# Patient Record
Sex: Female | Born: 1935 | Race: Black or African American | Hispanic: No | Smoking: Never smoker
Health system: Southern US, Community
[De-identification: ages and names within clinical notes are randomized; demographics above are authoritative.]

## PROBLEM LIST (undated history)

## (undated) DIAGNOSIS — R0602 Shortness of breath: Secondary | ICD-10-CM

## (undated) DIAGNOSIS — J069 Acute upper respiratory infection, unspecified: Secondary | ICD-10-CM

## (undated) DIAGNOSIS — R079 Chest pain, unspecified: Secondary | ICD-10-CM

## (undated) DIAGNOSIS — I739 Peripheral vascular disease, unspecified: Secondary | ICD-10-CM

## (undated) DIAGNOSIS — R51 Headache: Secondary | ICD-10-CM

## (undated) DIAGNOSIS — E049 Nontoxic goiter, unspecified: Secondary | ICD-10-CM

## (undated) DIAGNOSIS — R52 Pain, unspecified: Secondary | ICD-10-CM

## (undated) DIAGNOSIS — I10 Essential (primary) hypertension: Secondary | ICD-10-CM

## (undated) DIAGNOSIS — R2681 Unsteadiness on feet: Secondary | ICD-10-CM

## (undated) DIAGNOSIS — I209 Angina pectoris, unspecified: Secondary | ICD-10-CM

## (undated) DIAGNOSIS — I219 Acute myocardial infarction, unspecified: Secondary | ICD-10-CM

## (undated) DIAGNOSIS — H409 Unspecified glaucoma: Secondary | ICD-10-CM

## (undated) DIAGNOSIS — H93A9 Pulsatile tinnitus, unspecified ear: Secondary | ICD-10-CM

## (undated) DIAGNOSIS — E785 Hyperlipidemia, unspecified: Secondary | ICD-10-CM

## (undated) DIAGNOSIS — R002 Palpitations: Secondary | ICD-10-CM

## (undated) DIAGNOSIS — I82409 Acute embolism and thrombosis of unspecified deep veins of unspecified lower extremity: Secondary | ICD-10-CM

## (undated) DIAGNOSIS — I639 Cerebral infarction, unspecified: Secondary | ICD-10-CM

## (undated) DIAGNOSIS — I272 Pulmonary hypertension, unspecified: Secondary | ICD-10-CM

## (undated) DIAGNOSIS — I951 Orthostatic hypotension: Secondary | ICD-10-CM

## (undated) HISTORY — DX: Pulsatile tinnitus, unspecified ear: H93.A9

## (undated) HISTORY — PX: VASCULAR SURGERY: SHX849

## (undated) HISTORY — DX: Unsteadiness on feet: R26.81

## (undated) HISTORY — DX: Orthostatic hypotension: I95.1

## (undated) HISTORY — DX: Essential (primary) hypertension: I10

## (undated) HISTORY — DX: Acute myocardial infarction, unspecified: I21.9

## (undated) HISTORY — DX: Palpitations: R00.2

## (undated) HISTORY — DX: Hyperlipidemia, unspecified: E78.5

## (undated) HISTORY — DX: Angina pectoris, unspecified: I20.9

## (undated) HISTORY — DX: Acute embolism and thrombosis of unspecified deep veins of unspecified lower extremity: I82.409

## (undated) HISTORY — DX: Chest pain, unspecified: R07.9

## (undated) HISTORY — PX: EYE SURGERY: SHX253

## (undated) HISTORY — PX: APPENDECTOMY: SHX54

## (undated) HISTORY — DX: Peripheral vascular disease, unspecified: I73.9

## (undated) HISTORY — DX: Pulmonary hypertension, unspecified: I27.20

---

## 1986-01-05 HISTORY — PX: BACK SURGERY: SHX140

## 2004-06-11 ENCOUNTER — Encounter: Admission: RE | Admit: 2004-06-11 | Discharge: 2004-06-11 | Payer: Self-pay | Admitting: Family Medicine

## 2006-03-30 ENCOUNTER — Encounter: Admission: RE | Admit: 2006-03-30 | Discharge: 2006-05-13 | Payer: Self-pay | Admitting: Family Medicine

## 2006-07-07 ENCOUNTER — Other Ambulatory Visit: Admission: RE | Admit: 2006-07-07 | Discharge: 2006-07-07 | Payer: Self-pay | Admitting: Family Medicine

## 2006-08-19 ENCOUNTER — Encounter: Admission: RE | Admit: 2006-08-19 | Discharge: 2006-08-19 | Payer: Self-pay | Admitting: Family Medicine

## 2007-07-19 ENCOUNTER — Encounter: Admission: RE | Admit: 2007-07-19 | Discharge: 2007-07-19 | Payer: Self-pay | Admitting: Cardiology

## 2007-07-22 ENCOUNTER — Inpatient Hospital Stay (HOSPITAL_BASED_OUTPATIENT_CLINIC_OR_DEPARTMENT_OTHER): Admission: RE | Admit: 2007-07-22 | Discharge: 2007-07-22 | Payer: Self-pay | Admitting: Cardiology

## 2007-09-22 ENCOUNTER — Emergency Department (HOSPITAL_COMMUNITY): Admission: EM | Admit: 2007-09-22 | Discharge: 2007-09-22 | Payer: Self-pay | Admitting: Family Medicine

## 2008-09-12 ENCOUNTER — Encounter: Admission: RE | Admit: 2008-09-12 | Discharge: 2008-09-12 | Payer: Self-pay | Admitting: Family Medicine

## 2008-10-04 ENCOUNTER — Ambulatory Visit (HOSPITAL_COMMUNITY): Admission: RE | Admit: 2008-10-04 | Discharge: 2008-10-04 | Payer: Self-pay | Admitting: Interventional Cardiology

## 2009-06-01 ENCOUNTER — Emergency Department (HOSPITAL_COMMUNITY): Admission: EM | Admit: 2009-06-01 | Discharge: 2009-06-02 | Payer: Self-pay | Admitting: Emergency Medicine

## 2009-09-18 ENCOUNTER — Encounter: Admission: RE | Admit: 2009-09-18 | Discharge: 2009-09-18 | Payer: Self-pay | Admitting: Family Medicine

## 2009-10-04 ENCOUNTER — Encounter: Admission: RE | Admit: 2009-10-04 | Discharge: 2009-10-04 | Payer: Self-pay | Admitting: Family Medicine

## 2010-01-05 HISTORY — PX: CARDIAC CATHETERIZATION: SHX172

## 2010-01-27 ENCOUNTER — Encounter: Payer: Self-pay | Admitting: Family Medicine

## 2010-03-20 ENCOUNTER — Ambulatory Visit (HOSPITAL_COMMUNITY)
Admission: RE | Admit: 2010-03-20 | Discharge: 2010-03-20 | Disposition: A | Discharge status: Home / Self Care | Payer: Medicare Other | Source: Ambulatory Visit | Attending: Interventional Cardiology | Admitting: Interventional Cardiology

## 2010-03-20 DIAGNOSIS — I2510 Atherosclerotic heart disease of native coronary artery without angina pectoris: Secondary | ICD-10-CM | POA: Insufficient documentation

## 2010-03-20 DIAGNOSIS — I10 Essential (primary) hypertension: Secondary | ICD-10-CM | POA: Insufficient documentation

## 2010-03-20 DIAGNOSIS — I70219 Atherosclerosis of native arteries of extremities with intermittent claudication, unspecified extremity: Secondary | ICD-10-CM | POA: Insufficient documentation

## 2010-03-20 DIAGNOSIS — T82898A Other specified complication of vascular prosthetic devices, implants and grafts, initial encounter: Secondary | ICD-10-CM | POA: Insufficient documentation

## 2010-03-20 DIAGNOSIS — Y849 Medical procedure, unspecified as the cause of abnormal reaction of the patient, or of later complication, without mention of misadventure at the time of the procedure: Secondary | ICD-10-CM | POA: Insufficient documentation

## 2010-03-20 DIAGNOSIS — I252 Old myocardial infarction: Secondary | ICD-10-CM | POA: Insufficient documentation

## 2010-03-20 LAB — POCT ACTIVATED CLOTTING TIME
Activated Clotting Time: 229 seconds
Activated Clotting Time: 240 seconds
Activated Clotting Time: 199 seconds
Activated Clotting Time: 187 seconds
Activated Clotting Time: 164 seconds

## 2010-03-24 LAB — CBC
HCT: 39.1 % (ref 36.0–46.0)
Hemoglobin: 13.1 g/dL (ref 12.0–15.0)
MCHC: 33.5 g/dL (ref 30.0–36.0)
MCV: 86.4 fL (ref 78.0–100.0)
Platelets: 310 10*3/uL (ref 150–400)
RBC: 4.53 MIL/uL (ref 3.87–5.11)
RDW: 15.5 % (ref 11.5–15.5)
WBC: 5.4 10*3/uL (ref 4.0–10.5)

## 2010-03-24 LAB — BASIC METABOLIC PANEL
BUN: 7 mg/dL (ref 6–23)
CO2: 28 mEq/L (ref 19–32)
Calcium: 9 mg/dL (ref 8.4–10.5)
Chloride: 108 mEq/L (ref 96–112)
Creatinine, Ser: 0.8 mg/dL (ref 0.4–1.2)
GFR calc Af Amer: >60 mL/min (ref >60)
GFR calc non Af Amer: >60 mL/min (ref >60)
Glucose, Bld: 101 mg/dL — ABNORMAL HIGH (ref 70–99)
Potassium: 3.5 mEq/L (ref 3.5–5.1)
Sodium: 141 mEq/L (ref 135–145)

## 2010-03-24 LAB — GRAM STAIN

## 2010-03-24 LAB — PROTEIN AND GLUCOSE, CSF
Glucose, CSF: 61 mg/dL (ref 43–76)
Total  Protein, CSF: 35 mg/dL (ref 15–45)

## 2010-03-24 LAB — DIFFERENTIAL
Basophils Absolute: 0 10*3/uL (ref 0.0–0.1)
Basophils Relative: 0 % (ref 0–1)
Eosinophils Absolute: 0.1 10*3/uL (ref 0.0–0.7)
Eosinophils Relative: 1 % (ref 0–5)
Lymphocytes Relative: 21 % (ref 12–46)
Lymphs Abs: 1.1 10*3/uL (ref 0.7–4.0)
Monocytes Absolute: 0.4 10*3/uL (ref 0.1–1.0)
Monocytes Relative: 7 % (ref 3–12)
Neutro Abs: 3.9 10*3/uL (ref 1.7–7.7)
Neutrophils Relative %: 71 % (ref 43–77)

## 2010-03-24 LAB — SEDIMENTATION RATE: Sed Rate: 23 mm/hr — ABNORMAL HIGH (ref 0–22)

## 2010-03-24 LAB — CSF CELL COUNT WITH DIFFERENTIAL
RBC Count, CSF: 1 /mm3 — ABNORMAL HIGH
RBC Count, CSF: 39 /mm3 — ABNORMAL HIGH
Tube #: 1
Tube #: 4
WBC, CSF: 1 /mm3 (ref 0–5)
WBC, CSF: 2 /mm3 (ref 0–5)

## 2010-03-24 LAB — PROTIME-INR
INR: 1.05 (ref 0.00–1.49)
Prothrombin Time: 13.6 seconds (ref 11.6–15.2)

## 2010-03-24 LAB — APTT: aPTT: 33 seconds (ref 24–37)

## 2010-03-24 LAB — CSF CULTURE W GRAM STAIN
Culture: NO GROWTH
Gram Stain: NONE SEEN

## 2010-03-24 LAB — POCT CARDIAC MARKERS
CKMB, poc: 1.7 ng/mL (ref 1.0–8.0)
Myoglobin, poc: 90.8 ng/mL (ref 12–200)
Troponin i, poc: <0.05 ng/mL (ref 0.00–0.09)

## 2010-04-09 NOTE — Procedures (Signed)
NAME:  Yvonne Tran, Yvonne Tran NO.:  [id number]  MEDICAL RECORD NO.:  [id number]           PATIENT TYPE:  O  LOCATION:  [facility]                         FACILITY:  [facility]  PHYSICIAN:  Corky Crafts, MDDATE OF BIRTH:  09-16-1934  DATE OF PROCEDURE:  03/20/2010 DATE OF DISCHARGE:  03/20/2010                   PERIPHERAL VASCULAR INVASIVE PROCEDURE   PRIMARY CARE PHYSICIAN:  Pam Drown, MD  PROCEDURES PERFORMED:  Left heart catheterization, left ventriculogram, coronary angiogram, abdominal aortogram, pelvic angiogram, bilateral lower extremity runoff, selective angiogram of left common iliac artery, PTA/stent of the left external iliac artery.  PROCEDURE:  The patient was brought to the [facility].  She was prepped and draped in usual sterile fashion.  Her right groin was infiltrated with 1% lidocaine.  A 5-French sheath was placed into the right common femoral artery using the modified Seldinger technique.  Left coronary artery angiography was performed using a JL4.0 catheter.  Catheter was advanced to the vessel ostium under fluoroscopic guidance.  Digital angiography was performed in multiple projections using hand injection of contrast.  Right coronary artery angiography was performed using a JR- 4 catheter in similar fashion.  A pigtail catheter was advanced to the ascending aorta and across the aortic valve under fluoroscopic guidance. Power injection of contrast was performed in the RAO projection to image the left ventricle.  Catheter was pulled back in a continuous hemodynamic pressure monitoring, was withdrawn to the abdominal aorta. Power injection of contrast was performed there.  The catheter was then pulled back to the aortoiliac bifurcation and power injection of contrast was performed to image the pelvic vasculature.  Subsequently lower extremity runoff was performed through this catheter.  The cath was removed, 5-French sheath was changed out for a  6-French destination sheath after access was obtained over the aortoiliac bifurcation into the left iliac system.  A selective angiogram from the left common iliac was performed to see the prior stent in the left external iliac, the intervention was then performed.  Please see below for details.  The sheath was removed using manual compression.  FINDINGS:  Left main coronary tract was widely patent. Left circumflex was a large vessel, proximally the OM1 was large and appeared angiographically normal.  The remainder of the circumflex is medium-sized and widely patent. Left anterior descending was a large vessel with mild irregularities. The first diagonal was medium-sized, second diagonal was small.  The entire LAD system appeared widely patent. The right coronary artery was a large dominant vessel and appeared angiographically normal.  Posterior descending artery and posterolateral artery where medium-sized vessel and widely patent. Left ventriculogram showed normal left ventricular function.  HEMODYNAMICS:  Left ventricular pressure 182/14, LVEDP of 25 mmHg. Aortic pressure 180/81 with a mean aortic pressure of 122 mmHg.  Left ventricular ejection fraction estimated at 60%. Abdominal aortogram showed no abdominal aortic aneurysm for bilateral single renal arteries, both of which were widely patent. The pelvic angiogram showed that the aortoiliac bifurcation was widely patent. The right common iliac and internal iliac arteries are widely patent. Left common internal iliac artery was widely patent.  In the left external iliac artery, there was 90% in-stent restenosis in the prior stent.  Just proximal  to the stent, there was 80% stenosis in the external iliac artery.  The bilateral common femoral arteries are patent at the left common femoral bifurcation into the SFA, and profunda femoral, there is some mild atherosclerosis.  Bilaterally, the SFA appear widely patent.  Bilateral  popliteal arteries are patent. Below the right knee, the right anterior tibial is patent.  The right posterior tibial and peroneal are diseased. Below the left knee, there appears to be three-vessel runoff.  INTERVENTIONAL NARRATIVE:  A 6-French Destination sheath was used to cross over the aortoiliac bifurcation.  A Versacore wire was placed across the stenosis in the left external iliac.  The diseased area was dilated with a 7 x 4 Armada balloon to 8 atmospheres, a 9 x 40 Absolute stent was then deployed and postdilated with the same 7 x 4 Armada balloon.  There was some overlap with the prior stent.  There was no residual stenosis.  IMPRESSION: 1. In-stent restenosis of the left external iliac stent. 2. Successful re-stent with a 9 x 40 Absolute stent postdilated to     greater than 7 atmospheres in diameter. 3. No significant coronary artery disease. 4. Normal left ventricular function. 5. No abdominal aortic aneurysm. 6. Mildly increased left ventricular end-diastolic pressure. 7. No other significant lower extremity arterial disease.  There is     some moderate disease below the right knee.  RECOMMENDATIONS:  Continue aspirin and Plavix for a minimum of 30 days, likely longer.  She may be able to be discharged later today after bedrest is over.  Continue aggressive secondary prevention.     Corky Crafts, MD     JSV/MEDQ  D:  03/27/2010  T:  03/28/2010  Job:  [id number]  Electronically Signed by Lance Muss MD on 04/09/2010 09:11:22 AM

## 2010-04-19 ENCOUNTER — Emergency Department (HOSPITAL_COMMUNITY)
Admission: EM | Admit: 2010-04-19 | Discharge: 2010-04-20 | Disposition: A | Discharge status: Home / Self Care | Payer: Medicare Other | Attending: Emergency Medicine | Admitting: Emergency Medicine

## 2010-04-19 DIAGNOSIS — I739 Peripheral vascular disease, unspecified: Secondary | ICD-10-CM | POA: Insufficient documentation

## 2010-04-19 DIAGNOSIS — M549 Dorsalgia, unspecified: Secondary | ICD-10-CM | POA: Insufficient documentation

## 2010-04-19 DIAGNOSIS — Z79899 Other long term (current) drug therapy: Secondary | ICD-10-CM | POA: Insufficient documentation

## 2010-04-19 DIAGNOSIS — H543 Unqualified visual loss, both eyes: Secondary | ICD-10-CM | POA: Insufficient documentation

## 2010-04-19 DIAGNOSIS — R071 Chest pain on breathing: Secondary | ICD-10-CM | POA: Insufficient documentation

## 2010-04-19 DIAGNOSIS — Z8673 Personal history of transient ischemic attack (TIA), and cerebral infarction without residual deficits: Secondary | ICD-10-CM | POA: Insufficient documentation

## 2010-04-19 DIAGNOSIS — R0602 Shortness of breath: Secondary | ICD-10-CM | POA: Insufficient documentation

## 2010-04-19 DIAGNOSIS — M25519 Pain in unspecified shoulder: Secondary | ICD-10-CM | POA: Insufficient documentation

## 2010-04-19 DIAGNOSIS — I10 Essential (primary) hypertension: Secondary | ICD-10-CM | POA: Insufficient documentation

## 2010-04-19 DIAGNOSIS — G8929 Other chronic pain: Secondary | ICD-10-CM | POA: Insufficient documentation

## 2010-04-19 DIAGNOSIS — Z7982 Long term (current) use of aspirin: Secondary | ICD-10-CM | POA: Insufficient documentation

## 2010-04-19 DIAGNOSIS — I2510 Atherosclerotic heart disease of native coronary artery without angina pectoris: Secondary | ICD-10-CM | POA: Insufficient documentation

## 2010-04-20 ENCOUNTER — Emergency Department (HOSPITAL_COMMUNITY): Payer: Medicare Other

## 2010-04-20 LAB — CBC
HCT: 35.9 % — ABNORMAL LOW (ref 36.0–46.0)
Hemoglobin: 12.1 g/dL (ref 12.0–15.0)
MCH: 28.2 pg (ref 26.0–34.0)
MCHC: 33.7 g/dL (ref 30.0–36.0)
MCV: 83.7 fL (ref 78.0–100.0)
Platelets: 293 10*3/uL (ref 150–400)
RBC: 4.29 MIL/uL (ref 3.87–5.11)
RDW: 15.2 % (ref 11.5–15.5)
WBC: 7 10*3/uL (ref 4.0–10.5)

## 2010-04-20 LAB — POCT I-STAT, CHEM 8
BUN: 16 mg/dL (ref 6–23)
Calcium, Ion: 1.08 mmol/L — ABNORMAL LOW (ref 1.12–1.32)
Chloride: 107 mEq/L (ref 96–112)
Creatinine, Ser: 1 mg/dL (ref 0.4–1.2)
Glucose, Bld: 105 mg/dL — ABNORMAL HIGH (ref 70–99)
HCT: 37 % (ref 36.0–46.0)
Hemoglobin: 12.6 g/dL (ref 12.0–15.0)
Potassium: 3.7 mEq/L (ref 3.5–5.1)
Sodium: 141 mEq/L (ref 135–145)
TCO2: 25 mmol/L (ref 0–100)

## 2010-04-20 LAB — POCT CARDIAC MARKERS
CKMB, poc: 1.6 ng/mL (ref 1.0–8.0)
Myoglobin, poc: 118 ng/mL (ref 12–200)
Troponin i, poc: <0.05 ng/mL (ref 0.00–0.09)

## 2010-04-20 LAB — DIFFERENTIAL
Basophils Absolute: 0 10*3/uL (ref 0.0–0.1)
Basophils Relative: 1 % (ref 0–1)
Eosinophils Absolute: 0.1 10*3/uL (ref 0.0–0.7)
Eosinophils Relative: 2 % (ref 0–5)
Lymphocytes Relative: 35 % (ref 12–46)
Lymphs Abs: 2.5 10*3/uL (ref 0.7–4.0)
Monocytes Absolute: 0.7 10*3/uL (ref 0.1–1.0)
Monocytes Relative: 11 % (ref 3–12)
Neutro Abs: 3.6 10*3/uL (ref 1.7–7.7)
Neutrophils Relative %: 51 % (ref 43–77)

## 2010-04-20 LAB — CK TOTAL AND CKMB (NOT AT ARMC)
CK, MB: 1.8 ng/mL (ref 0.3–4.0)
Relative Index: 0.8 (ref 0.0–2.5)
Total CK: 232 U/L — ABNORMAL HIGH (ref 7–177)

## 2010-04-20 LAB — TROPONIN I: Troponin I: 0.01 ng/mL (ref 0.00–0.06)

## 2010-04-20 LAB — PROTIME-INR
INR: 0.96 (ref 0.00–1.49)
Prothrombin Time: 13 seconds (ref 11.6–15.2)

## 2010-04-20 LAB — APTT: aPTT: 35 seconds (ref 24–37)

## 2010-04-22 ENCOUNTER — Ambulatory Visit: Payer: Medicare Other | Attending: Family Medicine | Admitting: Physical Therapy

## 2010-04-22 DIAGNOSIS — M6281 Muscle weakness (generalized): Secondary | ICD-10-CM | POA: Insufficient documentation

## 2010-04-22 DIAGNOSIS — IMO0001 Reserved for inherently not codable concepts without codable children: Secondary | ICD-10-CM | POA: Insufficient documentation

## 2010-04-22 DIAGNOSIS — M2560 Stiffness of unspecified joint, not elsewhere classified: Secondary | ICD-10-CM | POA: Insufficient documentation

## 2010-04-22 DIAGNOSIS — R262 Difficulty in walking, not elsewhere classified: Secondary | ICD-10-CM | POA: Insufficient documentation

## 2010-04-29 ENCOUNTER — Ambulatory Visit: Payer: Medicare Other | Admitting: Physical Therapy

## 2010-05-06 ENCOUNTER — Ambulatory Visit: Payer: Medicare Other | Attending: Family Medicine | Admitting: Physical Therapy

## 2010-05-06 DIAGNOSIS — IMO0001 Reserved for inherently not codable concepts without codable children: Secondary | ICD-10-CM | POA: Insufficient documentation

## 2010-05-06 DIAGNOSIS — M2560 Stiffness of unspecified joint, not elsewhere classified: Secondary | ICD-10-CM | POA: Insufficient documentation

## 2010-05-06 DIAGNOSIS — M6281 Muscle weakness (generalized): Secondary | ICD-10-CM | POA: Insufficient documentation

## 2010-05-06 DIAGNOSIS — R262 Difficulty in walking, not elsewhere classified: Secondary | ICD-10-CM | POA: Insufficient documentation

## 2010-05-13 ENCOUNTER — Encounter: Payer: Medicare Other | Admitting: Physical Therapy

## 2010-05-15 ENCOUNTER — Ambulatory Visit: Payer: Medicare Other | Admitting: Physical Therapy

## 2010-05-20 ENCOUNTER — Encounter: Payer: Medicare Other | Admitting: Physical Therapy

## 2010-05-20 NOTE — Cardiovascular Report (Signed)
NAMEENYAH, MOMAN NO.:  [id number]   MEDICAL RECORD NO.:  [id number]          PATIENT TYPE:  OIB   LOCATION:  1963                         FACILITY:  [facility]   PHYSICIAN:  Armanda Magic, M.D.     DATE OF BIRTH:  [birth year]   DATE OF PROCEDURE:  07/22/2007  DATE OF DISCHARGE:  07/22/2007                            CARDIAC CATHETERIZATION   REFERRING PHYSICIAN:  Pam Drown, MD.   PROCEDURES:  1. Left heart catheterization.  2. Coronary angiography.  3. Left ventriculography.   OPERATOR:  Armanda Magic, MD   INDICATIONS:  Chest pain.   COMPLICATIONS:  None.   IV ACCESS:  Via right femoral artery, 4-French sheath.   IV MEDICATIONS:  1. Versed 1 mg  2. Fentanyl 25 mcg.   This is a 76 year old African American female who has been having  episodic chest pain and now presents for cardiac catheterization.   The patient was brought to the Cardiac Catheterization Laboratory in a  fasting nonsedated state.  Informed consent was obtained.  The patient  was connected to a continuous heart rate and pulse oximetry monitoring  and intermittent blood pressure monitoring.  The right groin was prepped  and draped in a sterile fashion.  Xylocaine 1% was used for local  anesthesia.  Using a modified Seldinger technique, a 4-French sheath was  placed in the right femoral artery.  Under the fluoroscopic guidance, a  4-French JR-4 catheter was placed in the left coronary artery.  Multiple  cine films were taken at 30-degree RAO and 40-degree LAO views.  This  catheter was then exchanged out over a guidewire for a 4-French 3-D RCA  catheter which successfully engaged the coronary ostium.  Multiple cine  films were taken at 30-degree RAO and 40-degree LAO views.  This  catheter was exchanged out over a guidewire for a 4-French angled  pigtail catheter and it was placed under fluoroscopic guidance in the  left ventricular cavity.  The left ventriculography was performed  in the  30-degree RAO view using a total of 30 mL of contrast at 15 mL per  second.  The catheter was then pulled back across the aortic valve with  no significant gradient noted.  At the end of procedure, all catheters  and sheaths were removed.  Manual compression was performed until  adequate hemostasis was obtained.  The patient was transferred back to  room in stable condition.   RESULTS:  The left main coronary artery is widely patent.  It bifurcates  to left anterior descending artery and left circumflex artery.   The left anterior descending artery is widely patent throughout its  course of the apex.  The LAD gives off a first diagonal, which is  moderate size and is widely patent and bifurcates into daughter vessels  both of which are widely patent.  The ongoing LAD gives off a second  diagonal branch, which is patent but small.   The left circumflex is widely patent throughout its course in the AV  groove, giving rise to a very large obtuse marginal 1 branch, which is  widely patent.  It bifurcates into daughter branches both  of which are  widely patent.   The right coronary artery is widely patent throughout its course and  distally bifurcates into posterior descending artery and posterior  lateral artery both of which are widely patent.   Left ventriculography was performed in the 3-0 degree RAO view using a  total of 30 mL of contrast at 15 mL per second.  LV function appeared  normal with no regional wall motion.  LV pressure 163/60 mmHg, aortic  pressure 163/72 mmHg, LVEDP 19 mmHg, EF 60%.   ASSESSMENT:  1. Normal coronary arteries.  2. Normal left ventricular function.  3. Noncardiac chest pain.  4. Elevated left ventricular end-diastolic pressure consistent with      mild diastolic relaxation abnormality.   PLAN:  1. We will continue Norvasc for blood pressure control, but add Altace      5 mg a day to get blood pressure under better control.  It was       elevated as well in our office and this will also help with      diastolic dysfunction.  I cannot put her on beta-blocker at this      time because her heart rate is 60.  2. She will follow up with my nurse practitioner in my office in 2      weeks for groin check and blood pressure check, and she will follow      up with her primary physician for further workup of chronic chest      pain.      Armanda Magic, M.D.  Electronically Signed     TT/MEDQ  D:  07/22/2007  T:  07/22/2007  Job:  [id number]   cc:   Pam Drown, M.D.

## 2010-05-20 NOTE — Procedures (Signed)
NAMEFREDRICA, Yvonne Tran NO.:  [id number]   MEDICAL RECORD NO.:  [id number]          PATIENT TYPE:  AMB   LOCATION:  [facility]                          FACILITY:  [facility]   PHYSICIAN:  Corky Crafts, MDDATE OF BIRTH:  [birth year]   DATE OF PROCEDURE:  10/04/2008  DATE OF DISCHARGE:                    PERIPHERAL VASCULAR INVASIVE PROCEDURE   REFERRING PHYSICIAN:  Pam Drown, M.D.   PROCEDURES PERFORMED:  1. Abdominal aortogram.  2. Pelvic angiogram.  3. Right lower extremity angiogram.  4. Left lower extremity angiogram.  5. Selective left external iliac angiogram.  6. PTA/stent of the left external iliac artery.   OPERATOR:  Corky Crafts, MD   INDICATIONS:  Left leg claudication, abnormal Doppler study.   PROCEDURE NARRATIVE:  The risks and benefits of a PV procedure were  explained to the patient and informed consent was obtained.  She was  brought to the [facility] lab.  She was prepped and draped in the usual sterile  fashion.  Her right groin was infiltrated with 1% lidocaine.  A 5-French  sheath was placed into the right femoral artery using modified Seldinger  technique.  A pigtail catheter was advanced to the abdominal aorta and  power injection with contrast was performed in the AP projection.  The  catheter was withdrawn to the aortoiliac bifurcation and power injection  of contrast was performed to visualize the pelvic vasculature.  Power  injection was then performed through the right femoral sheath to image  the right leg.  A crossover catheter was inserted and a Wholey wire was  placed down the left iliac system.  A 7-French sheath was placed over  the [facility] wire into the left external iliac artery.  A left lower  extremity runoff was then performed through this sheath using the bolus-  chase method.  The intervention to the left external iliac was then  performed.  Please see below for details.  Several doses of labetalol  were given during  the case to lower her blood pressure.  Heparin was  administered during the intervention.  ACT was confirmed to be  therapeutic.  The sheath will be removed using manual compression.   FINDINGS:  The abdominal aorta has mild atherosclerosis.  No abdominal  aortic aneurysm.  There are bilateral single renal arteries both of  which appear widely patent.  The aortoiliac bifurcation appears widely  patent.  The right leg shows that the common iliac is widely patent.  The right internal iliac artery is widely patent.  The right external  iliac artery is widely patent with some mild irregularities.  There is  mild plaque noted in the right common femoral artery.  The right  profunda femoral artery is patent.  The right superficial femoral artery  is widely patent.  The right popliteal artery is widely patent.  Below  the knee the right anterior tibial artery is widely patent.  The right  posterior tibial has a proximal 80% stenosis.  The right peroneal artery  is small but widely patent.   The left lower extremity vasculature:  The left common iliac is widely  patent.  The left internal iliac has some mild atherosclerosis  but is  patent.  The left external iliac artery has a 90% stenosis in the mid  vessel.  The left common femoral artery has some mild plaque.  The left  profunda femoral artery is patent.  The left superficial femoral artery  is widely patent.  The left popliteal is widely patent.  Below the knee  there is three-vessel runoff which is all widely patent.   PTA NARRATIVE:  A 7-French sheath was used and the tip of it placed in  the left external iliac artery.  A Wholey wire was used to cross the  lesion.  A 9 mm x 4 cm SMART stent was deployed across the lesion.  It  was post dilated with a 7 mm x 3 cm Powerflex inflated to 10 atmospheres  for 30 seconds and then more proximally to 6 atmospheres for 30 seconds.  There still appeared to be some residual stenosis at the  tightest  places.  The balloon was readvanced and inflated to 14 atmospheres for  45 seconds.  There was an excellent angiographic result with no residual  stenosis.  The aortoiliac bifurcation appeared intact after the sheath  was withdrawn.   IMPRESSION:  1. Severe left external iliac artery stenosis.  This was successfully      stented with a 9.0 mm x 4 cm SMART stent and post dilated with a 7      mm x 3 cm Powerflex balloon to 14 atmospheres.  2. The right lower extremity vasculature is widely patent with the      exception of an 80% proximal posterior tibial artery stenosis.  3. The remainder of the left lower extremity vasculature is widely      patent.   RECOMMENDATIONS:  Continue aspirin indefinitely.  She will take Plavix  for at least 30 days.  Continue aggressive blood pressure control.  Will  plan on increasing Norvasc to 10 mg daily.      Corky Crafts, MD  Electronically Signed     JSV/MEDQ  D:  10/04/2008  T:  10/04/2008  Job:  [id number]

## 2010-05-22 ENCOUNTER — Encounter: Payer: Medicare Other | Admitting: Physical Therapy

## 2010-10-20 ENCOUNTER — Inpatient Hospital Stay (INDEPENDENT_AMBULATORY_CARE_PROVIDER_SITE_OTHER)
Admission: RE | Admit: 2010-10-20 | Discharge: 2010-10-20 | Disposition: A | Discharge status: Home / Self Care | Payer: Medicare Other | Source: Ambulatory Visit | Attending: Family Medicine | Admitting: Family Medicine

## 2010-10-20 ENCOUNTER — Encounter (INDEPENDENT_AMBULATORY_CARE_PROVIDER_SITE_OTHER): Payer: Medicare Other | Admitting: General Surgery

## 2010-10-20 DIAGNOSIS — S0510XA Contusion of eyeball and orbital tissues, unspecified eye, initial encounter: Secondary | ICD-10-CM

## 2010-11-10 ENCOUNTER — Encounter (INDEPENDENT_AMBULATORY_CARE_PROVIDER_SITE_OTHER): Payer: Self-pay | Admitting: General Surgery

## 2010-11-10 ENCOUNTER — Ambulatory Visit (INDEPENDENT_AMBULATORY_CARE_PROVIDER_SITE_OTHER): Payer: Medicare Other | Admitting: General Surgery

## 2010-11-10 ENCOUNTER — Other Ambulatory Visit (INDEPENDENT_AMBULATORY_CARE_PROVIDER_SITE_OTHER): Payer: Self-pay | Admitting: General Surgery

## 2010-11-10 VITALS — BP 148/86 | HR 76 | Temp 97.0°F | Resp 16 | Ht 64.0 in | Wt 217.6 lb

## 2010-11-10 DIAGNOSIS — D171 Benign lipomatous neoplasm of skin and subcutaneous tissue of trunk: Secondary | ICD-10-CM

## 2010-11-10 DIAGNOSIS — R1902 Left upper quadrant abdominal swelling, mass and lump: Secondary | ICD-10-CM | POA: Insufficient documentation

## 2010-11-10 NOTE — Patient Instructions (Signed)
Procedure will be outpatient. Will take about 1 hour. You will need someone to bring you and take you home from hospital as well as stay with you overnight.    The most common risks are bleeding and infection. Because of the size of the mass, you may notice a divot in the skin and you may need a drain after surgery.    You will need to stop aspirin 1 week before surgery.

## 2010-11-10 NOTE — Progress Notes (Signed)
Subjective:     Patient ID: Yvonne Tran, female   DOB: September 19, 1934, 76 y.o.   MRN: [id number]  HPI Pt is a 76 year old female who has had a mass in the abdominal wall around 14 months.  It has been enlarging since she noticed it.  She saw Dr. Freida Busman in May, and never got surgery scheduled.  She continues to have pain in that area and cannot sleep on that side.  She has not had any infection or drainage.  The discomfort is around a 4-5/10.    Review of Systems  Eyes: Positive for visual disturbance.  Cardiovascular: Positive for chest pain.  Gastrointestinal: Positive for abdominal pain.  Musculoskeletal: Positive for arthralgias.  Neurological: Positive for weakness.       Past Medical History  Diagnosis Date  . Hyperlipidemia   . Hypertension   . Heart attack   . Lipoma   . Cardiac angina     Past Surgical History  Procedure Date  . Cardiac catheterization 2012  . Eye surgery     left eye, lens replacement    Current outpatient prescriptions:amLODipine (NORVASC) 10 MG tablet, Take 10 mg by mouth daily.  , Disp: , Rfl: ;  hydrochlorothiazide (MICROZIDE) 12.5 MG capsule, Take 12.5 mg by mouth daily.  , Disp: , Rfl: ;  ketorolac (ACULAR) 0.5 % ophthalmic solution, 1 drop 4 (four) times daily.  , Disp: , Rfl: ;  nitroGLYCERIN 2.5 MG CR capsule, Take 2.5 mg by mouth as needed.  , Disp: , Rfl:     Allergies  Allergen Reactions  . Eggs Or Egg-Derived Products   . Lemon Juice   . Milk-Related Compounds   . Shellfish Allergy     Family History  Problem Relation Age of Onset  . Cancer Mother     brain  . Cancer Brother     prostate    History   Social History  . Marital Status: Single    Spouse Name: N/A    Number of Children: N/A  . Years of Education: N/A   Occupational History  . Not on file.   Social History Main Topics  . Smoking status: Never Smoker   . Smokeless tobacco: Not on file  . Alcohol Use: No  . Drug Use: No  . Sexually Active:     Other Topics Concern  . Not on file   Social History Narrative  . No narrative on file     Objective:   Physical Exam  Constitutional: She is oriented to person, place, and time. She appears well-developed and well-nourished. No distress.  HENT:  Head: Normocephalic and atraumatic.  Eyes: Conjunctivae are normal. Pupils are equal, round, and reactive to light. No scleral icterus.  Neck: Normal range of motion. Neck supple. No tracheal deviation present.  Cardiovascular: Normal rate, regular rhythm and normal heart sounds.  Exam reveals no gallop and no friction rub.   No murmur heard. Pulmonary/Chest: Effort normal and breath sounds normal. No respiratory distress. She has no wheezes. She has no rales. She exhibits no tenderness.  Abdominal: Soft. She exhibits mass. She exhibits no distension. There is no tenderness. There is no rebound and no guarding.         4 x 5 cm mass  Musculoskeletal: Normal range of motion. She exhibits no edema and no tenderness.  Neurological: She is alert and oriented to person, place, and time.  Skin: Skin is warm and dry. No rash noted. She is not  diaphoretic. No erythema. No pallor.  Psychiatric: She has a normal mood and affect. Her behavior is normal. Judgment and thought content normal.       Assessment/Plan:     Abdominal wall mass of left upper quadrant, 4 x 5 cm in anterior axillary line. Mass is symptomatic. Will plan to resect if cardiac clearance is obtainable.  She has angina around once per month, and this may not be forthcoming.   She will need to be off aspirin for around a week.   I do not think this would be reasonable to do under local and MAC due to the depth of the lesion.  It feels as though is is stuck in the muscular abdominal wall. We will schedule surgery once we hear from her cardiologist.   She was advised that these mass excisions can cause a divot to be present in the skin.   Also, due to the size of the mass, we may  need to leave a surgical drain. I advised her that this is an outpatient procedure that would likely take around an hour.   We also discussed bleeding and infection.

## 2010-11-10 NOTE — Assessment & Plan Note (Signed)
Mass is symptomatic. Will plan to resect if cardiac clearance is obtainable.  She has angina around once per month, and this may not be forthcoming.   She will need to be off aspirin for around a week.   I do not think this would be reasonable to do under local and MAC due to the depth of the lesion.  It feels as though is is stuck in the muscular abdominal wall. We will schedule surgery once we hear from her cardiologist.   She was advised that these mass excisions can cause a divot to be present in the skin.   Also, due to the size of the mass, we may need to leave a surgical drain. I advised her that this is an outpatient procedure that would likely take around an hour.   We also discussed bleeding and infection.

## 2010-12-08 ENCOUNTER — Telehealth (INDEPENDENT_AMBULATORY_CARE_PROVIDER_SITE_OTHER): Payer: Self-pay

## 2010-12-08 NOTE — Telephone Encounter (Signed)
Called the pt to let her know we are waiting for clearance from Dr. Eldridge Dace.  I called their office today and requested it be faxed to our office.

## 2010-12-15 ENCOUNTER — Other Ambulatory Visit (INDEPENDENT_AMBULATORY_CARE_PROVIDER_SITE_OTHER): Payer: Self-pay | Admitting: General Surgery

## 2010-12-26 ENCOUNTER — Encounter (HOSPITAL_COMMUNITY): Payer: Self-pay

## 2011-01-09 ENCOUNTER — Encounter (HOSPITAL_COMMUNITY)
Admission: RE | Admit: 2011-01-09 | Discharge: 2011-01-09 | Disposition: A | Discharge status: Home / Self Care | Payer: Medicare Other | Source: Ambulatory Visit | Attending: General Surgery | Admitting: General Surgery

## 2011-01-09 ENCOUNTER — Encounter (HOSPITAL_COMMUNITY): Payer: Self-pay

## 2011-01-09 ENCOUNTER — Encounter (HOSPITAL_COMMUNITY)
Admission: RE | Admit: 2011-01-09 | Discharge: 2011-01-09 | Disposition: A | Discharge status: Home / Self Care | Payer: Medicare Other | Source: Ambulatory Visit | Attending: Anesthesiology | Admitting: Anesthesiology

## 2011-01-09 HISTORY — DX: Headache: R51

## 2011-01-09 HISTORY — DX: Acute upper respiratory infection, unspecified: J06.9

## 2011-01-09 HISTORY — DX: Shortness of breath: R06.02

## 2011-01-09 HISTORY — DX: Cerebral infarction, unspecified: I63.9

## 2011-01-09 LAB — CBC
HCT: 40 % (ref 36.0–46.0)
Hemoglobin: 13 g/dL (ref 12.0–15.0)
MCH: 27.9 pg (ref 26.0–34.0)
MCHC: 32.5 g/dL (ref 30.0–36.0)
MCV: 85.8 fL (ref 78.0–100.0)
Platelets: 436 10*3/uL — ABNORMAL HIGH (ref 150–400)
RBC: 4.66 MIL/uL (ref 3.87–5.11)
RDW: 15.9 % — ABNORMAL HIGH (ref 11.5–15.5)
WBC: 5.9 10*3/uL (ref 4.0–10.5)

## 2011-01-09 LAB — SURGICAL PCR SCREEN
MRSA, PCR: NEGATIVE
Staphylococcus aureus: NEGATIVE

## 2011-01-09 LAB — BASIC METABOLIC PANEL
BUN: 13 mg/dL (ref 6–23)
CO2: 30 mEq/L (ref 19–32)
Calcium: 9.6 mg/dL (ref 8.4–10.5)
Chloride: 104 mEq/L (ref 96–112)
Creatinine, Ser: 0.86 mg/dL (ref 0.50–1.10)
GFR calc Af Amer: 75 mL/min — ABNORMAL LOW (ref >90)
GFR calc non Af Amer: 65 mL/min — ABNORMAL LOW (ref >90)
Glucose, Bld: 101 mg/dL — ABNORMAL HIGH (ref 70–99)
Potassium: 4.3 mEq/L (ref 3.5–5.1)
Sodium: 142 mEq/L (ref 135–145)

## 2011-01-09 NOTE — Pre-Procedure Instructions (Signed)
20 Yvonne Tran  01/09/2011   Your procedure is scheduled on:  Tuesday 01/13/11  Report to [facility] at 800 AM.  Call this number if you have problems the morning of surgery: [phone]   Remember:   Do not eat food:After Midnight.  May have clear liquids: up to 4 Hours before arrival.  Clear liquids include soda, tea, black coffee, apple or grape juice, broth.  Take these medicines the morning of surgery with A SIP OF WATER: NORVASC, EYE DROPS,    Do not wear jewelry, make-up or nail polish.  Do not wear lotions, powders, or perfumes. You may wear deodorant.  Do not shave 48 hours prior to surgery.  Do not bring valuables to the hospital.  Contacts, dentures or bridgework may not be worn into surgery.  Leave suitcase in the car. After surgery it may be brought to your room.  For patients admitted to the hospital, checkout time is 11:00 AM the day of discharge.   Patients discharged the day of surgery will not be allowed to drive home.  Name and phone number of your driver:   Special Instructions: CHG Shower Use Special Wash: 1/2 bottle night before surgery and 1/2 bottle morning of surgery.   Please read over the following fact sheets that you were given: Pain Booklet, MRSA Information and Surgical Site Infection Prevention   STOP ASPIRIN, COUMADIN, PLAVIX, EFFIENT, HERBAL MEDICINES

## 2011-01-12 MED ORDER — CEFAZOLIN SODIUM-DEXTROSE 2-3 GM-% IV SOLR
2.0000 g | INTRAVENOUS | Status: AC
  Administered 2011-01-13: 2 g via INTRAVENOUS
  Filled 2011-01-12: qty 50

## 2011-01-13 ENCOUNTER — Encounter (HOSPITAL_COMMUNITY): Payer: Self-pay | Admitting: Anesthesiology

## 2011-01-13 ENCOUNTER — Encounter (HOSPITAL_COMMUNITY)
Admission: RE | Disposition: A | Discharge status: Home / Self Care | Payer: Self-pay | Source: Ambulatory Visit | Attending: General Surgery

## 2011-01-13 ENCOUNTER — Ambulatory Visit (HOSPITAL_COMMUNITY)
Admission: RE | Admit: 2011-01-13 | Discharge: 2011-01-13 | Disposition: A | Discharge status: Home / Self Care | Payer: Medicare Other | Source: Ambulatory Visit | Attending: General Surgery | Admitting: General Surgery

## 2011-01-13 ENCOUNTER — Encounter (HOSPITAL_COMMUNITY): Payer: Self-pay

## 2011-01-13 ENCOUNTER — Ambulatory Visit (HOSPITAL_COMMUNITY): Payer: Medicare Other | Admitting: Anesthesiology

## 2011-01-13 ENCOUNTER — Other Ambulatory Visit (INDEPENDENT_AMBULATORY_CARE_PROVIDER_SITE_OTHER): Payer: Self-pay | Admitting: General Surgery

## 2011-01-13 DIAGNOSIS — D1779 Benign lipomatous neoplasm of other sites: Secondary | ICD-10-CM | POA: Insufficient documentation

## 2011-01-13 DIAGNOSIS — I252 Old myocardial infarction: Secondary | ICD-10-CM | POA: Insufficient documentation

## 2011-01-13 DIAGNOSIS — R51 Headache: Secondary | ICD-10-CM | POA: Insufficient documentation

## 2011-01-13 DIAGNOSIS — K08109 Complete loss of teeth, unspecified cause, unspecified class: Secondary | ICD-10-CM | POA: Insufficient documentation

## 2011-01-13 DIAGNOSIS — I209 Angina pectoris, unspecified: Secondary | ICD-10-CM | POA: Insufficient documentation

## 2011-01-13 DIAGNOSIS — Z01812 Encounter for preprocedural laboratory examination: Secondary | ICD-10-CM | POA: Insufficient documentation

## 2011-01-13 DIAGNOSIS — R0602 Shortness of breath: Secondary | ICD-10-CM | POA: Insufficient documentation

## 2011-01-13 DIAGNOSIS — Z01818 Encounter for other preprocedural examination: Secondary | ICD-10-CM | POA: Insufficient documentation

## 2011-01-13 DIAGNOSIS — Z8673 Personal history of transient ischemic attack (TIA), and cerebral infarction without residual deficits: Secondary | ICD-10-CM | POA: Insufficient documentation

## 2011-01-13 DIAGNOSIS — I10 Essential (primary) hypertension: Secondary | ICD-10-CM | POA: Insufficient documentation

## 2011-01-13 DIAGNOSIS — D1739 Benign lipomatous neoplasm of skin and subcutaneous tissue of other sites: Secondary | ICD-10-CM

## 2011-01-13 HISTORY — PX: MASS EXCISION: SHX2000

## 2011-01-13 SURGERY — EXCISION MASS
Anesthesia: General | Site: Abdomen | Laterality: Left | Wound class: Clean

## 2011-01-13 MED ORDER — PROPOFOL 10 MG/ML IV EMUL
INTRAVENOUS | Status: DC | PRN
  Administered 2011-01-13: 200 mg via INTRAVENOUS

## 2011-01-13 MED ORDER — ONDANSETRON HCL 4 MG/2ML IJ SOLN
INTRAMUSCULAR | Status: DC | PRN
  Administered 2011-01-13: 4 mg via INTRAVENOUS

## 2011-01-13 MED ORDER — LACTATED RINGERS IV SOLN
INTRAVENOUS | Status: DC
  Administered 2011-01-13: 10:00:00 via INTRAVENOUS

## 2011-01-13 MED ORDER — LIDOCAINE HCL 1 % IJ SOLN
INTRAMUSCULAR | Status: DC | PRN
  Administered 2011-01-13: 11:00:00 via SUBCUTANEOUS

## 2011-01-13 MED ORDER — FENTANYL CITRATE 0.05 MG/ML IJ SOLN
INTRAMUSCULAR | Status: DC | PRN
  Administered 2011-01-13: 100 ug via INTRAVENOUS

## 2011-01-13 MED ORDER — OXYCODONE-ACETAMINOPHEN 5-325 MG PO TABS: 1.0000 | ORAL_TABLET | ORAL | Status: DC | PRN

## 2011-01-13 MED ORDER — MIDAZOLAM HCL 2 MG/2ML IJ SOLN
1.0000 mg | INTRAMUSCULAR | Status: AC | PRN
  Administered 2011-01-13 (×2): 1 mg via INTRAVENOUS

## 2011-01-13 MED ORDER — LACTATED RINGERS IV SOLN
INTRAVENOUS | Status: DC | PRN
  Administered 2011-01-13: 10:00:00 via INTRAVENOUS

## 2011-01-13 MED ORDER — ROCURONIUM BROMIDE 100 MG/10ML IV SOLN
INTRAVENOUS | Status: DC | PRN
  Administered 2011-01-13: 40 mg via INTRAVENOUS

## 2011-01-13 MED ORDER — 0.9 % SODIUM CHLORIDE (POUR BTL) OPTIME
TOPICAL | Status: DC | PRN
  Administered 2011-01-13: 1000 mL

## 2011-01-13 MED ORDER — MIDAZOLAM HCL 2 MG/2ML IJ SOLN
INTRAMUSCULAR | Status: AC
  Administered 2011-01-13: 1 mg via INTRAVENOUS
  Filled 2011-01-13: qty 2

## 2011-01-13 MED ORDER — DROPERIDOL 2.5 MG/ML IJ SOLN
0.6250 mg | INTRAMUSCULAR | Status: DC | PRN
  Administered 2011-01-13: 0.625 mg via INTRAVENOUS

## 2011-01-13 MED ORDER — DROPERIDOL 2.5 MG/ML IJ SOLN
INTRAMUSCULAR | Status: AC
  Filled 2011-01-13: qty 2

## 2011-01-13 MED ORDER — GLYCOPYRROLATE 0.2 MG/ML IJ SOLN
INTRAMUSCULAR | Status: DC | PRN
  Administered 2011-01-13: .8 mg via INTRAVENOUS

## 2011-01-13 MED ORDER — HYDROMORPHONE HCL PF 1 MG/ML IJ SOLN: 0.2500 mg | INTRAMUSCULAR | Status: DC | PRN

## 2011-01-13 MED ORDER — MIDAZOLAM HCL 5 MG/5ML IJ SOLN
INTRAMUSCULAR | Status: DC | PRN
  Administered 2011-01-13: 1 mg via INTRAVENOUS

## 2011-01-13 SURGICAL SUPPLY — 50 items
ADH SKN CLS APL DERMABOND .7 (GAUZE/BANDAGES/DRESSINGS) ×1
APL SKNCLS STERI-STRIP NONHPOA (GAUZE/BANDAGES/DRESSINGS) ×1
BENZOIN TINCTURE PRP APPL 2/3 (GAUZE/BANDAGES/DRESSINGS) ×2 IMPLANT
BLADE SURG 10 STRL SS (BLADE) ×2 IMPLANT
BLADE SURG 15 STRL LF DISP TIS (BLADE) ×1 IMPLANT
BLADE SURG 15 STRL SS (BLADE) ×2
CANISTER SUCTION 2500CC (MISCELLANEOUS) ×2 IMPLANT
CHLORAPREP W/TINT 26ML (MISCELLANEOUS) ×2 IMPLANT
CLOTH BEACON ORANGE TIMEOUT ST (SAFETY) ×2 IMPLANT
COVER SURGICAL LIGHT HANDLE (MISCELLANEOUS) ×2 IMPLANT
DERMABOND ADVANCED (GAUZE/BANDAGES/DRESSINGS) ×1
DERMABOND ADVANCED .7 DNX12 (GAUZE/BANDAGES/DRESSINGS) IMPLANT
DRAPE LAPAROSCOPIC ABDOMINAL (DRAPES) IMPLANT
DRAPE PED LAPAROTOMY (DRAPES) IMPLANT
DRSG TEGADERM 4X4.75 (GAUZE/BANDAGES/DRESSINGS) IMPLANT
ELECT CAUTERY BLADE 6.4 (BLADE) ×2 IMPLANT
ELECT REM PT RETURN 9FT ADLT (ELECTROSURGICAL) ×2
ELECTRODE REM PT RTRN 9FT ADLT (ELECTROSURGICAL) ×1 IMPLANT
GAUZE SPONGE 4X4 16PLY XRAY LF (GAUZE/BANDAGES/DRESSINGS) ×2 IMPLANT
GLOVE BIO SURGEON STRL SZ 6 (GLOVE) ×2 IMPLANT
GLOVE BIO SURGEON STRL SZ7.5 (GLOVE) ×1 IMPLANT
GLOVE BIOGEL PI IND STRL 6.5 (GLOVE) ×1 IMPLANT
GLOVE BIOGEL PI IND STRL 7.5 (GLOVE) IMPLANT
GLOVE BIOGEL PI INDICATOR 6.5 (GLOVE)
GLOVE BIOGEL PI INDICATOR 7.5 (GLOVE) ×1
GOWN PREVENTION PLUS XXLARGE (GOWN DISPOSABLE) ×2 IMPLANT
GOWN STRL NON-REIN LRG LVL3 (GOWN DISPOSABLE) ×2 IMPLANT
KIT BASIN OR (CUSTOM PROCEDURE TRAY) ×2 IMPLANT
KIT ROOM TURNOVER OR (KITS) ×2 IMPLANT
LATEX BIOGEL INDICATOR UNDERGLOVE 6.5 ×1 IMPLANT
NDL HYPO 25GX1X1/2 BEV (NEEDLE) ×1 IMPLANT
NEEDLE HYPO 25GX1X1/2 BEV (NEEDLE) ×2 IMPLANT
NS IRRIG 1000ML POUR BTL (IV SOLUTION) ×2 IMPLANT
PACK SURGICAL SETUP 50X90 (CUSTOM PROCEDURE TRAY) ×2 IMPLANT
PAD ARMBOARD 7.5X6 YLW CONV (MISCELLANEOUS) ×4 IMPLANT
PENCIL BUTTON HOLSTER BLD 10FT (ELECTRODE) ×2 IMPLANT
SPECIMEN JAR SMALL (MISCELLANEOUS) ×2 IMPLANT
SPONGE GAUZE 4X4 12PLY (GAUZE/BANDAGES/DRESSINGS) IMPLANT
STRIP CLOSURE SKIN 1/2X4 (GAUZE/BANDAGES/DRESSINGS) IMPLANT
SUT MON AB 4-0 PC3 18 (SUTURE) ×2 IMPLANT
SUT SILK 2 0 FS (SUTURE) IMPLANT
SUT VIC AB 3-0 SH 27 (SUTURE) ×2
SUT VIC AB 3-0 SH 27X BRD (SUTURE) ×1 IMPLANT
SYR BULB 3OZ (MISCELLANEOUS) ×2 IMPLANT
SYR CONTROL 10ML LL (SYRINGE) ×2 IMPLANT
TOWEL OR 17X24 6PK STRL BLUE (TOWEL DISPOSABLE) ×2 IMPLANT
TOWEL OR 17X26 10 PK STRL BLUE (TOWEL DISPOSABLE) ×2 IMPLANT
TUBE CONNECTING 12X1/4 (SUCTIONS) IMPLANT
WATER STERILE IRR 1000ML POUR (IV SOLUTION) IMPLANT
YANKAUER SUCT BULB TIP NO VENT (SUCTIONS) IMPLANT

## 2011-01-13 NOTE — Transfer of Care (Signed)
Immediate Anesthesia Transfer of Care Note  Patient: Yvonne Tran  Procedure(s) Performed:  EXCISION MASS - Left abdominal wall mass (subcutaneous, 4-5 cm).  Patient Location: PACU  Anesthesia Type: General  Level of Consciousness: awake and alert   Airway & Oxygen Therapy: Patient Spontanous Breathing and Patient connected to nasal cannula oxygen  Post-op Assessment: Report given to PACU RN, Post -op Vital signs reviewed and stable and Patient moving all extremities X 4  Post vital signs: Reviewed and stable  Complications: No apparent anesthesia complications

## 2011-01-13 NOTE — Anesthesia Postprocedure Evaluation (Signed)
Anesthesia Post Note  Patient: Yvonne Tran  Procedure(s) Performed:  EXCISION MASS - Left abdominal wall mass (subcutaneous, 4-5 cm).  Anesthesia type: general  Patient location: PACU  Post pain: Pain level controlled  Post assessment: Patient's Cardiovascular Status Stable  Last Vitals:  Filed Vitals:   01/13/11 1340  BP: 182/67  Pulse: 61  Temp:   Resp: 12    Post vital signs: Reviewed and stable  Level of consciousness: sedated  Complications: No apparent anesthesia complications

## 2011-01-13 NOTE — Op Note (Signed)
01/13/2011  PATIENT:  Yvonne Tran  77 y.o. female  PRE-OPERATIVE DIAGNOSIS:  Left abdominal wall mass 5 x 3 cm  POST-OPERATIVE DIAGNOSIS:  Same  PROCEDURE:  Procedure(s): EXCISION of 5 x 3 cm subfascial mass  SURGEON:  Surgeon(s): Almond Lint, MD  ANESTHESIA:   local and general  EBL:   Minimal  LOCAL MEDICATIONS USED:  MARCAINE and XYLOCAINE   SPECIMEN:  Source of Specimen:  Left abdominal wall mass  DISPOSITION OF SPECIMEN:  PATHOLOGY  COUNTS:  YES  PLAN OF ZOXW:[id number]}  PATIENT DISPOSITION:  PACU - hemodynamically stable.  Procedure:  Pt was taken to operating room and placed on the operating room table on a bean bag.  General anesthesia was induced.  The patient was bumped slightly on the left for exposure.  The abdominal wall was prepped and draped in sterile fashion.   Time out was performed according the the surgical safety checklist.  When all was correct, we continued.    A obliquely oriented transverse incision was made over the mass in the left lateral abdomen.  The skin was anesthetized.  The skin was incised with a #10 blade approx 6 cm in length.  The subcutaneous tissue was divided with the cautery.  The mass was below the fascia.  The fascia was divided and the mass was removed with the cautery.  There was an inflow vessel that was divided with cautery as well.  The medial aspect of the tumor was in the intercostal muscle.    Hemostasis was achieved with the cautery.  The wound was irrigated.  This was closed in layers with 3-0 Vicryl interrupted deep sutures, and interrupted deep dermal sutures.  The skin was reapproximated with 4-0 Monocryl in subcuticular fashion.    The wound was cleaned, dried, and dressed with Dermabond.  Needle, sponge, and instrument counts were correct.    The patient was awakened from anesthesia and taken to the pacu in stable condition.

## 2011-01-13 NOTE — Anesthesia Procedure Notes (Signed)
Procedure Name: Intubation Date/Time: 01/13/2011 10:15 AM Performed by: Rosita Fire Pre-anesthesia Checklist: Patient identified, Emergency Drugs available, Suction available and Patient being monitored Patient Re-evaluated:Patient Re-evaluated prior to inductionOxygen Delivery Method: Circle System Utilized Preoxygenation: Pre-oxygenation with 100% oxygen Ventilation: Mask ventilation without difficulty and Oral airway inserted - appropriate to patient size Laryngoscope Size: Mac and 2 Grade View: Grade II Tube type: Oral Number of attempts: 1 Placement Confirmation: ETT inserted through vocal cords under direct vision,  breath sounds checked- equal and bilateral and positive ETCO2 Secured at: 23 cm Tube secured with: Tape Dental Injury: Teeth and Oropharynx as per pre-operative assessment

## 2011-01-13 NOTE — H&P (Signed)
HPI  Pt is a 77 year old female who has had a mass in the abdominal wall around 16 months.This has been enlarging and becoming more painful.   She continues to have pain in that area and cannot sleep on that side. She has not had any infection or drainage. The discomfort is around a 4-5/10.   Review of Systems  Eyes: Positive for visual disturbance.  Cardiovascular: Positive for chest pain.  Gastrointestinal: Positive for abdominal pain.  Musculoskeletal: Positive for arthralgias.  Neurological: Positive for weakness.  Past Medical History   Diagnosis  Date   .  Hyperlipidemia    .  Hypertension    .  Heart attack    .  Lipoma    .  Cardiac angina     Past Surgical History   Procedure  Date   .  Cardiac catheterization  2012   .  Eye surgery      left eye, lens replacement   Current outpatient prescriptions:amLODipine (NORVASC) 10 MG tablet, Take 10 mg by mouth daily. , Disp: , Rfl: ; hydrochlorothiazide (MICROZIDE) 12.5 MG capsule, Take 12.5 mg by mouth daily. , Disp: , Rfl: ; ketorolac (ACULAR) 0.5 % ophthalmic solution, 1 drop 4 (four) times daily. , Disp: , Rfl: ; nitroGLYCERIN 2.5 MG CR capsule, Take 2.5 mg by mouth as needed. , Disp: , Rfl:  Allergies   Allergen  Reactions   .  Eggs Or Egg-Derived Products    .  Lemon Juice    .  Milk-Related Compounds    .  Shellfish Allergy     Family History   Problem  Relation  Age of Onset   .  Cancer  Mother       brain    .  Cancer  Brother       prostate    History    Social History   .  Marital Status:  Single     Spouse Name:  N/A     Number of Children:  N/A   .  Years of Education:  N/A    Occupational History   .  Not on file.    Social History Main Topics   .  Smoking status:  Never Smoker   .  Smokeless tobacco:  Not on file   .  Alcohol Use:  No   .  Drug Use:  No   .  Sexually Active:     Other Topics  Concern   .  Not on file    Social History Narrative   .  No narrative on file    Objective:     Physical Exam  Constitutional:  She appears well-developed and well-nourished. No distress.  Head: Normocephalic and atraumatic.  Eyes: Conjunctivae are normal. PERRL Cardiovascular: Normal rate, regular rhythm Pulmonary/Chest: Effort normal and breath sounds normal. No respiratory distress.  Abdominal: Soft. She exhibits mass. She exhibits no distension. There is no tenderness. There is no rebound and no guarding.  4 x 5 cm mass  Musculoskeletal: Normal range of motion. She exhibits no edema and no tenderness.  Neurological: She is alert and oriented to person, place, and time.  Skin: Skin is warm and dry. No rash noted. She is not diaphoretic. No erythema. No pallor.  Psychiatric: She has a normal mood and affect. Her behavior is normal. Judgment and thought content normal.  Assessment/Plan:   Abdominal wall mass of left upper quadrant, 4 x 5 cm in anterior axillary line.  Mass is symptomatic.  We have cardiac clearance.  She has been off aspirin We will resect under general anesthesia She was advised that these mass excisions can cause a divot to be present in the skin.  Also, due to the size of the mass, we may need to leave a surgical drain.  I advised her that this is an outpatient procedure that would likely take around an hour.  We also discussed bleeding and infection.

## 2011-01-13 NOTE — Anesthesia Preprocedure Evaluation (Addendum)
Anesthesia Evaluation   Patient awake    Reviewed: Allergy & Precautions, H&P , NPO status , Patient's Chart, lab work & pertinent test results  Airway       Dental  (+) Upper Dentures and Lower Dentures   Pulmonary shortness of breath and with exertion, Recent URI , Resolved,  clear to auscultation  Pulmonary exam normal       Cardiovascular hypertension, Pt. on medications + angina with exertion + Past MI Regular Normal- Systolic murmurs    Neuro/Psych  Headaches, CVA, No Residual Symptoms    GI/Hepatic negative GI ROS, Neg liver ROS, PUD,   Endo/Other    Renal/GU negative Renal ROS     Musculoskeletal   Abdominal   Peds  Hematology   Anesthesia Other Findings   Reproductive/Obstetrics                          Anesthesia Physical Anesthesia Plan  ASA: III  Anesthesia Plan: General   Post-op Pain Management:    Induction: Intravenous  Airway Management Planned: LMA and Oral ETT  Additional Equipment:   Intra-op Plan:   Post-operative Plan:   Informed Consent: I have reviewed the patients History and Physical, chart, labs and discussed the procedure including the risks, benefits and alternatives for the proposed anesthesia with the patient or authorized representative who has indicated his/her understanding and acceptance.   Dental Advisory Given  Plan Discussed with: CRNA, Anesthesiologist and Surgeon  Anesthesia Plan Comments:        Anesthesia Quick Evaluation

## 2011-01-13 NOTE — Preoperative (Signed)
Beta Blockers   Reason not to administer Beta Blockers:Not Applicable 

## 2011-01-14 ENCOUNTER — Encounter (HOSPITAL_COMMUNITY): Payer: Self-pay | Admitting: General Surgery

## 2011-01-14 MED FILL — Hydromorphone HCl Inj 1 MG/ML: INTRAMUSCULAR | Qty: 1 | Status: AC

## 2011-01-15 NOTE — Progress Notes (Signed)
Quick Note:  Please let pt know that path is benign. Tx ______ 

## 2011-01-16 ENCOUNTER — Telehealth (INDEPENDENT_AMBULATORY_CARE_PROVIDER_SITE_OTHER): Payer: Self-pay

## 2011-02-11 NOTE — Telephone Encounter (Signed)
Close encounter 

## 2011-02-16 ENCOUNTER — Ambulatory Visit (INDEPENDENT_AMBULATORY_CARE_PROVIDER_SITE_OTHER): Payer: Medicare Other | Admitting: General Surgery

## 2011-02-16 ENCOUNTER — Encounter (INDEPENDENT_AMBULATORY_CARE_PROVIDER_SITE_OTHER): Payer: Self-pay | Admitting: General Surgery

## 2011-02-16 VITALS — BP 150/80 | HR 80 | Temp 96.6°F | Ht 65.0 in | Wt 213.8 lb

## 2011-02-16 DIAGNOSIS — R1902 Left upper quadrant abdominal swelling, mass and lump: Secondary | ICD-10-CM

## 2011-02-16 NOTE — Progress Notes (Signed)
HISTORY: Pt doing well s/p excision of left abdominal wall mass.  She is no longer taking narcotics.  She does take an occasional ibuprofen.  She is back to her regular activities.     PERTINENT REVIEW OF SYSTEMS: Negative   EXAM: Head: Normocephalic and atraumatic.  Resp: No respiratory distress, normal effort. Abd:  Abdomen is soft, non distended and non tender, incision is non tender.  No swelling is present.  There is a healing ridge at the scar.   Neurological: Alert and oriented to person, place, and time. Coordination normal.  Skin: Skin is warm and dry. No rash noted. No diaphoretic. No erythema. No pallor.  Psychiatric: Normal mood and affect. Normal behavior. Judgment and thought content normal.    Pathology reviewed and demonstrates: Benign lipoma 5.7 cm   ASSESSMENT AND PLAN:   Abdominal wall mass of left upper quadrant, 4 x 5 cm in anterior axillary line. Doing well post op. Follow up PRN Given copy of pathology.    Maudry Diego, MD Surgical Oncology, General & Endocrine Surgery [facility]  Gweneth Dimitri, MD, MD Gweneth Dimitri, MD

## 2011-02-16 NOTE — Patient Instructions (Signed)
Follow up as needed

## 2011-02-16 NOTE — Assessment & Plan Note (Signed)
Doing well post op. Follow up PRN Given copy of pathology.

## 2011-05-29 ENCOUNTER — Other Ambulatory Visit: Payer: Self-pay | Admitting: Family Medicine

## 2011-05-29 DIAGNOSIS — R0689 Other abnormalities of breathing: Secondary | ICD-10-CM

## 2011-05-29 DIAGNOSIS — Z780 Asymptomatic menopausal state: Secondary | ICD-10-CM

## 2011-05-29 DIAGNOSIS — Z1231 Encounter for screening mammogram for malignant neoplasm of breast: Secondary | ICD-10-CM

## 2011-05-29 DIAGNOSIS — R1310 Dysphagia, unspecified: Secondary | ICD-10-CM

## 2011-06-02 ENCOUNTER — Ambulatory Visit
Admission: RE | Admit: 2011-06-02 | Discharge: 2011-06-02 | Disposition: A | Discharge status: Home / Self Care | Payer: Medicare Other | Source: Ambulatory Visit | Attending: Family Medicine | Admitting: Family Medicine

## 2011-06-02 DIAGNOSIS — R0689 Other abnormalities of breathing: Secondary | ICD-10-CM

## 2011-06-02 DIAGNOSIS — R1310 Dysphagia, unspecified: Secondary | ICD-10-CM

## 2011-06-02 MED ORDER — IOHEXOL 300 MG/ML  SOLN
75.0000 mL | Freq: Once | INTRAMUSCULAR | Status: AC | PRN
  Administered 2011-06-02: 75 mL via INTRAVENOUS

## 2011-06-03 ENCOUNTER — Other Ambulatory Visit: Payer: Self-pay | Admitting: Family Medicine

## 2011-06-03 DIAGNOSIS — E041 Nontoxic single thyroid nodule: Secondary | ICD-10-CM

## 2011-06-09 ENCOUNTER — Ambulatory Visit
Admission: RE | Admit: 2011-06-09 | Discharge: 2011-06-09 | Disposition: A | Discharge status: Home / Self Care | Payer: Medicare Other | Source: Ambulatory Visit | Attending: Family Medicine | Admitting: Family Medicine

## 2011-06-09 DIAGNOSIS — E041 Nontoxic single thyroid nodule: Secondary | ICD-10-CM

## 2011-06-10 ENCOUNTER — Telehealth (INDEPENDENT_AMBULATORY_CARE_PROVIDER_SITE_OTHER): Payer: Self-pay

## 2011-06-10 NOTE — Telephone Encounter (Signed)
Dr Corliss Blacker called stating pt has large thyriod goiter that is causing compressive symptoms. She was not yet ready to make appt but once she makes referral she requests pt be seen asap. Advised we will be happy to see pt. Will review with Dr Gerrit Friends for soon appt once referral made.

## 2011-06-15 ENCOUNTER — Other Ambulatory Visit: Payer: Medicare Other

## 2011-06-18 ENCOUNTER — Telehealth (INDEPENDENT_AMBULATORY_CARE_PROVIDER_SITE_OTHER): Payer: Self-pay

## 2011-06-18 NOTE — Telephone Encounter (Signed)
Dr Corliss Blacker called stating she wants to move pt appt up from 7-3 if possible due to possible compressive symptoms. I advised her I will have Dr Gerrit Friends review tests re: sooner appt. Dr Gerrit Friends reviewed reports and recommends Dr Corliss Blacker can set up  fna biopsy of dominant nodule and keep 7-3 appt. Dr Gerrit Friends states pts wheezing and SOB are not due to thyroid nodules. More concerned may be caused by obesity and emphysema shown on CT. I have left msg on Dr Darrell Jewel voicemail with this msg.

## 2011-06-29 ENCOUNTER — Ambulatory Visit
Admission: RE | Admit: 2011-06-29 | Discharge: 2011-06-29 | Disposition: A | Discharge status: Home / Self Care | Payer: Medicare Other | Source: Ambulatory Visit | Attending: Family Medicine | Admitting: Family Medicine

## 2011-06-29 DIAGNOSIS — Z1231 Encounter for screening mammogram for malignant neoplasm of breast: Secondary | ICD-10-CM

## 2011-06-29 DIAGNOSIS — Z780 Asymptomatic menopausal state: Secondary | ICD-10-CM

## 2011-07-08 ENCOUNTER — Encounter (INDEPENDENT_AMBULATORY_CARE_PROVIDER_SITE_OTHER): Payer: Self-pay | Admitting: Surgery

## 2011-07-08 ENCOUNTER — Ambulatory Visit (INDEPENDENT_AMBULATORY_CARE_PROVIDER_SITE_OTHER): Payer: Medicare Other | Admitting: Surgery

## 2011-07-08 VITALS — BP 142/88 | HR 76 | Temp 97.8°F | Resp 18 | Ht 64.5 in | Wt 213.2 lb

## 2011-07-08 DIAGNOSIS — E042 Nontoxic multinodular goiter: Secondary | ICD-10-CM

## 2011-07-08 NOTE — Patient Instructions (Signed)
[facility] [phone]  THYROID & PARATHYROID SURGERY -- POST OP INSTRUCTIONS  Always review your discharge instruction sheet from the facility where your surgery was performed.  1. A prescription for pain medication may be given to you upon discharge.  Take your pain medication as prescribed, if needed.  If narcotic pain medicine is not needed, then you may take acetaminophen (Tylenol) or ibuprofen (Advil) as needed. 2. Take your usually prescribed medications unless otherwise directed. 3. If you need a refill on your pain medication, please contact your pharmacy. They will contact our office to request authorization.  Prescriptions will not be processed after 5 pm or on weekends. 4. Start with a light diet upon arrival home, such as soup and crackers, etc.  Be sure to drink pleny of fluids daily.  Resume your normal diet the day after surgery. 5. Most patients will experience some swelling and bruising on the chest and neck area.  Ice packs will help.  Swelling and bruising can take several days to resolve.  6. It is common to experience some constipation if taking pain medication after surgery.  Increasing fluid intake and taking a stool softener will usually help or prevent this problem.  A mild laxative (Milk of Magnesia or Miralax) should be taken according to package directions if there are no bowel movements after 48 hours. 7. You may remove your bandages 24-48 hours after surgery, and you may shower at that time.  You have steri-strips (small skin tapes) in place directly over the incision.  These strips should be left on the skin for 7-10 days and then removed. 8. You may resume regular (light) daily activities beginning the next day-such as daily self-care, walking, climbing stairs-gradually increasing activities as tolerated.  You may have sexual intercourse when it is comfortable.  Refrain from any heavy lifting or straining until approved by your doctor.  You may drive  when you no longer are taking prescription pain medication, you can comfortably wear a seatbelt, and you can safely maneuver your car and apply brakes. 9. You should see your doctor in the office for a follow-up appointment approximately two weeks after your surgery.  Make sure that you call for this appointment within a day or two after you arrive home to insure a convenient appointment time.  WHEN TO CALL YOUR DOCTOR: 1. Fever over 101.5 2. Inability to urinate 3. Nausea and/or vomiting - persistent 4. Extreme swelling or bruising 5. Continued bleeding from incision 6. Increased pain, redness, or drainage from the incision 7. Difficulty swallowing or breathing 8. Muscle cramping or spasms 9. Numbness or tingling in hands or feet or around lips  The clinic staff is available to answer your questions during regular business hours.  Please don't hesitate to call and ask to speak to one of the nurses if you have concerns.  [email or web address]   

## 2011-07-08 NOTE — Progress Notes (Signed)
General Surgery [facility]  Chief Complaint  Patient presents with  . New Evaluation    thyroid goiter with compression - referral from Dr. Gweneth Dimitri    HISTORY: Patient is a 77 year old black female referred by her primary care physician with multinodular thyroid goiter with compressive symptoms. Patient was first noted to have a multinodular thyroid goiter 1-1/2 years ago. Patient has noted some compressive symptoms including occasional dysphagia, shortness of breath requiring sleeping in an upright position, and mild discomfort. Patient underwent a CT scan of the chest which showed tracheal deviation to the right and 2 to an enlarged left thyroid lobe. There was mild compression of the trachea. Patient underwent a thyroid ultrasound in June 2013 which shows a large multinodular goiter with a dominant nodule measuring 4.1 cm in the left thyroid lobe.  There is no family history of thyroid disease. There is no family history of other endocrinopathy. Patient has never been on thyroid medication. Patient has had no prior head or neck surgery.  Past Medical History  Diagnosis Date  . Hyperlipidemia   . Hypertension   . Lipoma   . Cardiac angina   . Heart attack     30 YRS AGO  DR VARANASI  [facility]  . Stroke     MINI STROKE  YRS AGO   . Shortness of breath     WITH EXERTION   . Recurrent upper respiratory infection (URI)     RECENT COLD   . No pertinent past medical history     THYROID NODULE JUST WATCHING  . Headache      Current Outpatient Prescriptions  Medication Sig Dispense Refill  . amLODipine (NORVASC) 5 MG tablet Take 5 mg by mouth daily.        Marland Kitchen aspirin EC 81 MG tablet Take 81 mg by mouth daily.        . dorzolamide-timolol (COSOPT) 22.3-6.8 MG/ML ophthalmic solution Place 1 drop into the right eye 2 (two) times daily.        . hydrochlorothiazide (MICROZIDE) 12.5 MG capsule Take 12.5 mg by mouth daily as needed. For excess water.      .  Multiple Vitamins-Minerals (MULTIVITAMINS THER. W/MINERALS) TABS Take 1 tablet by mouth daily.        . nitroGLYCERIN (NITROSTAT) 0.4 MG SL tablet Place 0.4 mg under the tongue every 5 (five) minutes as needed. For chest pain.       Marland Kitchen oxyCODONE-acetaminophen (ROXICET) 5-325 MG per tablet Take 1 tablet by mouth every 4 (four) hours as needed for pain.  30 tablet  0  . Pitavastatin Calcium (LIVALO) 2 MG TABS Take 1 tablet by mouth daily.           Allergies  Allergen Reactions  . Eggs Or Egg-Derived Products Itching, Nausea Only and Swelling  . Lemon Juice Itching, Nausea Only and Swelling  . Milk-Related Compounds Itching, Nausea Only and Swelling  . Shellfish Allergy Itching, Nausea Only and Swelling     Family History  Problem Relation Age of Onset  . Cancer Mother     brain  . Cancer Brother     prostate  . Anesthesia problems Neg Hx   . Hypotension Neg Hx   . Malignant hyperthermia Neg Hx   . Pseudochol deficiency Neg Hx      History   Social History  . Marital Status: Single    Spouse Name: N/A    Number of Children: N/A  .  Years of Education: N/A   Social History Main Topics  . Smoking status: Never Smoker   . Smokeless tobacco: None  . Alcohol Use: No  . Drug Use: No  . Sexually Active:    Other Topics Concern  . None   Social History Narrative  . None     REVIEW OF SYSTEMS - PERTINENT POSITIVES ONLY: Mild dysphagia, mild shortness of breath, inability to sleep in a recumbent position, denies tremors. Denies palpitations.  EXAM: Filed Vitals:   07/08/11 1111  BP: 142/88  Pulse: 76  Temp: 97.8 F (36.6 C)  Resp: 18    HEENT: normocephalic; pupils equal and reactive; sclerae clear; dentition good; mucous membranes moist NECK:  asymmetric on extension; no palpable anterior or posterior cervical lymphadenopathy; no supraclavicular masses; no tenderness; Palpation shows an enlarged left thyroid lobe extending beneath the left clavicle. Margins of the  gland are smooth. There is mild tenderness. Trachea is deviated to the right. Right thyroid lobe is soft and nodular. There are no dominant masses. CHEST: clear to auscultation bilaterally without rales, rhonchi, or wheezes CARDIAC: regular rate and rhythm without significant murmur; peripheral pulses are full EXT:  non-tender without edema; no deformity NEURO: no gross focal deficits; no sign of tremor   LABORATORY RESULTS: See [facility] ([facility]Epic) for most recent results   RADIOLOGY RESULTS: See [facility] (CHL-Epic) for most recent results   IMPRESSION: Multinodular thyroid goiter with compression and tracheal deviation, symptomatic  PLAN: I discussed at length with the patient the indications for surgery. We discussed total thyroidectomy. We discussed risk and benefits including the possibility of recurrent laryngeal nerve injury and injury to parathyroid glands. We discussed the surgical incision to be anticipated. We discussed the hospital stay to be anticipated. We discussed her postoperative recovery. We discussed the need for lifelong thyroid hormone replacement. She understands and wishes to proceed. We will make arrangements for surgery in the near future.  The risks and benefits of the procedure have been discussed at length with the patient.  The patient understands the proposed procedure, potential alternative treatments, and the course of recovery to be expected.  All of the patient's questions have been answered at this time.  The patient wishes to proceed with surgery.  Velora Heckler, MD, FACS General & Endocrine Surgery [facility]   Visit Diagnoses: 1. Multinodular goiter (nontoxic)     Primary Care Physician: Gweneth Dimitri, MD

## 2011-08-03 ENCOUNTER — Encounter (HOSPITAL_COMMUNITY): Payer: Self-pay | Admitting: Pharmacy Technician

## 2011-08-06 ENCOUNTER — Encounter (HOSPITAL_COMMUNITY)
Admission: RE | Admit: 2011-08-06 | Discharge: 2011-08-06 | Disposition: A | Discharge status: Home / Self Care | Payer: Medicare Other | Source: Ambulatory Visit | Attending: Surgery | Admitting: Surgery

## 2011-08-06 ENCOUNTER — Encounter (HOSPITAL_COMMUNITY): Payer: Self-pay

## 2011-08-06 HISTORY — DX: Nontoxic goiter, unspecified: E04.9

## 2011-08-06 HISTORY — DX: Unspecified glaucoma: H40.9

## 2011-08-06 HISTORY — DX: Peripheral vascular disease, unspecified: I73.9

## 2011-08-06 HISTORY — DX: Pain, unspecified: R52

## 2011-08-06 LAB — CBC
HCT: 38.1 % (ref 36.0–46.0)
Hemoglobin: 12.6 g/dL (ref 12.0–15.0)
MCH: 28.3 pg (ref 26.0–34.0)
MCHC: 33.1 g/dL (ref 30.0–36.0)
MCV: 85.4 fL (ref 78.0–100.0)
Platelets: 398 10*3/uL (ref 150–400)
RBC: 4.46 MIL/uL (ref 3.87–5.11)
RDW: 14.9 % (ref 11.5–15.5)
WBC: 4 10*3/uL (ref 4.0–10.5)

## 2011-08-06 LAB — SURGICAL PCR SCREEN
MRSA, PCR: NEGATIVE
Staphylococcus aureus: NEGATIVE

## 2011-08-06 LAB — BASIC METABOLIC PANEL
BUN: 11 mg/dL (ref 6–23)
CO2: 30 mEq/L (ref 19–32)
Calcium: 9.2 mg/dL (ref 8.4–10.5)
Chloride: 103 mEq/L (ref 96–112)
Creatinine, Ser: 0.84 mg/dL (ref 0.50–1.10)
GFR calc Af Amer: 77 mL/min — ABNORMAL LOW (ref >90)
GFR calc non Af Amer: 67 mL/min — ABNORMAL LOW (ref >90)
Glucose, Bld: 96 mg/dL (ref 70–99)
Potassium: 3.4 mEq/L — ABNORMAL LOW (ref 3.5–5.1)
Sodium: 141 mEq/L (ref 135–145)

## 2011-08-06 NOTE — Anesthesia Preprocedure Evaluation (Addendum)
Anesthesia Evaluation  Patient identified by MRN, date of birth, ID band Patient awake    Reviewed: Allergy & Precautions, H&P , NPO status , Patient's Chart, lab work & pertinent test results  Airway Mallampati: II TM Distance: >3 FB Neck ROM: Full    Dental No notable dental hx.    Pulmonary neg pulmonary ROS, shortness of breath, Recent URI ,  CT scan of chest shows slight rightward deviation of trachea. breath sounds clear to auscultation  Pulmonary exam normal       Cardiovascular hypertension, Pt. on medications + angina with exertion + Past MI Rhythm:Regular Rate:Normal  Noncardiac chest pain. Last NTG was 2 weeks ago. Catheterization in 2009 looked good. Normal coronaries.  PVD: S/p left external iliac stenting. She still has leg pain   Neuro/Psych  Headaches, Weakness left arm and left leg after back surgery.  Glaucoma. Blind in Right eye. TIAnegative psych ROS   GI/Hepatic negative GI ROS, Neg liver ROS,   Endo/Other  negative endocrine ROS  Renal/GU negative Renal ROS  negative genitourinary   Musculoskeletal negative musculoskeletal ROS (+)   Abdominal   Peds negative pediatric ROS (+)  Hematology negative hematology ROS (+)   Anesthesia Other Findings   Reproductive/Obstetrics negative OB ROS                          Anesthesia Physical Anesthesia Plan  ASA: III  Anesthesia Plan: General   Post-op Pain Management:    Induction: Intravenous  Airway Management Planned: Oral ETT  Additional Equipment:   Intra-op Plan:   Post-operative Plan: Extubation in OR  Informed Consent: I have reviewed the patients History and Physical, chart, labs and discussed the procedure including the risks, benefits and alternatives for the proposed anesthesia with the patient or authorized representative who has indicated his/her understanding and acceptance.   Dental advisory  given  Plan Discussed with: CRNA  Anesthesia Plan Comments: (She had a lipoma removed 01/13/11 at [facility] and remembers a lot of severe sore throat in the PACU and as well she had difficulty breathing but of note she was discharged at 0200. I could not find documentation of this event. Her intubation was uneventful that day. Consider smaller ETT today.)       Anesthesia Quick Evaluation

## 2011-08-06 NOTE — Patient Instructions (Signed)
YOUR SURGERY IS SCHEDULED ON:  Friday  8/9  AT 9:15 AM  REPORT TO  SHORT STAY CENTER AT:  7:15 AM      PHONE # FOR SHORT STAY IS [phone]  DO NOT EAT OR DRINK ANYTHING AFTER MIDNIGHT THE NIGHT BEFORE YOUR SURGERY.  YOU MAY BRUSH YOUR TEETH, RINSE OUT YOUR MOUTH--BUT NO WATER, NO FOOD, NO CHEWING GUM, NO MINTS, NO CANDIES, NO CHEWING TOBACCO.  PLEASE TAKE THE FOLLOWING MEDICATIONS THE AM OF YOUR SURGERY WITH A FEW SIPS OF WATER:  AMLODIPINE, LIVALO,   USE YOUR EYE DROPS AND BRING YOUR EYE DROPS TO THE HOSPITAL.  BRING YOUR NITROGLYCERIN.    IF YOU USE INHALERS--USE YOUR INHALERS THE AM OF YOUR SURGERY AND BRING INHALERS TO THE HOSPITAL -TAKE TO SURGERY.    IF YOU ARE DIABETIC:  DO NOT TAKE ANY DIABETIC MEDICATIONS THE AM OF YOUR SURGERY.  IF YOU TAKE INSULIN IN THE EVENINGS--PLEASE ONLY TAKE 1/2 NORMAL EVENING DOSE THE NIGHT BEFORE YOUR SURGERY.  NO INSULIN THE AM OF YOUR SURGERY.  IF YOU HAVE SLEEP APNEA AND USE CPAP OR BIPAP--PLEASE BRING THE MASK --NOT THE MACHINE-NOT THE TUBING   -JUST THE MASK. DO NOT BRING VALUABLES, MONEY, CREDIT CARDS.  CONTACT LENS, DENTURES / PARTIALS, GLASSES SHOULD NOT BE WORN TO SURGERY AND IN MOST CASES-HEARING AIDS WILL NEED TO BE REMOVED.  BRING YOUR GLASSES CASE, ANY EQUIPMENT NEEDED FOR YOUR CONTACT LENS. FOR PATIENTS ADMITTED TO THE HOSPITAL--CHECK OUT TIME THE DAY OF DISCHARGE IS 11:00 AM.  ALL INPATIENT ROOMS ARE PRIVATE - WITH BATHROOM, TELEPHONE, TELEVISION AND WIFI INTERNET. IF YOU ARE BEING DISCHARGED THE SAME DAY OF YOUR SURGERY--YOU CAN NOT DRIVE YOURSELF HOME--AND SHOULD NOT GO HOME ALONE BY TAXI OR BUS.  NO DRIVING OR OPERATING MACHINERY FOR 24 HOURS FOLLOWING ANESTHESIA / PAIN MEDICATIONS.                            SPECIAL INSTRUCTIONS:  CHLORHEXIDINE SOAP SHOWER (other brand names are Betasept and Hibiclens ) PLEASE SHOWER WITH CHLORHEXIDINE THE NIGHT BEFORE YOUR SURGERY AND THE AM OF YOUR SURGERY. DO NOT USE CHLORHEXIDINE ON YOUR  FACE OR PRIVATE AREAS--YOU MAY USE YOUR NORMAL SOAP THOSE AREAS AND YOUR NORMAL SHAMPOO.  WOMEN SHOULD AVOID SHAVING UNDER ARMS AND SHAVING LEGS 48 HOURS BEFORE USING CHLORHEXIDINE TO AVOID SKIN IRRITATION.  DO NOT USE IF ALLERGIC TO CHLORHEXIDINE.  PLEASE READ OVER ANY  FACT SHEETS THAT YOU WERE GIVEN: MRSA INFORMATION

## 2011-08-06 NOTE — Pre-Procedure Instructions (Signed)
CBC, BMET WERE DONE TODAY - PREOP - AT [facility].  PT HAS AN EKG REPORT 05/13/11 FROM DR. Toniann Fail MCNEIL, CT CHEST REPORT 06/02/11 FROM  [facility], ECHOCARDIOGRAM REPORT 05/29/11 FROM DR. VARANASI / [facility], CARDIAC CATH REPORT 07/22/2007 [facility], OPERATIVE REPORT OF PTA / STENT LEFT EXTERNAL ILIAC ARTERY DONE ON 03/20/10 AT [facility]. DR. Council Mechanic REVIEWED ABOVE REPORTS DURING PREOP ANESTHESIOLOGY CONSULT TODAY AT [facility]. PREOP TEACHING WAS DONE USING TEACH BACK METHOD.

## 2011-08-11 NOTE — Progress Notes (Signed)
Quick Note:  These results are acceptable for scheduled surgery.  Keanon Bevins M. Danilyn Cocke, MD, FACS [facility] Office: [phone]   ______ 

## 2011-08-13 ENCOUNTER — Telehealth (INDEPENDENT_AMBULATORY_CARE_PROVIDER_SITE_OTHER): Payer: Self-pay

## 2011-08-13 ENCOUNTER — Other Ambulatory Visit (INDEPENDENT_AMBULATORY_CARE_PROVIDER_SITE_OTHER): Payer: Self-pay

## 2011-08-13 NOTE — Telephone Encounter (Signed)
Pt given PO appt and lab slip mailed to pt. Pt aware.

## 2011-08-14 ENCOUNTER — Encounter (HOSPITAL_COMMUNITY): Payer: Self-pay | Admitting: Anesthesiology

## 2011-08-14 ENCOUNTER — Ambulatory Visit (HOSPITAL_COMMUNITY): Payer: Medicare Other | Admitting: Anesthesiology

## 2011-08-14 ENCOUNTER — Encounter (HOSPITAL_COMMUNITY): Payer: Self-pay | Admitting: *Deleted

## 2011-08-14 ENCOUNTER — Encounter (HOSPITAL_COMMUNITY)
Admission: RE | Disposition: A | Discharge status: Home / Self Care | Payer: Self-pay | Source: Ambulatory Visit | Attending: Surgery

## 2011-08-14 ENCOUNTER — Encounter (HOSPITAL_COMMUNITY): Payer: Self-pay | Admitting: Surgery

## 2011-08-14 ENCOUNTER — Ambulatory Visit (HOSPITAL_COMMUNITY)
Admission: RE | Admit: 2011-08-14 | Discharge: 2011-08-15 | Disposition: A | Discharge status: Home / Self Care | Payer: Medicare Other | Source: Ambulatory Visit | Attending: Surgery | Admitting: Surgery

## 2011-08-14 DIAGNOSIS — I252 Old myocardial infarction: Secondary | ICD-10-CM | POA: Insufficient documentation

## 2011-08-14 DIAGNOSIS — I739 Peripheral vascular disease, unspecified: Secondary | ICD-10-CM | POA: Insufficient documentation

## 2011-08-14 DIAGNOSIS — Z01812 Encounter for preprocedural laboratory examination: Secondary | ICD-10-CM | POA: Insufficient documentation

## 2011-08-14 DIAGNOSIS — Z79899 Other long term (current) drug therapy: Secondary | ICD-10-CM | POA: Insufficient documentation

## 2011-08-14 DIAGNOSIS — E042 Nontoxic multinodular goiter: Secondary | ICD-10-CM | POA: Diagnosis present

## 2011-08-14 DIAGNOSIS — E785 Hyperlipidemia, unspecified: Secondary | ICD-10-CM | POA: Insufficient documentation

## 2011-08-14 DIAGNOSIS — I10 Essential (primary) hypertension: Secondary | ICD-10-CM | POA: Insufficient documentation

## 2011-08-14 DIAGNOSIS — Z8673 Personal history of transient ischemic attack (TIA), and cerebral infarction without residual deficits: Secondary | ICD-10-CM | POA: Insufficient documentation

## 2011-08-14 HISTORY — PX: THYROIDECTOMY: SHX17

## 2011-08-14 SURGERY — THYROIDECTOMY
Anesthesia: General | Site: Neck | Wound class: Clean

## 2011-08-14 MED ORDER — GLYCOPYRROLATE 0.2 MG/ML IJ SOLN
INTRAMUSCULAR | Status: DC | PRN
  Administered 2011-08-14: .6 mg via INTRAVENOUS

## 2011-08-14 MED ORDER — LIDOCAINE HCL (CARDIAC) 20 MG/ML IV SOLN
INTRAVENOUS | Status: DC | PRN
  Administered 2011-08-14: 100 mg via INTRAVENOUS

## 2011-08-14 MED ORDER — FENTANYL CITRATE 0.05 MG/ML IJ SOLN
INTRAMUSCULAR | Status: DC | PRN
  Administered 2011-08-14: 50 ug via INTRAVENOUS
  Administered 2011-08-14: 100 ug via INTRAVENOUS
  Administered 2011-08-14 (×2): 50 ug via INTRAVENOUS

## 2011-08-14 MED ORDER — FENTANYL CITRATE 0.05 MG/ML IJ SOLN
25.0000 ug | INTRAMUSCULAR | Status: DC | PRN
  Administered 2011-08-14: 50 ug via INTRAVENOUS

## 2011-08-14 MED ORDER — LACTATED RINGERS IV SOLN: INTRAVENOUS | Status: DC

## 2011-08-14 MED ORDER — KCL IN DEXTROSE-NACL 20-5-0.45 MEQ/L-%-% IV SOLN
INTRAVENOUS | Status: DC
  Administered 2011-08-14 – 2011-08-15 (×2): via INTRAVENOUS
  Filled 2011-08-14 (×3): qty 1000

## 2011-08-14 MED ORDER — HYDROMORPHONE HCL PF 1 MG/ML IJ SOLN
1.0000 mg | INTRAMUSCULAR | Status: DC | PRN
  Administered 2011-08-14 – 2011-08-15 (×4): 1 mg via INTRAVENOUS
  Filled 2011-08-14 (×4): qty 1

## 2011-08-14 MED ORDER — PROPOFOL 10 MG/ML IV EMUL
INTRAVENOUS | Status: DC | PRN
  Administered 2011-08-14: 150 mg via INTRAVENOUS

## 2011-08-14 MED ORDER — CEFAZOLIN SODIUM-DEXTROSE 2-3 GM-% IV SOLR
INTRAVENOUS | Status: AC
  Filled 2011-08-14: qty 50

## 2011-08-14 MED ORDER — NEOSTIGMINE METHYLSULFATE 1 MG/ML IJ SOLN
INTRAMUSCULAR | Status: DC | PRN
  Administered 2011-08-14: 4 mg via INTRAVENOUS

## 2011-08-14 MED ORDER — HYDROCODONE-ACETAMINOPHEN 5-325 MG PO TABS
1.0000 | ORAL_TABLET | ORAL | Status: DC | PRN
  Administered 2011-08-15: 1 via ORAL
  Filled 2011-08-14: qty 1

## 2011-08-14 MED ORDER — FENTANYL CITRATE 0.05 MG/ML IJ SOLN
INTRAMUSCULAR | Status: AC
  Filled 2011-08-14: qty 2

## 2011-08-14 MED ORDER — ONDANSETRON HCL 4 MG/2ML IJ SOLN: 4.0000 mg | Freq: Four times a day (QID) | INTRAMUSCULAR | Status: DC | PRN

## 2011-08-14 MED ORDER — PROMETHAZINE HCL 25 MG/ML IJ SOLN
INTRAMUSCULAR | Status: AC
  Filled 2011-08-14: qty 1

## 2011-08-14 MED ORDER — CALCIUM CARBONATE ANTACID 500 MG PO CHEW
2.0000 | CHEWABLE_TABLET | Freq: Three times a day (TID) | ORAL | Status: DC
  Administered 2011-08-14 – 2011-08-15 (×3): 400 mg via ORAL
  Filled 2011-08-14 (×5): qty 2

## 2011-08-14 MED ORDER — CEFAZOLIN SODIUM-DEXTROSE 2-3 GM-% IV SOLR: 2.0000 g | INTRAVENOUS | Status: DC

## 2011-08-14 MED ORDER — PROMETHAZINE HCL 25 MG/ML IJ SOLN
6.2500 mg | INTRAMUSCULAR | Status: DC | PRN
  Administered 2011-08-14: 12.5 mg via INTRAVENOUS

## 2011-08-14 MED ORDER — LACTATED RINGERS IV SOLN
INTRAVENOUS | Status: DC | PRN
  Administered 2011-08-14 (×2): via INTRAVENOUS

## 2011-08-14 MED ORDER — MIDAZOLAM HCL 5 MG/5ML IJ SOLN
INTRAMUSCULAR | Status: DC | PRN
  Administered 2011-08-14: 2 mg via INTRAVENOUS

## 2011-08-14 MED ORDER — ROCURONIUM BROMIDE 100 MG/10ML IV SOLN
INTRAVENOUS | Status: DC | PRN
  Administered 2011-08-14: 40 mg via INTRAVENOUS
  Administered 2011-08-14: 10 mg via INTRAVENOUS

## 2011-08-14 MED ORDER — ONDANSETRON HCL 4 MG PO TABS: 4.0000 mg | ORAL_TABLET | Freq: Four times a day (QID) | ORAL | Status: DC | PRN

## 2011-08-14 MED ORDER — MEPERIDINE HCL 50 MG/ML IJ SOLN: 6.2500 mg | INTRAMUSCULAR | Status: DC | PRN

## 2011-08-14 MED ORDER — ACETAMINOPHEN 325 MG PO TABS: 650.0000 mg | ORAL_TABLET | ORAL | Status: DC | PRN

## 2011-08-14 MED ORDER — ONDANSETRON HCL 4 MG/2ML IJ SOLN
INTRAMUSCULAR | Status: DC | PRN
  Administered 2011-08-14: 4 mg via INTRAVENOUS

## 2011-08-14 MED ORDER — DORZOLAMIDE HCL-TIMOLOL MAL 2-0.5 % OP SOLN
1.0000 [drp] | Freq: Two times a day (BID) | OPHTHALMIC | Status: DC
  Administered 2011-08-14 – 2011-08-15 (×2): 1 [drp] via OPHTHALMIC
  Filled 2011-08-14: qty 10

## 2011-08-14 MED ORDER — 0.9 % SODIUM CHLORIDE (POUR BTL) OPTIME
TOPICAL | Status: DC | PRN
  Administered 2011-08-14: 1000 mL

## 2011-08-14 MED ORDER — AMLODIPINE BESYLATE 10 MG PO TABS
10.0000 mg | ORAL_TABLET | Freq: Every morning | ORAL | Status: DC
  Administered 2011-08-15: 10 mg via ORAL
  Filled 2011-08-14: qty 1

## 2011-08-14 SURGICAL SUPPLY — 40 items
APL SKNCLS STERI-STRIP NONHPOA (GAUZE/BANDAGES/DRESSINGS) ×1
ATTRACTOMAT 16X20 MAGNETIC DRP (DRAPES) ×2 IMPLANT
BENZOIN TINCTURE PRP APPL 2/3 (GAUZE/BANDAGES/DRESSINGS) ×2 IMPLANT
BLADE HEX COATED 2.75 (ELECTRODE) ×2 IMPLANT
BLADE SURG 15 STRL LF DISP TIS (BLADE) ×1 IMPLANT
BLADE SURG 15 STRL SS (BLADE) ×2
CANISTER SUCTION 2500CC (MISCELLANEOUS) ×2 IMPLANT
CHLORAPREP W/TINT 10.5 ML (MISCELLANEOUS) ×2 IMPLANT
CLIP TI MEDIUM 6 (CLIP) ×5 IMPLANT
CLIP TI WIDE RED SMALL 6 (CLIP) ×5 IMPLANT
CLOTH BEACON ORANGE TIMEOUT ST (SAFETY) ×2 IMPLANT
CLSR STERI-STRIP ANTIMIC 1/2X4 (GAUZE/BANDAGES/DRESSINGS) ×1 IMPLANT
DISSECTOR ROUND CHERRY 3/8 STR (MISCELLANEOUS) IMPLANT
DRAPE PED LAPAROTOMY (DRAPES) ×2 IMPLANT
DRESSING SURGICEL FIBRLLR 1X2 (HEMOSTASIS) ×1 IMPLANT
DRSG SURGICEL FIBRILLAR 1X2 (HEMOSTASIS) ×2
ELECT REM PT RETURN 9FT ADLT (ELECTROSURGICAL) ×2
ELECTRODE REM PT RTRN 9FT ADLT (ELECTROSURGICAL) ×1 IMPLANT
GAUZE SPONGE 4X4 16PLY XRAY LF (GAUZE/BANDAGES/DRESSINGS) ×2 IMPLANT
GLOVE SURG ORTHO 8.0 STRL STRW (GLOVE) ×2 IMPLANT
GOWN STRL NON-REIN LRG LVL3 (GOWN DISPOSABLE) ×2 IMPLANT
GOWN STRL REIN XL XLG (GOWN DISPOSABLE) ×4 IMPLANT
KIT BASIN OR (CUSTOM PROCEDURE TRAY) ×2 IMPLANT
NS IRRIG 1000ML POUR BTL (IV SOLUTION) ×2 IMPLANT
PACK BASIC VI WITH GOWN DISP (CUSTOM PROCEDURE TRAY) ×2 IMPLANT
PENCIL BUTTON HOLSTER BLD 10FT (ELECTRODE) ×2 IMPLANT
SHEARS HARMONIC 9CM CVD (BLADE) ×2 IMPLANT
SPONGE GAUZE 4X4 12PLY (GAUZE/BANDAGES/DRESSINGS) ×1 IMPLANT
STAPLER VISISTAT 35W (STAPLE) ×2 IMPLANT
STRIP CLOSURE SKIN 1/2X4 (GAUZE/BANDAGES/DRESSINGS) ×2 IMPLANT
SUT MNCRL AB 4-0 PS2 18 (SUTURE) ×2 IMPLANT
SUT SILK 2 0 (SUTURE) ×2
SUT SILK 2-0 18XBRD TIE 12 (SUTURE) ×1 IMPLANT
SUT SILK 3 0 (SUTURE)
SUT SILK 3-0 18XBRD TIE 12 (SUTURE) IMPLANT
SUT VIC AB 3-0 SH 18 (SUTURE) ×2 IMPLANT
SYR BULB IRRIGATION 50ML (SYRINGE) ×2 IMPLANT
TAPE CLOTH SURG 4X10 WHT LF (GAUZE/BANDAGES/DRESSINGS) ×1 IMPLANT
TOWEL OR 17X26 10 PK STRL BLUE (TOWEL DISPOSABLE) ×2 IMPLANT
YANKAUER SUCT BULB TIP 10FT TU (MISCELLANEOUS) ×2 IMPLANT

## 2011-08-14 NOTE — Brief Op Note (Signed)
08/14/2011  10:38 AM  PATIENT:  Yvonne Tran  77 y.o. female  PRE-OPERATIVE DIAGNOSIS:  thyroid goiter with compression  POST-OPERATIVE DIAGNOSIS:  thyroid goiter with compression  PROCEDURE:  Total thyroidectomy  SURGEON:  Surgeon(s) and Role:    * Velora Heckler, MD - Primary  ANESTHESIA:   general  EBL:     BLOOD ADMINISTERED:none  DRAINS: none   LOCAL MEDICATIONS USED:  NONE  SPECIMEN:  Excision  DISPOSITION OF SPECIMEN:  PATHOLOGY  COUNTS:  YES  TOURNIQUET:  * No tourniquets in log *  DICTATION: .Other Dictation: Dictation Number [id number]  PLAN OF CARE: Admit for overnight observation  PATIENT DISPOSITION:  PACU - hemodynamically stable.   Delay start of Pharmacological VTE agent (>24hrs) due to surgical blood loss or risk of bleeding: yes  Velora Heckler, MD, FACS General & Endocrine Surgery [facility] Office: [phone]

## 2011-08-14 NOTE — Anesthesia Postprocedure Evaluation (Signed)
  Anesthesia Post-op Note  Patient: Yvonne Tran  Procedure(s) Performed: Procedure(s) (LRB): THYROIDECTOMY (N/A)  Patient Location: PACU  Anesthesia Type: General  Level of Consciousness: awake and alert   Airway and Oxygen Therapy: Patient Spontanous Breathing  Post-op Pain: mild  Post-op Assessment: Post-op Vital signs reviewed, Patient's Cardiovascular Status Stable, Respiratory Function Stable, Patent Airway and No signs of Nausea or vomiting  Post-op Vital Signs: stable  Complications: No apparent anesthesia complications

## 2011-08-14 NOTE — H&P (Signed)
Yvonne Tran is an 77 y.o. female.    Chief Complaint: multinodular thyroid goiter with compressive symptoms  HPI: Patient is a 77 year old black female referred by her primary care physician with multinodular thyroid goiter with compressive symptoms. Patient was first noted to have a multinodular thyroid goiter 1-1/2 years ago. Patient has noted some compressive symptoms including occasional dysphagia, shortness of breath requiring sleeping in an upright position, and mild discomfort. Patient underwent a CT scan of the chest which showed tracheal deviation to the right and 2 to an enlarged left thyroid lobe. There was mild compression of the trachea. Patient underwent a thyroid ultrasound in June 2013 which shows a large multinodular goiter with a dominant nodule measuring 4.1 cm in the left thyroid lobe.   There is no family history of thyroid disease. There is no family history of other endocrinopathy. Patient has never been on thyroid medication. Patient has had no prior head or neck surgery.    Past Medical History  Diagnosis Date  . Hyperlipidemia   . Hypertension   . Lipoma   . Cardiac angina   . Heart attack     30 YRS AGO  DR VARANASI  [place] CARD  . Stroke     MINI STROKE  YRS AGO   . Shortness of breath     WITH EXERTION   . Recurrent upper respiratory infection (URI) MAY 2013    RECENT COLD   . Goiter     THROID GOITER WITH COMPRESSION--PT HAVING TROUBLE SWALLOWING AND SOB  . Headache     LEFT TEMPORAL AREA  . Pain     RIGHT THIGH FOR QUITE SOME TIME--LEG GIVES AWAY SOMETIMES.  LOWER BACK PAIN  - S/P HX OF PREVIOUS BACK SURGERY  . Glaucoma     GLAUCOMA IN   BOTH EYES:  LEGALLY BLIND IN RIGHT EYE  . Peripheral vascular disease     09/2008 STENT LEFT EXTERNAL ILIAC;  03/20/10 RE-STENT OF LEFT EXTERNAL ILIAC STENT- AT [facility] WITH DR. VARANASI    Past Surgical History  Procedure Date  . Cardiac catheterization 2012  . Eye surgery     left eye, lens replacement    . Vascular surgery     STENT PLACED 03/2010 RT LEG  . Mass excision 01/13/2011    Procedure: EXCISION MASS;  Surgeon: Almond Lint, MD;  Location: [facility] OR;  Service: General;  Laterality: Left;  Left abdominal wall mass (subcutaneous, 4-5 cm).  . Back surgery 1988    PT STATES AFTER THE BACK SURGERY PT HAS PARALYSIS LEFT ARM AND LEG--TOOK 2&1/2 YEARS TO REGAIN USE OF LEFT SIDE--LEFT SIDE REMAINS WEAKER THAN HER RIFHT SIDE.  Marland Kitchen Appendectomy     Family History  Problem Relation Age of Onset  . Cancer Mother     brain  . Cancer Brother     prostate  . Anesthesia problems Neg Hx   . Hypotension Neg Hx   . Malignant hyperthermia Neg Hx   . Pseudochol deficiency Neg Hx    Social History:  reports that she has never smoked. She has never used smokeless tobacco. She reports that she does not drink alcohol or use illicit drugs.  Allergies:  Allergies  Allergen Reactions  . Crestor (Rosuvastatin)     MYALGIAS  . Eggs Or Egg-Derived Products Itching, Nausea Only and Swelling  . Lemon Juice Itching, Nausea Only and Swelling  . Milk-Related Compounds Itching, Nausea Only and Swelling  . Shellfish Allergy Itching, Nausea Only and  Swelling    Medications Prior to Admission  Medication Sig Dispense Refill  . amLODipine (NORVASC) 5 MG tablet Take 10 mg by mouth every morning.       Marland Kitchen aspirin EC 81 MG tablet Take 81 mg by mouth every morning.       . dorzolamide-timolol (COSOPT) 22.3-6.8 MG/ML ophthalmic solution Place 1 drop into both eyes 2 (two) times daily.       . hydrochlorothiazide (MICROZIDE) 12.5 MG capsule Take 12.5 mg by mouth daily as needed. For excess water.      . Multiple Vitamins-Minerals (MULTIVITAMINS THER. W/MINERALS) TABS Take 1 tablet by mouth daily.       . nitroGLYCERIN (NITROSTAT) 0.4 MG SL tablet Place 0.4 mg under the tongue every 5 (five) minutes as needed. For chest pain.      . Pitavastatin Calcium (LIVALO) 2 MG TABS Take by mouth every morning. 1/2 TABLET EVERY AM       . oxyCODONE-acetaminophen (PERCOCET/ROXICET) 5-325 MG per tablet Take 1 tablet by mouth every 4 (four) hours as needed. PT STATES RX NOT FILLED        No results found for this or any previous visit (from the past 48 hour(s)). No results found.  Review of Systems  Constitutional: Negative.   HENT:       Moderate dysphagia, globus sensation  Eyes: Negative.   Respiratory: Positive for shortness of breath.   Cardiovascular: Negative.   Gastrointestinal: Negative.   Genitourinary: Negative.   Musculoskeletal: Negative.   Skin: Negative.   Neurological: Negative.   Endo/Heme/Allergies: Negative.   Psychiatric/Behavioral: Negative.     Blood pressure 148/73, pulse 67, temperature 97 F (36.1 C), temperature source Oral, resp. rate 18, SpO2 99.00%. Physical Exam  Constitutional: She is oriented to person, place, and time. She appears well-developed and well-nourished.  HENT:  Head: Normocephalic and atraumatic.  Right Ear: External ear normal.  Left Ear: External ear normal.  Nose: Nose normal.  Mouth/Throat: Oropharynx is clear and moist.  Eyes: Conjunctivae and EOM are normal. Pupils are equal, round, and reactive to light. No scleral icterus.  Neck: Normal range of motion. Neck supple. Tracheal deviation (deviation to right) present. Thyromegaly (left lobe greater than right, smooth, firm nodule) present.  Cardiovascular: Normal rate, regular rhythm and normal heart sounds.   No murmur heard. Respiratory: Effort normal and breath sounds normal. No respiratory distress. She has no wheezes. She has no rales.  GI: Soft. Bowel sounds are normal. She exhibits no distension. There is no tenderness.  Musculoskeletal: Normal range of motion. She exhibits no edema and no tenderness.  Lymphadenopathy:    She has no cervical adenopathy.  Neurological: She is alert and oriented to person, place, and time. She has normal reflexes.  Skin: Skin is warm and dry.  Psychiatric: She has a  normal mood and affect. Her behavior is normal. Judgment and thought content normal.     Assessment/Plan Multinodular thyroid goiter with compressive symptoms  - plan total thyroidectomy  The risks and benefits of the procedure have been discussed at length with the patient.  The patient understands the proposed procedure, potential alternative treatments, and the course of recovery to be expected.  All of the patient's questions have been answered at this time.  The patient wishes to proceed with surgery.  Velora Heckler, MD, [facility] Office: [phone]    Ahmed Inniss Judie Petit 08/14/2011, 9:00 AM

## 2011-08-14 NOTE — Transfer of Care (Signed)
Immediate Anesthesia Transfer of Care Note  Patient: Yvonne Tran  Procedure(s) Performed: Procedure(s) (LRB): THYROIDECTOMY (N/A)  Patient Location: PACU  Anesthesia Type: General  Level of Consciousness: awake, sedated and responds to stimulation  Airway & Oxygen Therapy: Patient Spontanous Breathing and Patient connected to face mask oxygen  Post-op Assessment: Report given to PACU RN and Post -op Vital signs reviewed and stable  Post vital signs: Reviewed and stable  Complications: No apparent anesthesia complications

## 2011-08-15 LAB — BASIC METABOLIC PANEL
BUN: 11 mg/dL (ref 6–23)
CO2: 28 mEq/L (ref 19–32)
Calcium: 8.6 mg/dL (ref 8.4–10.5)
Chloride: 102 mEq/L (ref 96–112)
Creatinine, Ser: 0.79 mg/dL (ref 0.50–1.10)
GFR calc Af Amer: >90 mL/min (ref >90)
GFR calc non Af Amer: 80 mL/min — ABNORMAL LOW (ref >90)
Glucose, Bld: 115 mg/dL — ABNORMAL HIGH (ref 70–99)
Potassium: 3.6 mEq/L (ref 3.5–5.1)
Sodium: 138 mEq/L (ref 135–145)

## 2011-08-15 MED ORDER — HYDROCODONE-ACETAMINOPHEN 5-325 MG PO TABS: 1.0000 | ORAL_TABLET | ORAL | Status: AC | PRN

## 2011-08-15 MED ORDER — CALCIUM CARBONATE ANTACID 500 MG PO CHEW: 2.0000 | CHEWABLE_TABLET | Freq: Three times a day (TID) | ORAL | Status: DC

## 2011-08-15 MED ORDER — SYNTHROID 100 MCG PO TABS: 100.0000 ug | ORAL_TABLET | Freq: Every day | ORAL | Status: DC

## 2011-08-15 NOTE — Discharge Summary (Signed)
Physician Discharge Summary [facility]  Patient ID: NYAZIA CANEVARI MRN: [id number] DOB/AGE: 75-Dec-1936 75 y.o.  Admit date: 08/14/2011 Discharge date: 08/15/2011  Admission Diagnoses:  Multinodular thyroid goiter with compressive symptoms  Discharge Diagnoses:  Principal Problem:  *Multinodular goiter (nontoxic)   Discharged Condition: good  Hospital Course: patient admitted for observation after total thyroidectomy.  Stable overnight without significant swelling or bleeding.  Calcium level normal this AM at 8.6.  Tolerating po intake.  Prepared for discharge this AM.  Consults: None  Significant Diagnostic Studies: labs  Treatments: surgery: total thyroidectomy  Discharge Exam: Blood pressure 139/74, pulse 64, temperature 97.8 F (36.6 C), temperature source Oral, resp. rate 18, height 5\' 5"  (1.651 m), weight 202 lb 12.8 oz (91.989 kg), SpO2 98.00%. HEENT - clear Neck - incision clear and dry; minimal soft tissue swelling; voice soft but normal Chest - clear Cor - RRR  Disposition: Home with family  Discharge Orders    Future Appointments: Provider: Department: Dept Phone: Center:   09/08/2011 3:30 PM Velora Heckler, MD [facility] [phone] None     Future Orders Please Complete By Expires   Diet - low sodium heart healthy      Increase activity slowly      Discharge instructions      Comments:   [facility], [place] [phone]  THYROID & PARATHYROID SURGERY -- POST OP INSTRUCTIONS  Always review your discharge instruction sheet from the facility where your surgery was performed.  A prescription for pain medication may be given to you upon discharge.  Take your pain medication as prescribed.  If narcotic pain medicine is not needed, then you may take acetaminophen (Tylenol) or ibuprofen (Advil) as needed. Take your usually prescribed medications unless otherwise directed. If you need a refill on your pain medication,  please contact your pharmacy. They will contact our office to request authorization.  Prescriptions will not be processed after 5 pm or on weekends. Start with a light diet upon arrival home, such as soup and crackers or toast.  Be sure to drink plenty of fluids daily.  Resume your normal diet the day after surgery. Most patients will experience some swelling and bruising on the chest and neck area.  Ice packs will help.  Swelling and bruising can take several days to resolve.  It is common to experience some constipation if taking pain medication after surgery.  Increasing fluid intake and taking a stool softener will usually help or prevent this problem.  A mild laxative (Milk of Magnesia or Miralax) should be taken according to package directions if there are no bowel movements after 48 hours. You may remove your bandages 24-48 hours after surgery, and you may shower at that time.  You have steri-strips (small skin tapes) in place directly over the incision.  These strips should be left on the skin for 7-10 days and then removed. You may resume regular (light) daily activities beginning the next day-such as daily self-care, walking, climbing stairs-gradually increasing activities as tolerated.  You may have sexual intercourse when it is comfortable.  Refrain from any heavy lifting or straining until approved by your doctor.  You may drive when you no longer are taking prescription pain medication, you can comfortably wear a seatbelt, and you can safely maneuver your car and apply brakes. You should see your doctor in the office for a follow-up appointment approximately two to three weeks after your surgery.  Make sure that you call for  this appointment within a day or two after you arrive home to insure a convenient appointment time.  WHEN TO CALL YOUR DOCTOR: Fever greater than 101.5 Inability to urinate Nausea and/or vomiting - persistent Extreme swelling or bruising Continued bleeding from  incision Increased pain, redness, or drainage from the incision Difficulty swallowing or breathing Muscle cramping or spasms Numbness or tingling in hands or around lips  The clinic staff is available to answer your questions during regular business hours.  Please don't hesitate to call and ask to speak to one of the nurses if you have concerns. [facility], [place] [phone]  [email or web address]   Remove dressing in 24 hours      Apply dressing      Comments:   Apply light gauze dressing to neck now.     Medication List  As of 08/15/2011  8:24 AM   TAKE these medications         amLODipine 5 MG tablet   Commonly known as: NORVASC   Take 10 mg by mouth every morning.      aspirin EC 81 MG tablet   Take 81 mg by mouth every morning.      calcium carbonate 500 MG chewable tablet   Commonly known as: TUMS - dosed in mg elemental calcium   Chew 2 tablets (400 mg of elemental calcium total) by mouth 3 (three) times daily.      dorzolamide-timolol 22.3-6.8 MG/ML ophthalmic solution   Commonly known as: COSOPT   Place 1 drop into both eyes 2 (two) times daily.      hydrochlorothiazide 12.5 MG capsule   Commonly known as: MICROZIDE   Take 12.5 mg by mouth daily as needed. For excess water.      HYDROcodone-acetaminophen 5-325 MG per tablet   Commonly known as: NORCO/VICODIN   Take 1-2 tablets by mouth every 4 (four) hours as needed for pain.      LIVALO 2 MG Tabs   Generic drug: Pitavastatin Calcium   Take by mouth every morning. 1/2 TABLET EVERY AM      multivitamins ther. w/minerals Tabs   Take 1 tablet by mouth daily.      nitroGLYCERIN 0.4 MG SL tablet   Commonly known as: NITROSTAT   Place 0.4 mg under the tongue every 5 (five) minutes as needed. For chest pain.      oxyCODONE-acetaminophen 5-325 MG per tablet   Commonly known as: PERCOCET/ROXICET   Take 1 tablet by mouth every 4 (four) hours as needed. PT STATES RX NOT FILLED      SYNTHROID  100 MCG tablet   Generic drug: levothyroxine   Take 1 tablet (100 mcg total) by mouth daily.           Velora Heckler, MD, [facility] Office: [phone]    Signed: Velora Heckler 08/15/2011, 8:24 AM

## 2011-08-15 NOTE — Op Note (Signed)
Yvonne Tran, Yvonne Tran NO.:  [id number]  MEDICAL RECORD NO.:  [id number]  LOCATION:  1525                         FACILITY:  [facility]  PHYSICIAN:  Velora Heckler, MD      DATE OF BIRTH:  1934-04-11  DATE OF PROCEDURE:  08/14/2011                               OPERATIVE REPORT   PREOPERATIVE DIAGNOSIS:  Multinodular goiter with compressive symptoms.  POSTOPERATIVE DIAGNOSIS:  Multinodular goiter with compressive symptoms.  PROCEDURE:  Total thyroidectomy.  SURGEON:  Velora Heckler, MD, FACS  ANESTHESIA:  General per Phillips Grout, MD  ESTIMATED BLOOD LOSS:  Minimal.  PREPARATION:  ChloraPrep.  COMPLICATIONS:  None.  INDICATIONS:  The patient is a 77 year old, black female, referred by her primary care physician, with a multinodular thyroid goiter and compressive symptoms.  After preoperative evaluation, the patient was prepared and brought to the operating room for total thyroidectomy.  BODY OF REPORT:  Procedure was done in OR #1 at the [facility].  The patient was brought to the operating room, placed in supine position on the operating room table.  Following administration of general anesthesia, the patient was positioned and then prepped and draped in the usual strict aseptic fashion.  After ascertaining that, an adequate level of anesthesia had been achieved, a Kocher incision was made with a #15 blade.  Dissection was carried through subcutaneous tissues and platysma.  Hemostasis was obtained with electrocautery.  Skin flaps were elevated cephalad and caudad from the thyroid notch to the sternal notch.  The Mahorner self-retaining retractor was placed for exposure.  Strap muscles were incised in the midline and reflected to the left exposing a markedly enlarged left thyroid lobe.  Venous tributaries were divided between small and medium Ligaclips with the Harmonic scalpel.  Gland was gently mobilized with blunt dissection.   Superior pole vessels are dissected out and divided between medium Ligaclips with the Harmonic scalpel.  Superior parathyroid gland was identified and preserved on its vascular pedicle. Gland was rolled anteriorly.  Branches of the inferior thyroid artery were divided between small Ligaclips with the Harmonic scalpel. Recurrent laryngeal nerve was identified and preserved.  Ligament of Allyson Sabal was released with the electrocautery and the lobe was mobilized up and onto the anterior surface of the trachea.  There was a moderate- sized pyramidal lobe, which was dissected off of the anterior thyroid cartilage and resected en bloc with thyroid isthmus.  The isthmus was mobilized across the midline with the electrocautery.  Inferior venous tributaries were divided between medium Ligaclips with the Harmonic scalpel. Next, our attention was turned to the right side.  Strap muscles were reflected laterally exposing an enlarged right thyroid lobe.  This appears to be multinodular.  Venous tributaries were divided between Ligaclips with the Harmonic scalpel.  Superior pole vessels were dissected out and divided between medium Ligaclips with the Harmonic scalpel.  Superior parathyroid gland was again identified and preserved. Gland was rolled anteriorly.  Inferior venous tributaries were divided between Ligaclips with the Harmonic scalpel.  Branches of the inferior thyroid artery were divided between small Ligaclips with the Harmonic scalpel.  Recurrent laryngeal nerve was again identified and preserved along its course.  Ligament of Allyson Sabal was released with  the electrocautery and the gland was excised off the anterior trachea using the Harmonic scalpel for hemostasis.  Suture was used to mark the left superior pole.  The entire thyroid gland was submitted to pathology for review.  Neck was irrigated with warm saline.  Good hemostasis was noted bilaterally.  Surgicel was placed in the operative field  bilaterally. Strap muscles were reapproximated in the midline with interrupted 3-0 Vicryl sutures.  Platysma was closed with interrupted 3-0 Vicryl sutures.  The skin was closed with a running 4-0 Monocryl subcuticular suture.  Wound was washed and dried and benzoin and Steri-Strips were applied.  Sterile dressings were applied.  The patient was awakened from anesthesia and brought to the recovery room.  The patient tolerated the procedure well.  Velora Heckler, MD, FACS General & Endocrine Surgery [facility] Office: [phone]  TMG/MEDQ  D:  08/14/2011  T:  08/15/2011  Job:  [id number]  cc:   Pam Drown, M.D. Fax: [phone]

## 2011-08-17 ENCOUNTER — Encounter (HOSPITAL_COMMUNITY): Payer: Self-pay | Admitting: Surgery

## 2011-08-18 ENCOUNTER — Telehealth (INDEPENDENT_AMBULATORY_CARE_PROVIDER_SITE_OTHER): Payer: Self-pay

## 2011-08-18 NOTE — Progress Notes (Signed)
Quick Note:  Please contact patient and notify of benign pathology results.  Yvonne Tran M. Kylor Valverde, MD, FACS [facility] Office: [phone]   ______ 

## 2011-08-18 NOTE — Telephone Encounter (Signed)
Pt notified of path result per Dr Gerkin's request. 

## 2011-09-03 ENCOUNTER — Other Ambulatory Visit (INDEPENDENT_AMBULATORY_CARE_PROVIDER_SITE_OTHER): Payer: Self-pay | Admitting: Surgery

## 2011-09-03 LAB — CALCIUM: Calcium: 9.9 mg/dL (ref 8.4–10.5)

## 2011-09-08 ENCOUNTER — Ambulatory Visit (INDEPENDENT_AMBULATORY_CARE_PROVIDER_SITE_OTHER): Payer: Medicare Other | Admitting: Surgery

## 2011-09-08 ENCOUNTER — Encounter (INDEPENDENT_AMBULATORY_CARE_PROVIDER_SITE_OTHER): Payer: Self-pay | Admitting: Surgery

## 2011-09-08 VITALS — BP 158/80 | HR 72 | Temp 97.3°F | Resp 16 | Ht 65.0 in | Wt 208.8 lb

## 2011-09-08 DIAGNOSIS — E042 Nontoxic multinodular goiter: Secondary | ICD-10-CM

## 2011-09-08 DIAGNOSIS — E890 Postprocedural hypothyroidism: Secondary | ICD-10-CM

## 2011-09-08 NOTE — Patient Instructions (Signed)
  COCOA BUTTER & VITAMIN E CREAM  (Palmer's or other brand)  Apply cocoa butter/vitamin E cream to your incision 2 - 3 times daily.  Massage cream into incision for one minute with each application.  Use sunscreen (50 SPF or higher) for first 6 months after surgery if area is exposed to sun.  You may substitute Mederma or other scar reducing creams as desired.   

## 2011-09-08 NOTE — Progress Notes (Signed)
General Surgery [facility]  Visit Diagnoses: 1. Multinodular goiter (nontoxic)   2. Hypothyroidism, postsurgical     HISTORY: Patient is a 77 year old black female who underwent total thyroidectomy on 08/14/2011 for multinodular goiter. Final pathologic results were benign. Followup serum calcium level is normal at 9.9. Patient returns today for wound check. She is taking Synthroid 100 mcg daily.  EXAM: Surgical wound is healed nicely. Steri-Strips are removed in the office. Minimal soft tissue swelling. No sign of seroma. No sign of infection. Voice quality is normal.  IMPRESSION: Status post total thyroidectomy for multinodular goiter  PLAN: Patient will continue on Synthroid 100 mcg daily. We will check a TSH level in 2 weeks. Patient will begin applying topical creams to her incision. She may discontinue supplemental calcium at this time. She will return to see me for a final wound check in 6 weeks.  Velora Heckler, MD, FACS General & Endocrine Surgery [facility]

## 2011-09-29 ENCOUNTER — Other Ambulatory Visit (INDEPENDENT_AMBULATORY_CARE_PROVIDER_SITE_OTHER): Payer: Self-pay | Admitting: Surgery

## 2011-09-30 LAB — TSH: TSH: 1.416 u[IU]/mL (ref 0.350–4.500)

## 2011-10-19 ENCOUNTER — Encounter (INDEPENDENT_AMBULATORY_CARE_PROVIDER_SITE_OTHER): Payer: Self-pay | Admitting: Surgery

## 2011-10-19 ENCOUNTER — Ambulatory Visit (INDEPENDENT_AMBULATORY_CARE_PROVIDER_SITE_OTHER): Payer: Medicare Other | Admitting: Surgery

## 2011-10-19 VITALS — BP 140/90 | HR 66 | Temp 98.0°F | Resp 18 | Ht 65.0 in | Wt 216.6 lb

## 2011-10-19 DIAGNOSIS — E042 Nontoxic multinodular goiter: Secondary | ICD-10-CM

## 2011-10-19 DIAGNOSIS — E890 Postprocedural hypothyroidism: Secondary | ICD-10-CM

## 2011-10-19 MED ORDER — SYNTHROID 100 MCG PO TABS: 100.0000 ug | ORAL_TABLET | Freq: Every day | ORAL | Status: DC

## 2011-10-19 NOTE — Patient Instructions (Signed)
  COCOA BUTTER & VITAMIN E CREAM  (Palmer's or other brand)  Apply cocoa butter/vitamin E cream to your incision 2 - 3 times daily.  Massage cream into incision for one minute with each application.  Use sunscreen (50 SPF or higher) for first 6 months after surgery if area is exposed to sun.  You may substitute Mederma or other scar reducing creams as desired.   

## 2011-10-19 NOTE — Progress Notes (Signed)
General Surgery [facility]  Visit Diagnoses: 1. Multinodular goiter (nontoxic)   2. Hypothyroidism, postsurgical     HISTORY: Patient is a 77 year old black female who underwent total thyroidectomy for multinodular goiter in August 2013. She has done well postoperatively. She is taking Synthroid 100 mcg daily. A recent TSH level is normal at 1.416 on that dosage.  EXAM: Surgical incision is nicely healed with a good cosmetic result. There is a small amount of keloid formation. There is minimal tenderness. There is no sign of seroma. There is no sign of infection.  IMPRESSION: Status post total thyroidectomy for multinodular goiter  PLAN: Patient will remain on Synthroid 100 mcg daily. I will ask her primary care physician to check a TSH level in approximately 6 months and adjust her dosage necessary. I have renewed the patient's prescriptions.  Patient will return to see me in this office as needed.  Velora Heckler, MD, FACS General & Endocrine Surgery [facility]

## 2011-11-05 ENCOUNTER — Ambulatory Visit (INDEPENDENT_AMBULATORY_CARE_PROVIDER_SITE_OTHER): Payer: Medicare Other | Admitting: General Surgery

## 2011-11-05 ENCOUNTER — Encounter (INDEPENDENT_AMBULATORY_CARE_PROVIDER_SITE_OTHER): Payer: Self-pay | Admitting: General Surgery

## 2011-11-05 VITALS — BP 142/70 | HR 68 | Temp 97.0°F | Resp 20 | Ht 65.0 in | Wt 205.0 lb

## 2011-11-05 DIAGNOSIS — Z09 Encounter for follow-up examination after completed treatment for conditions other than malignant neoplasm: Secondary | ICD-10-CM

## 2011-11-05 NOTE — Progress Notes (Signed)
Subjective:     Patient ID: Yvonne Tran, female   DOB: 1934/05/23, 77 y.o.   MRN: [id number]  HPI 5 yof who is now about 6 weeks s/p total thyroid for mng. She is doing well except for soreness at her incision and what she describes as an odor associated with the incision.  She has no drainage, no fevers, no redness.  She complaints of numbness above her incision also and occasionally some difficulty with getting a deep breath.  She is otherwise well. She is applying alcohol and palmers lotion three times daily.    Review of Systems     Objective:   Physical Exam Incision with healing scar without infection, there is no evidence stitch abscess, no hematoma/fluid collection    Assessment:     S/p total thyroid    Plan:     I think this is primarily a wound issue with lotion.  I see absolutely no evidence of this being an infection.  We discussed stopping lotion for now and just keeping clean and coming back to see Dr. Gerrit Friends in a week or so.

## 2011-11-17 ENCOUNTER — Ambulatory Visit (INDEPENDENT_AMBULATORY_CARE_PROVIDER_SITE_OTHER): Payer: Medicare Other | Admitting: Surgery

## 2011-11-17 ENCOUNTER — Encounter (INDEPENDENT_AMBULATORY_CARE_PROVIDER_SITE_OTHER): Payer: Self-pay | Admitting: Surgery

## 2011-11-17 VITALS — BP 210/98 | HR 60 | Temp 97.4°F | Resp 16 | Ht 65.0 in | Wt 212.2 lb

## 2011-11-17 DIAGNOSIS — E042 Nontoxic multinodular goiter: Secondary | ICD-10-CM

## 2011-11-17 DIAGNOSIS — E890 Postprocedural hypothyroidism: Secondary | ICD-10-CM

## 2011-11-17 NOTE — Progress Notes (Signed)
General Surgery [facility]  Visit Diagnoses: 1. Multinodular goiter (nontoxic)   2. Hypothyroidism, postsurgical     HISTORY: Patient underwent total thyroidectomy for benign multinodular thyroid in early August 2013. Postoperative course has been uncomplicated. She is currently taking Synthroid 100 mcg daily. She is scheduled to see her primary care physician tomorrow for laboratory work and for adjustment of her thyroid hormone dosage if necessary.  Patient presents today complaining about tenderness along the surgical wound. She complains about pain in the area of the thyroid bed. She complains particularly about a foul-smelling odor emanating from her surgical wound.  PERTINENT REVIEW OF SYSTEMS: Denies tremor. Denies palpitations. Complains of foul-smelling emanating from surgical wound.  EXAM: HEENT: normocephalic; pupils equal and reactive; sclerae clear; dentition good; mucous membranes moist NECK:  Well-healed surgical incision which is completely epithelialized; small mild keloid formation along a portion of the incision;symmetric on extension; no palpable anterior or posterior cervical lymphadenopathy; no supraclavicular masses; no tenderness CHEST: clear to auscultation bilaterally without rales, rhonchi, or wheezes CARDIAC: regular rate and rhythm without significant murmur; peripheral pulses are full EXT:  non-tender without edema; no deformity NEURO: no gross focal deficits; no sign of tremor   IMPRESSION: #1 status post total thyroidectomy for benign disease #2 post surgical hypothyroidism, on thyroid hormone replacement #3 mild keloid formation at surgical incision  PLAN: The patient and I discussed the issue of whether or not there was a smell emanating from her surgical wound. I saw no evidence of this during her examination here in our office today. The wound is well-healed with a good cosmetic result. There is mild keloid formation and I  suggested that she apply topical hydrocortisone 2% cream on a daily basis which may improve the keloid appearance and relieve some of her mild discomfort.  Patient will followup with her primary care physician for laboratory work and dosage adjustment of her thyroid hormone replacement as necessary.  Patient will return for followup in this office as needed.  Velora Heckler, MD, [facility] Office: [phone]

## 2011-11-17 NOTE — Patient Instructions (Signed)
Apply Hydrocortisone 2% Cream (over the counter), store brand is fine, once a day to surgical wound.  Velora Heckler, MD, [facility] Office: [phone]

## 2011-12-17 ENCOUNTER — Other Ambulatory Visit (HOSPITAL_COMMUNITY): Payer: Self-pay | Admitting: Otolaryngology

## 2011-12-17 DIAGNOSIS — M542 Cervicalgia: Secondary | ICD-10-CM

## 2011-12-17 DIAGNOSIS — E049 Nontoxic goiter, unspecified: Secondary | ICD-10-CM

## 2011-12-21 ENCOUNTER — Ambulatory Visit (HOSPITAL_COMMUNITY): Payer: Medicare Other

## 2012-01-04 ENCOUNTER — Ambulatory Visit (HOSPITAL_COMMUNITY)
Admission: RE | Admit: 2012-01-04 | Discharge: 2012-01-04 | Disposition: A | Discharge status: Home / Self Care | Payer: Medicare Other | Source: Ambulatory Visit | Attending: Otolaryngology | Admitting: Otolaryngology

## 2012-01-04 DIAGNOSIS — E049 Nontoxic goiter, unspecified: Secondary | ICD-10-CM | POA: Insufficient documentation

## 2012-01-04 DIAGNOSIS — M542 Cervicalgia: Secondary | ICD-10-CM | POA: Insufficient documentation

## 2012-01-04 LAB — CREATININE, SERUM
Creatinine, Ser: 0.93 mg/dL (ref 0.50–1.10)
GFR calc Af Amer: 68 mL/min — ABNORMAL LOW (ref >90)
GFR calc non Af Amer: 59 mL/min — ABNORMAL LOW (ref >90)

## 2012-01-04 LAB — BUN: BUN: 13 mg/dL (ref 6–23)

## 2012-01-04 MED ORDER — IOHEXOL 300 MG/ML  SOLN
100.0000 mL | Freq: Once | INTRAMUSCULAR | Status: AC | PRN
  Administered 2012-01-04: 100 mL via INTRAVENOUS

## 2012-06-07 ENCOUNTER — Other Ambulatory Visit: Payer: Self-pay | Admitting: Family Medicine

## 2012-06-07 ENCOUNTER — Other Ambulatory Visit: Payer: Self-pay

## 2012-06-07 DIAGNOSIS — Z1231 Encounter for screening mammogram for malignant neoplasm of breast: Secondary | ICD-10-CM

## 2012-07-12 ENCOUNTER — Ambulatory Visit
Admission: RE | Admit: 2012-07-12 | Discharge: 2012-07-12 | Disposition: A | Discharge status: Home / Self Care | Payer: Medicare Other | Source: Ambulatory Visit

## 2012-07-12 DIAGNOSIS — Z1231 Encounter for screening mammogram for malignant neoplasm of breast: Secondary | ICD-10-CM

## 2012-09-19 ENCOUNTER — Encounter (HOSPITAL_COMMUNITY): Payer: Self-pay | Admitting: Emergency Medicine

## 2012-09-19 ENCOUNTER — Emergency Department (HOSPITAL_COMMUNITY)
Admission: EM | Admit: 2012-09-19 | Discharge: 2012-09-19 | Disposition: A | Discharge status: Home / Self Care | Payer: Medicare Other | Attending: Emergency Medicine | Admitting: Emergency Medicine

## 2012-09-19 ENCOUNTER — Emergency Department (HOSPITAL_COMMUNITY): Payer: Medicare Other

## 2012-09-19 DIAGNOSIS — Z8709 Personal history of other diseases of the respiratory system: Secondary | ICD-10-CM | POA: Insufficient documentation

## 2012-09-19 DIAGNOSIS — R059 Cough, unspecified: Secondary | ICD-10-CM | POA: Insufficient documentation

## 2012-09-19 DIAGNOSIS — Z8673 Personal history of transient ischemic attack (TIA), and cerebral infarction without residual deficits: Secondary | ICD-10-CM | POA: Insufficient documentation

## 2012-09-19 DIAGNOSIS — I10 Essential (primary) hypertension: Secondary | ICD-10-CM | POA: Insufficient documentation

## 2012-09-19 DIAGNOSIS — Z9889 Other specified postprocedural states: Secondary | ICD-10-CM | POA: Insufficient documentation

## 2012-09-19 DIAGNOSIS — H548 Legal blindness, as defined in USA: Secondary | ICD-10-CM | POA: Insufficient documentation

## 2012-09-19 DIAGNOSIS — Z79899 Other long term (current) drug therapy: Secondary | ICD-10-CM | POA: Insufficient documentation

## 2012-09-19 DIAGNOSIS — Z9861 Coronary angioplasty status: Secondary | ICD-10-CM | POA: Insufficient documentation

## 2012-09-19 DIAGNOSIS — R05 Cough: Secondary | ICD-10-CM | POA: Insufficient documentation

## 2012-09-19 DIAGNOSIS — E785 Hyperlipidemia, unspecified: Secondary | ICD-10-CM | POA: Insufficient documentation

## 2012-09-19 DIAGNOSIS — Z7982 Long term (current) use of aspirin: Secondary | ICD-10-CM | POA: Insufficient documentation

## 2012-09-19 DIAGNOSIS — H409 Unspecified glaucoma: Secondary | ICD-10-CM | POA: Insufficient documentation

## 2012-09-19 LAB — CBC WITH DIFFERENTIAL/PLATELET
Basophils Absolute: 0.1 10*3/uL (ref 0.0–0.1)
Basophils Relative: 1 % (ref 0–1)
Eosinophils Absolute: 0.5 10*3/uL (ref 0.0–0.7)
Eosinophils Relative: 8 % — ABNORMAL HIGH (ref 0–5)
HCT: 35.3 % — ABNORMAL LOW (ref 36.0–46.0)
Hemoglobin: 11.7 g/dL — ABNORMAL LOW (ref 12.0–15.0)
Lymphocytes Relative: 28 % (ref 12–46)
Lymphs Abs: 1.7 10*3/uL (ref 0.7–4.0)
MCH: 28.2 pg (ref 26.0–34.0)
MCHC: 33.1 g/dL (ref 30.0–36.0)
MCV: 85.1 fL (ref 78.0–100.0)
Monocytes Absolute: 0.6 10*3/uL (ref 0.1–1.0)
Monocytes Relative: 9 % (ref 3–12)
Neutro Abs: 3.3 10*3/uL (ref 1.7–7.7)
Neutrophils Relative %: 54 % (ref 43–77)
Platelets: 307 10*3/uL (ref 150–400)
RBC: 4.15 MIL/uL (ref 3.87–5.11)
RDW: 15.2 % (ref 11.5–15.5)
WBC: 6.2 10*3/uL (ref 4.0–10.5)

## 2012-09-19 LAB — POCT I-STAT, CHEM 8
BUN: 14 mg/dL (ref 6–23)
Calcium, Ion: 1.19 mmol/L (ref 1.13–1.30)
Chloride: 106 mEq/L (ref 96–112)
Creatinine, Ser: 1.1 mg/dL (ref 0.50–1.10)
Glucose, Bld: 118 mg/dL — ABNORMAL HIGH (ref 70–99)
HCT: 38 % (ref 36.0–46.0)
Hemoglobin: 12.9 g/dL (ref 12.0–15.0)
Potassium: 3.6 mEq/L (ref 3.5–5.1)
Sodium: 143 mEq/L (ref 135–145)
TCO2: 23 mmol/L (ref 0–100)

## 2012-09-19 LAB — POCT I-STAT TROPONIN I: Troponin i, poc: 0.01 ng/mL (ref 0.00–0.08)

## 2012-09-19 MED ORDER — ALBUTEROL SULFATE HFA 108 (90 BASE) MCG/ACT IN AERS: 1.0000 | INHALATION_SPRAY | Freq: Four times a day (QID) | RESPIRATORY_TRACT | Status: DC | PRN

## 2012-09-19 MED ORDER — BENZONATATE 200 MG PO CAPS: 200.0000 mg | ORAL_CAPSULE | Freq: Three times a day (TID) | ORAL | Status: DC | PRN

## 2012-09-19 NOTE — ED Notes (Signed)
Pt arrived via EMS with a complaint of shortness of breath caused by a continual cough.  Pt has a productive cough that is producing white sputum.  Pt states she has had this cough for a month.

## 2012-09-19 NOTE — ED Provider Notes (Signed)
CSN: [id number]     Arrival date & time 09/19/12  0253 History   First MD Initiated Contact with Patient 09/19/12 [phone]     Chief Complaint  Patient presents with  . Shortness of Breath   (Consider location/radiation/quality/duration/timing/severity/associated sxs/prior Treatment) Patient is a 78 y.o. female presenting with shortness of breath. The history is provided by the patient.  Shortness of Breath Severity:  Mild Onset quality:  Gradual Duration:  2 months Timing:  Constant Progression:  Unchanged Chronicity:  New Context: not URI   Relieved by:  Nothing Worsened by:  Nothing tried Ineffective treatments:  None tried Associated symptoms: sputum production   Associated symptoms: no diaphoresis, no fever, no hemoptysis, no neck pain, no sore throat and no wheezing   Risk factors: no recent surgery     Past Medical History  Diagnosis Date  . Hyperlipidemia   . Hypertension   . Lipoma   . Cardiac angina   . Heart attack     30 YRS AGO  DR VARANASI  [place] [facility]  . Stroke     MINI STROKE  YRS AGO   . Shortness of breath     WITH EXERTION   . Recurrent upper respiratory infection (URI) MAY 2013    RECENT COLD   . Goiter     THROID GOITER WITH COMPRESSION--PT HAVING TROUBLE SWALLOWING AND SOB  . Headache(784.0)     LEFT TEMPORAL AREA  . Pain     RIGHT THIGH FOR QUITE SOME TIME--LEG GIVES AWAY SOMETIMES.  LOWER BACK PAIN  - S/P HX OF PREVIOUS BACK SURGERY  . Glaucoma     GLAUCOMA IN   BOTH EYES:  LEGALLY BLIND IN RIGHT EYE  . Peripheral vascular disease     09/2008 STENT LEFT EXTERNAL ILIAC;  03/20/10 RE-STENT OF LEFT EXTERNAL ILIAC STENT- AT [facility] WITH DR. VARANASI   Past Surgical History  Procedure Laterality Date  . Cardiac catheterization  2012  . Eye surgery      left eye, lens replacement  . Vascular surgery      STENT PLACED 03/2010 RT LEG  . Mass excision  01/13/2011    Procedure: EXCISION MASS;  Surgeon: Almond Lint, MD;  Location: [facility] OR;  Service: General;   Laterality: Left;  Left abdominal wall mass (subcutaneous, 4-5 cm).  . Back surgery  1988    PT STATES AFTER THE BACK SURGERY PT HAS PARALYSIS LEFT ARM AND LEG--TOOK 2&1/2 YEARS TO REGAIN USE OF LEFT SIDE--LEFT SIDE REMAINS WEAKER THAN HER RIFHT SIDE.  Marland Kitchen Appendectomy    . Thyroidectomy  08/14/2011    Procedure: THYROIDECTOMY;  Surgeon: Velora Heckler, MD;  Location: [facility] ORS;  Service: General;  Laterality: N/A;  Total Thyroidectomy   Family History  Problem Relation Age of Onset  . Cancer Mother     brain  . Cancer Brother     prostate  . Anesthesia problems Neg Hx   . Hypotension Neg Hx   . Malignant hyperthermia Neg Hx   . Pseudochol deficiency Neg Hx    History  Substance Use Topics  . Smoking status: Never Smoker   . Smokeless tobacco: Never Used  . Alcohol Use: No   OB History   Grav Para Term Preterm Abortions TAB SAB Ect Mult Living                 Review of Systems  Constitutional: Negative for fever and diaphoresis.  HENT: Negative for sore throat and neck pain.  Respiratory: Positive for sputum production and shortness of breath. Negative for hemoptysis, chest tightness and wheezing.   Cardiovascular: Negative for palpitations and leg swelling.  All other systems reviewed and are negative.    Allergies  Crestor; Eggs or egg-derived products; Lemon juice; Milk-related compounds; and Shellfish allergy  Home Medications   Current Outpatient Rx  Name  Route  Sig  Dispense  Refill  . amLODipine (NORVASC) 5 MG tablet   Oral   Take 10 mg by mouth every morning.          Marland Kitchen aspirin EC 81 MG tablet   Oral   Take 81 mg by mouth every morning.          . brimonidine (ALPHAGAN) 0.15 % ophthalmic solution   Left Eye   Place 1 drop into the left eye 2 (two) times daily.         . dorzolamide-timolol (COSOPT) 22.3-6.8 MG/ML ophthalmic solution   Both Eyes   Place 1 drop into both eyes 2 (two) times daily.          . hydrochlorothiazide (MICROZIDE) 12.5 MG  capsule   Oral   Take 12.5 mg by mouth daily as needed. For excess water.         . latanoprost (XALATAN) 0.005 % ophthalmic solution   Both Eyes   Place 1 drop into both eyes at bedtime.         . nitroGLYCERIN (NITROSTAT) 0.4 MG SL tablet   Sublingual   Place 0.4 mg under the tongue every 5 (five) minutes as needed. For chest pain.         . Pitavastatin Calcium (LIVALO) 4 MG TABS   Oral   Take 2 mg by mouth daily. Take half a tab to equal 2mg          . promethazine (PHENERGAN) 25 MG tablet   Oral   Take 25 mg by mouth every 6 (six) hours as needed for nausea (nausea).         . SYNTHROID 100 MCG tablet   Oral   Take 1 tablet (100 mcg total) by mouth daily.   30 tablet   3     Dispense as written.    BP 148/72  Pulse 85  Temp(Src) 98.3 F (36.8 C) (Oral)  Resp 20  SpO2 100% Physical Exam  Constitutional: She is oriented to person, place, and time. She appears well-developed and well-nourished. No distress.  HENT:  Head: Normocephalic and atraumatic.  Mouth/Throat: Oropharynx is clear and moist.  Eyes: Conjunctivae are normal. Pupils are equal, round, and reactive to light.  Neck: Normal range of motion. Neck supple.  Cardiovascular: Normal rate, regular rhythm and intact distal pulses.   Pulmonary/Chest: No stridor. She has decreased breath sounds. She has no wheezes. She has no rales. She exhibits no tenderness.  Abdominal: Soft. Bowel sounds are normal. There is no tenderness. There is no rebound and no guarding.  Musculoskeletal: Normal range of motion.  Neurological: She is alert and oriented to person, place, and time.  Skin: Skin is warm and dry.  Psychiatric: She has a normal mood and affect.    ED Course  Procedures (including critical care time) Labs Review Labs Reviewed  CBC WITH DIFFERENTIAL   Imaging Review No results found.  MDM  No diagnosis found.  Date: 09/19/2012  Rate:74  Rhythm: normal sinus rhythm  QRS Axis: normal   Intervals: normal  ST/T Wave abnormalities: normal  Conduction Disutrbances: none  Narrative Interpretation: unremarkable    In the setting of ongoing symptoms of > 8 hrs duration.  One negative EKG and troponin excludes ACS Cough for 2 month duration, suspect allergic component.  Will treat with tessalon and claritin and prn inhaler.  Follow up with your PMD for ongoing care  Ziad Maye K Gabriele Loveland-Rasch, MD 09/19/12 [phone]

## 2012-09-19 NOTE — ED Notes (Signed)
Bed: ZO10 Expected date:  Expected time:  Means of arrival:  Comments: 49 F wheezing, VSS

## 2012-10-06 ENCOUNTER — Ambulatory Visit (INDEPENDENT_AMBULATORY_CARE_PROVIDER_SITE_OTHER): Payer: Medicare Other | Admitting: Internal Medicine

## 2012-10-06 ENCOUNTER — Encounter: Payer: Self-pay | Admitting: Internal Medicine

## 2012-10-06 ENCOUNTER — Institutional Professional Consult (permissible substitution): Payer: Medicare Other | Admitting: Internal Medicine

## 2012-10-06 VITALS — BP 154/90 | HR 80 | Temp 98.4°F | Ht 65.0 in | Wt 223.4 lb

## 2012-10-06 DIAGNOSIS — R05 Cough: Secondary | ICD-10-CM

## 2012-10-06 DIAGNOSIS — R059 Cough, unspecified: Secondary | ICD-10-CM

## 2012-10-06 MED ORDER — PANTOPRAZOLE SODIUM 40 MG PO TBEC: 40.0000 mg | DELAYED_RELEASE_TABLET | Freq: Every day | ORAL | Status: DC

## 2012-10-06 MED ORDER — TRAMADOL HCL 50 MG PO TABS: ORAL_TABLET | ORAL | Status: DC

## 2012-10-06 MED ORDER — PREDNISONE (PAK) 10 MG PO TABS: ORAL_TABLET | ORAL | Status: DC

## 2012-10-06 NOTE — Patient Instructions (Addendum)
The key to effective treatment for your cough is eliminating the non-stop cycle of cough you're stuck in long enough to let your airway heal completely and then see if there is anything still making you cough once you stop the cough suppression, but this should take no more than 5 days to figure out  First take delsym two tsp every 12 hours and supplement if needed with  tramadol 50 mg up to 2 every 4 hours to suppress the urge to cough at all or even clear your throat. Swallowing water or using ice chips/non mint and menthol containing candies (such as lifesavers or sugarless jolly ranchers) are also effective.  You should rest your voice and avoid activities that you know make you cough.  Once you have eliminated the cough for 3 straight days try reducing the tramadol first,  then the delsym as tolerated.    Pantoprazole (protonix) 40 mg   Take 30-60 min before first meal of the day and Pepcid ac 20 mg one bedtime and chlortrimeton 4 mg at bedtime until return to office - this is the best way to tell whether stomach acid is contributing to your problem.    GERD (REFLUX)  is an extremely common cause of respiratory symptoms, many times with no significant heartburn at all.    It can be treated with medication, but also with lifestyle changes including avoidance of late meals, excessive alcohol, smoking cessation, and avoid fatty foods, chocolate, peppermint, colas, red wine, and acidic juices such as orange juice.  NO MINT OR MENTHOL PRODUCTS SO NO COUGH DROPS  USE SUGARLESS CANDY INSTEAD (jolley ranchers or Stover's)  NO OIL BASED VITAMINS - use powdered substitutes.    Prednisone 10 mg take  4 each am x 2 days,   2 each am x 2 days,  1 each am x 2 days and stop    Please schedule a follow up office visit in 2 weeks, sooner if needed

## 2012-10-06 NOTE — Progress Notes (Signed)
Subjective:    Patient ID: Yvonne Tran, female    DOB: 07/19/1934 MRN: [id number]  HPI  74 yobf never smoker from [place] moved to [place] around 2004 s/p thyroid surgery for goiter 08/14/11 which had caused breathing problems but no cough then sev months later around fall of 2013 progressively worse cough esp since summer 2014 referred by Dr Uvaldo Rising to pulmonary clinic 10/06/2012   10/06/2012 1st [facility] office visit/ Yvonne Tran Chief Complaint  Patient presents with  . Pulmonary Consult    Referred per Dr. Gweneth Dimitri.  Pt c/o cough x 3 months. Cough is prod with thick, white sputum.  Cough is sometimes worse after she drinks water. She also c/o CP and SOB when she coughs and sometimes with exertion.    incessant coughing prod of white phlegm so severe vomit and urinary incont no better with tessalon or albuterol, feels choked and food gets suck in throat (points to mid/high neck level)  "nothing ever helps", present 24/7 ? Less when sleeping, looses breath and has diffuse chest discomfort anteriorly just with the coughing fits.   No obvious pattern in day to day or daytime variabilty or assoc   chest tightness, subjective wheeze overt sinus or hb symptoms. No unusual exp hx or h/o childhood pna/ asthma or knowledge of premature birth.  Sleeping ok without nocturnal  or early am exacerbation  of respiratory  c/o's or need for noct saba. Also denies any obvious fluctuation of symptoms with weather or environmental changes or other aggravating or alleviating factors except as outlined above   Current Medications, Allergies, Complete Past Medical History, Past Surgical History, Family History, and Social History were reviewed in [employer] record.          Review of Systems  Constitutional: Negative for fever, chills and unexpected weight change.  HENT: Positive for congestion, sneezing and trouble swallowing. Negative for ear pain, nosebleeds, sore  throat, rhinorrhea, dental problem, voice change, postnasal drip and sinus pressure.   Eyes: Negative for visual disturbance.  Respiratory: Positive for cough and shortness of breath. Negative for choking.   Cardiovascular: Positive for chest pain and leg swelling.  Gastrointestinal: Positive for abdominal pain. Negative for vomiting and diarrhea.  Genitourinary: Negative for difficulty urinating.  Musculoskeletal: Positive for arthralgias.  Skin: Negative for rash.  Neurological: Positive for headaches. Negative for tremors and syncope.  Hematological: Does not bruise/bleed easily.       Objective:   Physical Exam  amb bm with incessant barking type cough Wt Readings from Last 3 Encounters:  10/06/12 223 lb 6.4 oz (101.334 kg)  11/17/11 212 lb 3.2 oz (96.253 kg)  11/05/11 205 lb (92.987 kg)      HEENT: nl dentition, turbinates, and orophanx. Nl external ear canals without cough reflex   NECK :  without JVD/Nodes/TM/ nl carotid upstrokes bilaterally   LUNGS: no acc muscle use, clear to A and P bilaterally without cough on insp or exp maneuvers   CV:  RRR  no s3 or murmur or increase in P2, no edema   ABD:  soft and nontender with nl excursion in the supine position. No bruits or organomegaly, bowel sounds nl  MS:  warm without deformities, calf tenderness, cyanosis or clubbing  SKIN: warm and dry without lesions    NEURO:  alert, approp, no deficits   01/03/13 CT neck Negative post thyroidectomy CT neck. No postoperative  complications or residual thyroid tissue is observed. Unremarkable  appearing surgical site.  cxr 09/19/12 No active cardiopulmonary disease.     Assessment & Plan:

## 2012-10-07 DIAGNOSIS — R059 Cough, unspecified: Secondary | ICD-10-CM | POA: Insufficient documentation

## 2012-10-07 DIAGNOSIS — R05 Cough: Secondary | ICD-10-CM | POA: Insufficient documentation

## 2012-10-07 NOTE — Assessment & Plan Note (Addendum)
The most common causes of chronic cough in immunocompetent adults include the following: upper airway cough syndrome (UACS), previously referred to as postnasal drip syndrome (PNDS), which is caused by variety of rhinosinus conditions; (2) asthma; (3) GERD; (4) chronic bronchitis from cigarette smoking or other inhaled environmental irritants; (5) nonasthmatic eosinophilic bronchitis; and (6) bronchiectasis.   These conditions, singly or in combination, have accounted for up to 94% of the causes of chronic cough in prospective studies.   Other conditions have constituted no >6% of the causes in prospective studies These have included bronchogenic carcinoma, chronic interstitial pneumonia, sarcoidosis, left ventricular failure, ACEI-induced cough, and aspiration from a condition associated with pharyngeal dysfunction.    Chronic cough is often simultaneously caused by more than one condition. A single cause has been found from 38 to 82% of the time, multiple causes from 18 to 62%. Multiply caused cough has been the result of three diseases up to 42% of the time.      Clearly this is a case of  Classic Upper airway cough syndrome, so named because it's frequently impossible to sort out how much is  CR/sinusitis with freq throat clearing (which can be related to primary GERD)   vs  causing  secondary (" extra esophageal")  GERD from wide swings in gastric pressure that occur with throat clearing, often  promoting self use of mint and menthol lozenges that reduce the lower esophageal sphincter tone and exacerbate the problem further in a cyclical fashion.   These are the same pts (now being labeled as having "irritable larynx syndrome" by some cough centers) who not infrequently have a history of having failed to tolerate ace inhibitors,  dry powder inhalers or biphosphonates or report having atypical reflux symptoms that don't respond to standard doses of PPI , and are easily confused as having aecopd or  asthma flares by even experienced allergists/ pulmonologists.   For now need to try to eliminate cyclical cough then regroup in 2 weeks/ ENT eval early in w/u if not responding since this all started with ET/ thyroid surgery though note the neck CT looked fine in 12/2011 and she has no stridor at this point  See instructions for specific recommendations which were reviewed directly with the patient who was given a copy with highlighter outlining the key components.

## 2012-10-20 ENCOUNTER — Ambulatory Visit (INDEPENDENT_AMBULATORY_CARE_PROVIDER_SITE_OTHER): Payer: Medicare Other | Admitting: Internal Medicine

## 2012-10-20 ENCOUNTER — Encounter: Payer: Self-pay | Admitting: Internal Medicine

## 2012-10-20 VITALS — BP 116/80 | HR 60 | Temp 98.3°F | Ht 65.0 in | Wt 221.8 lb

## 2012-10-20 DIAGNOSIS — R05 Cough: Secondary | ICD-10-CM

## 2012-10-20 DIAGNOSIS — R059 Cough, unspecified: Secondary | ICD-10-CM

## 2012-10-20 NOTE — Progress Notes (Signed)
Subjective:    Patient ID: Yvonne Tran, female    DOB: [birth year] MRN: [id number]   Brief patient profile:   33 yobf never smoker from [place] moved to [place] around 2004 s/p thyroid surgery for goiter 08/14/11 which had caused breathing problems but no cough then sev months later around fall of 2013 progressively worse cough esp since summer 2014 referred by Yvonne Tran to pulmonary clinic 10/06/2012   10/06/2012 1st [facility] office visit/ Yvonne Tran Chief Complaint  Patient presents with  . Pulmonary Consult    Referred per Yvonne. Gweneth Tran.  Pt c/o cough x 3 months. Cough is prod with thick, white sputum.  Cough is sometimes worse after she drinks water. She also c/o CP and SOB when she coughs and sometimes with exertion.  incessant coughing prod of white phlegm so severe vomit and urinary incont no better with tessalon or albuterol, feels choked and food gets suck in throat (points to mid/high neck level)  "nothing ever helps", present 24/7 ? Less when sleeping, looses breath and has diffuse chest discomfort anteriorly just with the coughing fits.  rec  First take delsym two tsp every 12 hours and supplement if needed with  tramadol 50 mg up to 2 every 4 hours to suppress the urge to cough  Once you have eliminated the cough for 3 straight days try reducing the tramadol first,  then the delsym as tolerated.   Pantoprazole (protonix) 40 mg   Take 30-60 min before first meal of the day and Pepcid ac 20 mg one bedtime and chlortrimeton 4 mg at bedtime until return to office - this is the best way to tell whether stomach acid is contributing to your problem.   GERD diet    10/20/2012 f/u ov/Yvonne Tran re: chronic cough Chief Complaint  Patient presents with  . Follow-up    Pt states cough is much improved since her last visit. She states that she is taking tramadol about twice per wk for abd pain that she relates to taking protonix.       No obvious day to day or daytime variabilty  or assoc  Sob  or cp or chest tightness, subjective wheeze overt sinus or hb symptoms. No unusual exp hx or h/o childhood pna/ asthma or knowledge of premature birth.  Sleeping ok without nocturnal  or early am exacerbation  of respiratory  c/o's or need for noct saba. Also denies any obvious fluctuation of symptoms with weather or environmental changes or other aggravating or alleviating factors except as outlined above   Current Medications, Allergies, Complete Past Medical History, Past Surgical History, Family History, and Social History were reviewed in Yvonne Tran record.  ROS  The following are not active complaints unless bolded sore throat, dysphagia, dental problems, itching, sneezing,  nasal congestion or excess/ purulent secretions, ear ache,   fever, chills, sweats, unintended wt loss, pleuritic or exertional cp, hemoptysis,  orthopnea pnd or leg swelling, presyncope, palpitations, heartburn, abdominal pain, anorexia, nausea, vomiting, diarrhea  or change in bowel or urinary habits, change in stools or urine, dysuria,hematuria,  rash, arthralgias, visual complaints, headache, numbness weakness or ataxia or problems with walking or coordination,  change in mood/affect or memory.                    Objective:   Physical Exam  amb bm all smiles  10/20/2012    221  Wt Readings from Last 3 Encounters:  10/06/12 223 lb 6.4  oz (101.334 kg)  11/17/11 212 lb 3.2 oz (96.253 kg)  11/05/11 205 lb (92.987 kg)      HEENT: nl dentition, turbinates, and orophanx. Nl external ear canals without cough reflex   NECK :  without JVD/Nodes/TM/ nl carotid upstrokes bilaterally   LUNGS: no acc muscle use, clear to A and P bilaterally without cough on insp or exp maneuvers   CV:  RRR  no s3 or murmur or increase in P2, no edema   ABD:  soft and nontender with nl excursion in the supine position. No bruits or organomegaly, bowel sounds nl  MS:  warm without  deformities, calf tenderness, cyanosis or clubbing  SKIN: warm and dry without lesions        01/03/13 CT neck Negative post thyroidectomy CT neck. No postoperative  complications or residual thyroid tissue is observed. Unremarkable  appearing surgical site.  cxr 09/19/12 No active cardiopulmonary disease.     Assessment & Plan:

## 2012-10-20 NOTE — Patient Instructions (Signed)
Continue the protonix /pepcid/chlortrimeton until your next visit with Dr Uvaldo Rising and she can advise you on how to taper off  Return if cough returns while on the above combination as it would indicate there's something else besides acid causing the cough   Remember the diet restrictions also - good luck!

## 2012-10-23 NOTE — Assessment & Plan Note (Signed)
Marked improvement with treatment directed at upper airway cough syndrome/ cyclical cough so unlikely to have another source.  Ideally would like to see complete elimination of all cough and all need for cough suppression then taper off the acid suppression but emphasized need to maintain diet.

## 2012-11-09 ENCOUNTER — Encounter: Payer: Self-pay | Admitting: Interventional Cardiology

## 2012-11-22 ENCOUNTER — Other Ambulatory Visit: Payer: Self-pay | Admitting: *Deleted

## 2012-11-22 DIAGNOSIS — E782 Mixed hyperlipidemia: Secondary | ICD-10-CM

## 2012-11-22 DIAGNOSIS — Z79899 Other long term (current) drug therapy: Secondary | ICD-10-CM

## 2012-12-12 ENCOUNTER — Ambulatory Visit (INDEPENDENT_AMBULATORY_CARE_PROVIDER_SITE_OTHER): Payer: Medicare Other | Admitting: *Deleted

## 2012-12-12 DIAGNOSIS — E782 Mixed hyperlipidemia: Secondary | ICD-10-CM

## 2012-12-12 DIAGNOSIS — Z79899 Other long term (current) drug therapy: Secondary | ICD-10-CM

## 2012-12-12 LAB — LDL CHOLESTEROL, DIRECT: Direct LDL: 196.5 mg/dL

## 2012-12-12 LAB — LIPID PANEL
Cholesterol: 260 mg/dL — ABNORMAL HIGH (ref 0–200)
HDL: 56.3 mg/dL (ref >39.00)
Total CHOL/HDL Ratio: 5
Triglycerides: 64 mg/dL (ref 0.0–149.0)
VLDL: 12.8 mg/dL (ref 0.0–40.0)

## 2012-12-12 LAB — ALT: ALT: 13 U/L (ref 0–35)

## 2012-12-20 ENCOUNTER — Other Ambulatory Visit: Payer: Self-pay | Admitting: Interventional Cardiology

## 2012-12-20 NOTE — Telephone Encounter (Signed)
Amy, have you been able to reach this patient about her cholesterol panel yet?  If so, okay to give her samples of Livalo if we have any.  Please give her cholesterol results as well.

## 2012-12-20 NOTE — Telephone Encounter (Signed)
New message    Want samples of livalostatin ----pt has not seen dr v at the new office

## 2012-12-20 NOTE — Telephone Encounter (Signed)
Riki Rusk can you help me with this request, thanks.

## 2012-12-20 NOTE — Telephone Encounter (Signed)
Lmtrc, samples #14 of Livalo 4 mg will be up front for pt. She should be taking 1/2 tablet daily.

## 2012-12-21 NOTE — Telephone Encounter (Signed)
Follow Up  Pt returned call to discuss medication// Please call

## 2012-12-21 NOTE — Telephone Encounter (Signed)
Pt was called back and informed that samples were at front desk. She will pick them up tomorrow.

## 2012-12-27 ENCOUNTER — Telehealth: Payer: Self-pay | Admitting: Cardiology

## 2012-12-27 DIAGNOSIS — E785 Hyperlipidemia, unspecified: Secondary | ICD-10-CM

## 2012-12-27 MED ORDER — PITAVASTATIN CALCIUM 2 MG PO TABS: 2.0000 mg | ORAL_TABLET | Freq: Every day | ORAL | Status: DC

## 2012-12-27 NOTE — Telephone Encounter (Signed)
Message copied by Theda Sers on Tue Dec 27, 2012 12:01 PM ------      Message from: SMART, Gaspar Skeeters      Created: Thu Dec 22, 2012  5:01 PM       Good. Make sure patient continues Livalo 2 mg everyday, don't miss any doses.      Recheck lipid panel and hepatic panel in 3 months.      Please notify patient and set up labs. Thanks. ------

## 2012-12-27 NOTE — Telephone Encounter (Signed)
Pt notified. Meds updated and labs ordered.  

## 2013-01-31 ENCOUNTER — Encounter: Payer: Self-pay | Admitting: Interventional Cardiology

## 2013-02-02 ENCOUNTER — Ambulatory Visit: Payer: Medicare Other | Admitting: Interventional Cardiology

## 2013-02-10 ENCOUNTER — Telehealth: Payer: Self-pay | Admitting: Interventional Cardiology

## 2013-02-10 ENCOUNTER — Ambulatory Visit (INDEPENDENT_AMBULATORY_CARE_PROVIDER_SITE_OTHER): Payer: Medicare Other | Admitting: Interventional Cardiology

## 2013-02-10 ENCOUNTER — Encounter: Payer: Self-pay | Admitting: Interventional Cardiology

## 2013-02-10 VITALS — BP 144/82 | HR 77 | Ht 65.0 in | Wt 222.8 lb

## 2013-02-10 DIAGNOSIS — E782 Mixed hyperlipidemia: Secondary | ICD-10-CM | POA: Insufficient documentation

## 2013-02-10 DIAGNOSIS — M79609 Pain in unspecified limb: Secondary | ICD-10-CM | POA: Insufficient documentation

## 2013-02-10 DIAGNOSIS — I10 Essential (primary) hypertension: Secondary | ICD-10-CM

## 2013-02-10 DIAGNOSIS — E785 Hyperlipidemia, unspecified: Secondary | ICD-10-CM

## 2013-02-10 MED ORDER — PITAVASTATIN CALCIUM 2 MG PO TABS
2.0000 mg | ORAL_TABLET | Freq: Every day | ORAL | Status: DC
Start: 2013-02-10 — End: 2013-02-10

## 2013-02-10 MED ORDER — PITAVASTATIN CALCIUM 2 MG PO TABS
2.0000 mg | ORAL_TABLET | Freq: Every day | ORAL | Status: DC
Start: 2013-02-10 — End: 2014-02-14

## 2013-02-10 NOTE — Telephone Encounter (Signed)
New message  Patient went to [facility] and meds were not ready, please call & advise.

## 2013-02-10 NOTE — Progress Notes (Signed)
Patient ID: Yvonne Tran, female   DOB: [birth year], 79 y.o.   MRN: [id number]    [name], [place], [place]  [address] Phone: [phone] Fax:  [phone]  Date:  02/10/2013   ID:  Yvonne Tran, DOB 1934/12/23, MRN [id number]  PCP:  Cari Caraway, MD      History of Present Illness: Yvonne Tran is a 79 y.o. female who has had PAD. She has had left iliac stents placed. SHOB sx persist. She continues to have leg pains. She notes hip pains with walking. No chest pain. She has cramps in her legs, L>R. No left calf pain. Significant left hip pain. Worse at niht when she lies down on the left side.  That is the time when the pain is most severe.   No more difficulty breathing at night which causes her to sit up. Cough resolved. wheezing resolved as well.    Wt Readings from Last 3 Encounters:  02/10/13 222 lb 12.8 oz (101.061 kg)  10/20/12 221 lb 12.8 oz (100.608 kg)  10/06/12 223 lb 6.4 oz (101.334 kg)     Past Medical History  Diagnosis Date  . Hyperlipidemia   . Hypertension   . Lipoma   . Cardiac angina   . Heart attack     30 YRS AGO  DR Taksh Hjort  EAGLE CARD  . Stroke     MINI STROKE  YRS AGO   . Shortness of breath     WITH EXERTION   . Recurrent upper respiratory infection (URI) MAY 2013    RECENT COLD   . Goiter     THROID GOITER WITH COMPRESSION--PT HAVING TROUBLE SWALLOWING AND SOB  . Headache(784.0)     LEFT TEMPORAL AREA  . Pain     RIGHT THIGH FOR QUITE SOME TIME--LEG GIVES AWAY SOMETIMES.  LOWER BACK PAIN  - S/P HX OF PREVIOUS BACK SURGERY  . Glaucoma     GLAUCOMA IN   BOTH EYES:  LEGALLY BLIND IN RIGHT EYE  . Peripheral vascular disease     09/2008 STENT LEFT EXTERNAL ILIAC;  03/20/10 RE-STENT OF LEFT EXTERNAL ILIAC STENT- AT [facility] WITH DR. Heath Badon    Current Outpatient Prescriptions  Medication Sig Dispense Refill  . amLODipine (NORVASC) 5 MG tablet Take 10 mg by mouth every morning.       Marland Kitchen aspirin  EC 81 MG tablet Take 81 mg by mouth every morning.       . brimonidine (ALPHAGAN) 0.15 % ophthalmic solution Place 1 drop into the left eye 2 (two) times daily.      . dorzolamide-timolol (COSOPT) 22.3-6.8 MG/ML ophthalmic solution Place 1 drop into both eyes 2 (two) times daily.       . hydrochlorothiazide (MICROZIDE) 12.5 MG capsule Take 12.5 mg by mouth daily as needed. For excess water.      . latanoprost (XALATAN) 0.005 % ophthalmic solution Place 1 drop into both eyes at bedtime.      Marland Kitchen levothyroxine (SYNTHROID, LEVOTHROID) 88 MCG tablet Take 88 mcg by mouth daily before breakfast.      . nitroGLYCERIN (NITROSTAT) 0.4 MG SL tablet Place 0.4 mg under the tongue every 5 (five) minutes as needed. For chest pain.      . pantoprazole (PROTONIX) 40 MG tablet Take 1 tablet (40 mg total) by mouth daily. Take 30-60 min before first meal of the day  30 tablet  2  . Pitavastatin Calcium (LIVALO) 2 MG TABS Take  1 tablet (2 mg total) by mouth daily.  30 tablet    . promethazine (PHENERGAN) 25 MG tablet Take 25 mg by mouth every 6 (six) hours as needed for nausea (nausea).      . traMADol (ULTRAM) 50 MG tablet 1-2 every 4 hours as needed for cough or pain  40 tablet  0   No current facility-administered medications for this visit.    Allergies:    Allergies  Allergen Reactions  . Crestor [Rosuvastatin]     MYALGIAS  . Eggs Or Egg-Derived Products Itching, Nausea Only and Swelling  . Lemon Juice Itching, Nausea Only and Swelling  . Milk-Related Compounds Itching, Nausea Only and Swelling  . Shellfish Allergy Itching, Nausea Only and Swelling    Social History:  The patient  reports that she has never smoked. She has never used smokeless tobacco. She reports that she does not drink alcohol or use illicit drugs.   Family History:  The patient's family history includes Cancer in her brother and mother; Heart disease in her father. There is no history of Anesthesia problems, Hypotension, Malignant  hyperthermia, or Pseudochol deficiency.   ROS:  Please see the history of present illness.  No nausea, vomiting.  No fevers, chills.  No focal weakness.  No dysuria.    All other systems reviewed and negative.   PHYSICAL EXAM: VS:  BP 144/82  Pulse 77  Ht 5\' 5"  (1.651 m)  Wt 222 lb 12.8 oz (101.061 kg)  BMI 37.08 kg/m2  SpO2 96% Well nourished, well developed, in no acute distress HEENT: normal Neck: no JVD, no carotid bruits Cardiac:  normal S1, S2; RRR;  Lungs:  clear to auscultation bilaterally, no wheezing, rhonchi or rales Abd: soft, nontender, no hepatomegaly Ext: no edema, 2+ bilateral PT pulses Skin: warm and dry Neuro:   no focal abnormalities noted  EKG:      ASSESSMENT AND PLAN:  1.Essential hypertension, benign  Continue Norvasc Tablet, 10 MG, 1 tablet, Orally, Once a day, 90, Refills 3 Continue Hydrochlorothiazide Tablet, 12.5 MG, 1 tabs, Orally, Once a day, 90, Refills 3 Notes: Check BP at home. If consistently over 150/90, would add ACE-I. Borderline today. She has not been checking at home recently, but prior to this it was in the 148/78 range.  2. Peripheral Vascular Disease / hyperlipidemia Continue livalo  Tablet, 2 MG, 1 tablet every evening, Orally, Once a day, 30 day(s), 30, Refills 11 Notes: Watch for claudication or non healing ulcers in left leg. Biphasic wavefroms on the left side by u/s. May be some restenosis in the left iliac. Palpable PT pulses.   No indication for angiography. Did not tolerate Lipitor,Crestor or Lovastatin or simvastatin. Taking livalo. Last LDL 145 in 8/14.  Due for lipid recheck.  She has been out of the med for 2 weeks. WIll check lipids in 2 months after she has been back on the medicine consistently. 3. Limb pain  Notes: Returned.  Does not sound vascular in origin.  May be joint or nerve related since it is worse with lying on her left side.  She has to sleep on her right side.  Not related to exertion.    Signed, Mina Marble, MD, [facility] 02/10/2013 9:26 AM

## 2013-02-10 NOTE — Telephone Encounter (Signed)
Spoke with pt and sent Livalo rx to pharmacy.

## 2013-02-10 NOTE — Patient Instructions (Signed)
Your physician recommends that you return for a FASTING lipid profile: 04/10/13.  Your physician recommends that you continue on your current medications as directed. Please refer to the Current Medication list given to you today.  Your physician wants you to follow-up in: 1 Year with Dr. Irish Lack. You will receive a reminder letter in the mail two months in advance. If you don't receive a letter, please call our office to schedule the follow-up appointment.  Your Livalo 2 mg was refilled to [facility].

## 2013-03-22 ENCOUNTER — Other Ambulatory Visit: Payer: Medicare Other

## 2013-04-10 ENCOUNTER — Other Ambulatory Visit: Payer: Medicare Other

## 2013-10-10 ENCOUNTER — Ambulatory Visit: Payer: Medicare Other | Admitting: Interventional Cardiology

## 2013-10-16 ENCOUNTER — Encounter: Payer: Self-pay | Admitting: Interventional Cardiology

## 2013-11-23 ENCOUNTER — Encounter: Payer: Self-pay | Admitting: Interventional Cardiology

## 2014-02-14 ENCOUNTER — Ambulatory Visit (INDEPENDENT_AMBULATORY_CARE_PROVIDER_SITE_OTHER): Payer: Medicare Other | Admitting: Interventional Cardiology

## 2014-02-14 ENCOUNTER — Encounter: Payer: Self-pay | Admitting: Interventional Cardiology

## 2014-02-14 VITALS — BP 132/74 | HR 63 | Ht 65.0 in | Wt 216.0 lb

## 2014-02-14 DIAGNOSIS — I739 Peripheral vascular disease, unspecified: Secondary | ICD-10-CM

## 2014-02-14 DIAGNOSIS — E782 Mixed hyperlipidemia: Secondary | ICD-10-CM

## 2014-02-14 DIAGNOSIS — M79606 Pain in leg, unspecified: Secondary | ICD-10-CM

## 2014-02-14 DIAGNOSIS — I10 Essential (primary) hypertension: Secondary | ICD-10-CM

## 2014-02-14 NOTE — Patient Instructions (Addendum)
Your physician recommends that you continue on your current medications as directed. Please refer to the Current Medication list given to you today.  Your physician has requested that you have a lower extremity arterial duplex. This test is an ultrasound of the arteries in the legs or arms. It looks at arterial blood flow in the legs and arms. Allow one hour for Lower and Upper Arterial scans. There are no restrictions or special instructions   Your physician wants you to follow-up in: 1 year with Dr. Irish Lack. You will receive a reminder letter in the mail two months in advance. If you don't receive a letter, please call our office to schedule the follow-up appointment.

## 2014-02-14 NOTE — Progress Notes (Signed)
Patient ID: Yvonne Tran, female   DOB: October 25, 1934, 80 y.o.   MRN: [id number] Patient ID: Yvonne Tran, female   DOB: 30-Jun-1934, 80 y.o.   MRN: [id number]    [place], [place], [place]  [address] Phone: [phone] Fax:  [phone]  Date:  02/14/2014   ID:  Yvonne, Tran 1934-04-25, MRN [id number]  PCP:  Cari Caraway, MD      History of Present Illness: Yvonne Tran is a 80 y.o. female who has had PAD. She has had left iliac stents placed. SHOB sx persist. She continues to have leg pains. She has left thigh and calf pain , worse lying down better with standing.  She notes hip pains with walking. Chest pain episode 3 weeks ago and it was resolved with NTG. She has cramps in her legs, L>R. No left calf pain with walking.  Walking is limited by her balance and by low back pain. Significant right hip pain after a fall a few months ago.   That is the time when the pain is most severe.   No more difficulty breathing at night which causes her to sit up. She seeps on 1-3 pillows depending on her back. Cough resolved. Wheezing resolved as well.  Most strenuous activity is housekeeping. She has fatigue that limits her. Caths in 2009 and 2012 were clear of any significant CAD.  She uses NTG rarely.  It has reduced in frequency.    Wt Readings from Last 3 Encounters:  02/14/14 216 lb (97.977 kg)  02/10/13 222 lb 12.8 oz (101.061 kg)  10/20/12 221 lb 12.8 oz (100.608 kg)     Past Medical History  Diagnosis Date  . Hyperlipidemia   . Hypertension   . Lipoma   . Cardiac angina   . Heart attack     30 YRS AGO  DR Daliyah Sramek  [facility] CARD  . Stroke     MINI STROKE  YRS AGO   . Shortness of breath     WITH EXERTION   . Recurrent upper respiratory infection (URI) MAY 2013    RECENT COLD   . Goiter     THROID GOITER WITH COMPRESSION--PT HAVING TROUBLE SWALLOWING AND SOB  . Headache(784.0)     LEFT TEMPORAL AREA  . Pain    RIGHT THIGH FOR QUITE SOME TIME--LEG GIVES AWAY SOMETIMES.  LOWER BACK PAIN  - S/P HX OF PREVIOUS BACK SURGERY  . Glaucoma     GLAUCOMA IN   BOTH EYES:  LEGALLY BLIND IN RIGHT EYE  . Peripheral vascular disease     09/2008 STENT LEFT EXTERNAL ILIAC;  03/20/10 RE-STENT OF LEFT EXTERNAL ILIAC STENT- AT [facility] WITH DR. Kayzlee Wirtanen    Current Outpatient Prescriptions  Medication Sig Dispense Refill  . amLODipine (NORVASC) 5 MG tablet Take 10 mg by mouth every morning.     Marland Kitchen aspirin EC 81 MG tablet Take 81 mg by mouth every morning.     . brimonidine (ALPHAGAN) 0.15 % ophthalmic solution Place 1 drop into the left eye 2 (two) times daily.    . dorzolamide-timolol (COSOPT) 22.3-6.8 MG/ML ophthalmic solution Place 1 drop into both eyes 2 (two) times daily.     . hydrochlorothiazide (MICROZIDE) 12.5 MG capsule Take 12.5 mg by mouth daily as needed. For excess water.    . latanoprost (XALATAN) 0.005 % ophthalmic solution Place 1 drop into both eyes at bedtime.    Marland Kitchen levothyroxine (SYNTHROID, LEVOTHROID) 88 MCG tablet  Take 88 mcg by mouth daily before breakfast.    . nitroGLYCERIN (NITROSTAT) 0.4 MG SL tablet Place 0.4 mg under the tongue every 5 (five) minutes as needed. For chest pain.    . promethazine (PHENERGAN) 25 MG tablet Take 25 mg by mouth every 6 (six) hours as needed for nausea (nausea).     No current facility-administered medications for this visit.    Allergies:    Allergies  Allergen Reactions  . Crestor [Rosuvastatin]     MYALGIAS  . Eggs Or Egg-Derived Products Itching, Nausea Only and Swelling  . Lemon Juice Itching, Nausea Only and Swelling  . Milk-Related Compounds Itching, Nausea Only and Swelling  . Shellfish Allergy Itching, Nausea Only and Swelling    Social History:  The patient  reports that she has never smoked. She has never used smokeless tobacco. She reports that she does not drink alcohol or use illicit drugs.   Family History:  The patient's family history includes  Cancer in her brother and mother; Heart disease in her father. There is no history of Anesthesia problems, Hypotension, Malignant hyperthermia, or Pseudochol deficiency.   ROS:  Please see the history of present illness.  No nausea, vomiting.  No fevers, chills.  No focal weakness.  No dysuria.    All other systems reviewed and negative.   PHYSICAL EXAM: VS:  BP 132/74 mmHg  Pulse 63  Ht 5\' 5"  (1.651 m)  Wt 216 lb (97.977 kg)  BMI 35.94 kg/m2 Well nourished, well developed, in no acute distress HEENT: normal Neck: no JVD, no carotid bruits Cardiac:  normal S1, S2; RRR;  Lungs:  clear to auscultation bilaterally, no wheezing, rhonchi or rales Abd: soft, nontender, no hepatomegaly Ext: no edema, 2+ bilateral PT pulses Skin: warm and dry Neuro:   no focal abnormalities noted Psych: flat affect  EKG:    NSR, NSST  ASSESSMENT AND PLAN:  1.Essential hypertension, benign  Continue Norvasc Tablet, 10 MG, 1 tablet, Orally, Once a day, 90, Refills 3 Continue Hydrochlorothiazide Tablet, 12.5 MG, 1 tabs, Orally, Once a day, 90, Refills 3 Notes: Check BP at home. If consistently over 150/90, would add ACE-I. Controlled today. She has not been checking at home recently, but prior to this it was in the 122/8 range.  2. Peripheral Vascular Disease / hyperlipidemia Continue livalo  Tablet, 2 MG, 1 tablet every evening, Orally, Once a day, 30 day(s), 30, Refills 11 Notes: Watch for claudication or non healing ulcers in left leg. Biphasic wavefroms on the left side by prior u/s. May be some restenosis in the left iliac. Palpable PT pulses.   No clear indication for angiography. Did not tolerate Lipitor,Crestor or Lovastatin or simvastatin. Was Taking livalo- now off of this. Last LDL 145 in 8/14.  Due for lipid recheck.  Will see if she can be enrolled in our lipid clinic.  3. Limb pain  Notes: Returned.  Does not sound vascular in origin.  May be joint or nerve related since it is worse with lying on  her left side.  She has to sleep on her right side.  Not related to exertion.  Plan for LE arterial Dopplers.   Signed, Mina Marble, MD, [facility] 02/14/2014 10:39 AM

## 2014-02-20 ENCOUNTER — Other Ambulatory Visit: Payer: Self-pay | Admitting: Family Medicine

## 2014-02-20 ENCOUNTER — Other Ambulatory Visit: Payer: Medicare Other

## 2014-02-20 DIAGNOSIS — R1032 Left lower quadrant pain: Secondary | ICD-10-CM

## 2014-02-21 ENCOUNTER — Ambulatory Visit
Admission: RE | Admit: 2014-02-21 | Discharge: 2014-02-21 | Disposition: A | Discharge status: Home / Self Care | Payer: Medicare Other | Source: Ambulatory Visit | Attending: Family Medicine | Admitting: Family Medicine

## 2014-02-21 DIAGNOSIS — R1032 Left lower quadrant pain: Secondary | ICD-10-CM

## 2014-02-21 MED ORDER — IOHEXOL 300 MG/ML  SOLN
125.0000 mL | Freq: Once | INTRAMUSCULAR | Status: AC | PRN
  Administered 2014-02-21: 125 mL via INTRAVENOUS

## 2014-02-26 ENCOUNTER — Ambulatory Visit (HOSPITAL_COMMUNITY): Payer: Medicare Other

## 2014-02-26 ENCOUNTER — Other Ambulatory Visit (HOSPITAL_COMMUNITY): Payer: Self-pay | Admitting: Cardiology

## 2014-02-26 ENCOUNTER — Ambulatory Visit (HOSPITAL_COMMUNITY): Payer: Medicare Other | Attending: Cardiovascular Disease | Admitting: Cardiology

## 2014-02-26 DIAGNOSIS — I739 Peripheral vascular disease, unspecified: Secondary | ICD-10-CM | POA: Diagnosis present

## 2014-02-26 DIAGNOSIS — M79606 Pain in leg, unspecified: Secondary | ICD-10-CM | POA: Diagnosis not present

## 2014-02-26 DIAGNOSIS — I700 Atherosclerosis of aorta: Secondary | ICD-10-CM

## 2014-02-26 NOTE — Progress Notes (Signed)
ABI performed  

## 2014-03-14 ENCOUNTER — Other Ambulatory Visit (INDEPENDENT_AMBULATORY_CARE_PROVIDER_SITE_OTHER): Payer: Medicare Other | Admitting: *Deleted

## 2014-03-14 DIAGNOSIS — I10 Essential (primary) hypertension: Secondary | ICD-10-CM

## 2014-03-14 DIAGNOSIS — E782 Mixed hyperlipidemia: Secondary | ICD-10-CM

## 2014-03-14 LAB — LIPID PANEL
Cholesterol: 239 mg/dL — ABNORMAL HIGH (ref 0–200)
HDL: 58.8 mg/dL (ref >39.00)
LDL Cholesterol: 166 mg/dL — ABNORMAL HIGH (ref 0–99)
NonHDL: 180.2
Total CHOL/HDL Ratio: 4
Triglycerides: 70 mg/dL (ref 0.0–149.0)
VLDL: 14 mg/dL (ref 0.0–40.0)

## 2014-03-14 LAB — HEPATIC FUNCTION PANEL
ALT: 18 U/L (ref 0–35)
AST: 24 U/L (ref 0–37)
Albumin: 3.9 g/dL (ref 3.5–5.2)
Alkaline Phosphatase: 61 U/L (ref 39–117)
Bilirubin, Direct: 0.1 mg/dL (ref 0.0–0.3)
Total Bilirubin: 0.3 mg/dL (ref 0.2–1.2)
Total Protein: 7.3 g/dL (ref 6.0–8.3)

## 2014-03-26 ENCOUNTER — Ambulatory Visit: Payer: Medicare Other | Admitting: Pharmacist

## 2014-03-29 ENCOUNTER — Ambulatory Visit (INDEPENDENT_AMBULATORY_CARE_PROVIDER_SITE_OTHER): Payer: Medicare Other | Admitting: Pharmacist

## 2014-03-29 DIAGNOSIS — E782 Mixed hyperlipidemia: Secondary | ICD-10-CM | POA: Diagnosis not present

## 2014-03-29 MED ORDER — EZETIMIBE 10 MG PO TABS: 10.0000 mg | ORAL_TABLET | Freq: Every day | ORAL | Status: DC

## 2014-03-29 NOTE — Assessment & Plan Note (Signed)
Patient with uncontrolled hyperlipidemia due to inability to tolerate statin therapy and current LDL of 166 on no therapy. Previous intolerances to Lipitor, Crestor, Livalo, lovastatin, and simvastatin. Patient has never tried Zetia in the past -- sent new rx for Zetia 10mg  daily to patient's pharmacy. Will expect to see ~20% reduction in LDL. Advised that if she cannot tolerate the 10mg  dose that she could cut the tablets in half. Since patient is limited in her ability to stay physically active, focused more today on diet modifications. Her son is cooking most of her food, and we discussed incorporating more fruits, vegetables, and whole grains into her diet while trying to limit her intake of bacon, hotdogs, and hamburgers. Will follow up with lipid panel and pharmacy clinic visit in 6 weeks.

## 2014-03-29 NOTE — Patient Instructions (Addendum)
It was nice to see you in clinic today  - Please pick up your new prescription Zetia 10 mg once daily from the pharmacy - If you have muscle aches with the 10 mg dose, you can cut the pills in half and take a half tablet daily - Call us in lipid clinic at [phone] if you find that you cannot tolerate this dose either - Try to limit red meats in your diet and focus on eating more fruits, vegetables, and whole grains - We will see you again in 6 weeks:      - Come in on Tuesday May 3 for blood work (we will check your cholesterol)     - We will see you in pharmacy lipid clinic on Thursday May 5 at 11:30 to talk about your cholesterol again

## 2014-03-29 NOTE — Progress Notes (Signed)
Yvonne Tran is a 80 yo female referred to lipid clinic by Dr. Irish Lack. PMH is significant for hyperlipidemia and PAD s/p left iliac stent placement. She has continued to have leg pain and hip pain with walking. Fatigue also limits daily activity.  Current lipid therapy: none  Previous intolerances: patient reports intolerances to Lipitor, Crestor, lovastatin, and simvastatin (myalgias with all). Did not remember doses or duration of her previous statin use. [facility] pharmacy could only search back to the beginning of 2014 and reported that patient took simvastatin 10mg  daily in 2014. Patient took Livalo 2mg  daily up until mid 2015 when she reported dizziness and not feeling well overall.   Diet: Her son has been helping her cook recently. Typical diet as follows: Breakfast - coffee, toast, slice of bacon, or oatmeal Lunch - salad, hotdogs or hamburgers Dinner - similar to lunch Snacks - dark chocolate Drinks - soda (not sure if diet or regular)  Exercise: physical activity is limited due to patient's PAD and leg/hip pain when walking. She states that she has also had some numbness in her legs as well and that she has not been getting around as much as she used to. Her son has been helping with her shopping recently.  Recent lipid panel: 03/14/14 (no therapy) TC    239 TG    70 HDL  59 LDL  166  SH: no alcohol, cigarette, or drug use Significant FH: heart disease in her father  Current Outpatient Prescriptions on File Prior to Visit  Medication Sig Dispense Refill  . amLODipine (NORVASC) 5 MG tablet Take 10 mg by mouth every morning.     Marland Kitchen aspirin EC 81 MG tablet Take 81 mg by mouth every morning.     . brimonidine (ALPHAGAN) 0.15 % ophthalmic solution Place 1 drop into the left eye 2 (two) times daily.    . dorzolamide-timolol (COSOPT) 22.3-6.8 MG/ML ophthalmic solution Place 1 drop into both eyes 2 (two) times daily.     . hydrochlorothiazide (MICROZIDE) 12.5 MG capsule Take  12.5 mg by mouth daily as needed. For excess water.    . latanoprost (XALATAN) 0.005 % ophthalmic solution Place 1 drop into both eyes at bedtime.    Marland Kitchen levothyroxine (SYNTHROID, LEVOTHROID) 88 MCG tablet Take 88 mcg by mouth daily before breakfast.    . nitroGLYCERIN (NITROSTAT) 0.4 MG SL tablet Place 0.4 mg under the tongue every 5 (five) minutes as needed. For chest pain.    . promethazine (PHENERGAN) 25 MG tablet Take 25 mg by mouth every 6 (six) hours as needed for nausea (nausea).     No current facility-administered medications on file prior to visit.   Allergies  Allergen Reactions  . Crestor [Rosuvastatin]     MYALGIAS  . Eggs Or Egg-Derived Products Itching, Nausea Only and Swelling  . Lemon Juice Itching, Nausea Only and Swelling  . Milk-Related Compounds Itching, Nausea Only and Swelling  . Shellfish Allergy Itching, Nausea Only and Swelling

## 2014-05-08 ENCOUNTER — Other Ambulatory Visit: Payer: Medicare Other

## 2014-05-10 ENCOUNTER — Ambulatory Visit: Payer: Medicare Other | Admitting: Pharmacist

## 2014-05-15 ENCOUNTER — Other Ambulatory Visit (INDEPENDENT_AMBULATORY_CARE_PROVIDER_SITE_OTHER): Payer: Medicare Other | Admitting: *Deleted

## 2014-05-15 DIAGNOSIS — E782 Mixed hyperlipidemia: Secondary | ICD-10-CM | POA: Diagnosis not present

## 2014-05-15 LAB — LIPID PANEL
Cholesterol: 198 mg/dL (ref 0–200)
HDL: 47 mg/dL (ref >39.00)
LDL Cholesterol: 137 mg/dL — ABNORMAL HIGH (ref 0–99)
NonHDL: 151
Total CHOL/HDL Ratio: 4
Triglycerides: 68 mg/dL (ref 0.0–149.0)
VLDL: 13.6 mg/dL (ref 0.0–40.0)

## 2014-05-17 ENCOUNTER — Ambulatory Visit (INDEPENDENT_AMBULATORY_CARE_PROVIDER_SITE_OTHER): Payer: Medicare Other | Admitting: Pharmacist

## 2014-05-17 DIAGNOSIS — E782 Mixed hyperlipidemia: Secondary | ICD-10-CM | POA: Diagnosis not present

## 2014-05-17 NOTE — Progress Notes (Signed)
Yvonne Tran is a 80 yo female referred to lipid clinic by Dr. Irish Lack. PMH is significant for hyperlipidemia and PAD s/p left iliac stent placement. She has continued to have leg pain and hip pain with walking. Fatigue also limits daily activity.  At her last visit, she was placed on Zetia.  She reports an increase in fatigue when she takes it.  She will cut the tablet in half some days and states this makes her feel a little better.   Current lipid therapy: Zetia 10 mg 1/2 to 1 tablet daily   Previous intolerances: patient reports intolerances to Lipitor, Crestor, lovastatin, and simvastatin (myalgias with all). Did not remember doses or duration of her previous statin use. [facility] pharmacy could only search back to the beginning of 2014 and reported that patient took simvastatin 10mg  daily in 2014. Patient took Livalo 2mg  daily up until mid 2015 when she reported dizziness and not feeling well overall.  Diet: Her son has been helping her cook recently. Typical diet as follows: Breakfast - coffee, toast, slice of bacon, or oatmeal Lunch - salad, hotdogs or hamburgers Dinner - similar to lunch Snacks - dark chocolate Drinks - soda (not sure if diet or regular)  Exercise: physical activity is limited due to patient's PAD and leg/hip pain when walking.   Recent lipid panel 05/15/14 ( on Zetia 10 mg 1/2 to 1 tablet daily)  TC 198  TG  68 HDL 47 LDL 137   03/14/14 (no therapy) TC    239 TG    70 HDL  59 LDL  166  SH: no alcohol, cigarette, or drug use Significant FH: heart disease in her father  Current Outpatient Prescriptions on File Prior to Visit  Medication Sig Dispense Refill  . amLODipine (NORVASC) 5 MG tablet Take 10 mg by mouth every morning.     Marland Kitchen aspirin EC 81 MG tablet Take 81 mg by mouth every morning.     . brimonidine (ALPHAGAN) 0.15 % ophthalmic solution Place 1 drop into the left eye 2 (two) times daily.    . dorzolamide-timolol (COSOPT) 22.3-6.8 MG/ML  ophthalmic solution Place 1 drop into both eyes 2 (two) times daily.     Marland Kitchen ezetimibe (ZETIA) 10 MG tablet Take 1 tablet (10 mg total) by mouth daily. 90 tablet 3  . hydrochlorothiazide (MICROZIDE) 12.5 MG capsule Take 12.5 mg by mouth daily as needed. For excess water.    . latanoprost (XALATAN) 0.005 % ophthalmic solution Place 1 drop into both eyes at bedtime.    Marland Kitchen levothyroxine (SYNTHROID, LEVOTHROID) 88 MCG tablet Take 88 mcg by mouth daily before breakfast.    . nitroGLYCERIN (NITROSTAT) 0.4 MG SL tablet Place 0.4 mg under the tongue every 5 (five) minutes as needed. For chest pain.    . promethazine (PHENERGAN) 25 MG tablet Take 25 mg by mouth every 6 (six) hours as needed for nausea (nausea).     No current facility-administered medications on file prior to visit.   Allergies  Allergen Reactions  . Crestor [Rosuvastatin]     MYALGIAS  . Eggs Or Egg-Derived Products Itching, Nausea Only and Swelling  . Lemon Juice Itching, Nausea Only and Swelling  . Milk-Related Compounds Itching, Nausea Only and Swelling  . Shellfish Allergy Itching, Nausea Only and Swelling    Assessment/Plan:   1.  Hyperlipidemia: Pt responded well to Zetia. Her LDL decreased from 166 to 137, but still not at goal of <70.  Not sure that the weakness is  related to Zetia.  She has an appt with her PCP to follow up soon.  Discussed PCSK-9 with patient as a possible alternative to further lower her LDL, but cost was an issue.  Will continue Zetia and allow pt to decrease to 1/2 tablet daily since this seems to help with fatigue and should not change efficacy.  Will follow up with PCP in a few months then Dr. Radford Pax next February.

## 2014-05-17 NOTE — Patient Instructions (Signed)
It is okay to take just 1/2 of your Zetia tablet each day.    If you want to talk more about the new medications (Praluent or Repatha), please call Gay Filler at [phone].  Recheck your labs with Dr. Leonides Schanz at your yearly visit and then we will check them again when you see Dr. Irish Lack in February.

## 2015-01-20 ENCOUNTER — Encounter (HOSPITAL_COMMUNITY): Payer: Self-pay

## 2015-01-20 ENCOUNTER — Emergency Department (HOSPITAL_COMMUNITY): Payer: PPO

## 2015-01-20 ENCOUNTER — Emergency Department (HOSPITAL_COMMUNITY)
Admission: EM | Admit: 2015-01-20 | Discharge: 2015-01-20 | Disposition: A | Discharge status: Home / Self Care | Payer: PPO | Attending: Emergency Medicine | Admitting: Emergency Medicine

## 2015-01-20 DIAGNOSIS — J209 Acute bronchitis, unspecified: Secondary | ICD-10-CM | POA: Diagnosis not present

## 2015-01-20 DIAGNOSIS — Z86018 Personal history of other benign neoplasm: Secondary | ICD-10-CM | POA: Diagnosis not present

## 2015-01-20 DIAGNOSIS — Z7952 Long term (current) use of systemic steroids: Secondary | ICD-10-CM | POA: Insufficient documentation

## 2015-01-20 DIAGNOSIS — Z7982 Long term (current) use of aspirin: Secondary | ICD-10-CM | POA: Insufficient documentation

## 2015-01-20 DIAGNOSIS — R0602 Shortness of breath: Secondary | ICD-10-CM | POA: Diagnosis not present

## 2015-01-20 DIAGNOSIS — Z8639 Personal history of other endocrine, nutritional and metabolic disease: Secondary | ICD-10-CM | POA: Diagnosis not present

## 2015-01-20 DIAGNOSIS — Z79899 Other long term (current) drug therapy: Secondary | ICD-10-CM | POA: Insufficient documentation

## 2015-01-20 DIAGNOSIS — Z8673 Personal history of transient ischemic attack (TIA), and cerebral infarction without residual deficits: Secondary | ICD-10-CM | POA: Insufficient documentation

## 2015-01-20 DIAGNOSIS — H409 Unspecified glaucoma: Secondary | ICD-10-CM | POA: Insufficient documentation

## 2015-01-20 DIAGNOSIS — I10 Essential (primary) hypertension: Secondary | ICD-10-CM | POA: Insufficient documentation

## 2015-01-20 DIAGNOSIS — R062 Wheezing: Secondary | ICD-10-CM | POA: Diagnosis not present

## 2015-01-20 DIAGNOSIS — J9801 Acute bronchospasm: Secondary | ICD-10-CM

## 2015-01-20 DIAGNOSIS — R05 Cough: Secondary | ICD-10-CM | POA: Diagnosis not present

## 2015-01-20 DIAGNOSIS — J40 Bronchitis, not specified as acute or chronic: Secondary | ICD-10-CM

## 2015-01-20 DIAGNOSIS — I209 Angina pectoris, unspecified: Secondary | ICD-10-CM | POA: Diagnosis not present

## 2015-01-20 DIAGNOSIS — Z9889 Other specified postprocedural states: Secondary | ICD-10-CM | POA: Insufficient documentation

## 2015-01-20 DIAGNOSIS — E079 Disorder of thyroid, unspecified: Secondary | ICD-10-CM | POA: Insufficient documentation

## 2015-01-20 LAB — BASIC METABOLIC PANEL
Anion gap: 9 (ref 5–15)
BUN: 7 mg/dL (ref 6–20)
CO2: 27 mmol/L (ref 22–32)
Calcium: 8.8 mg/dL — ABNORMAL LOW (ref 8.9–10.3)
Chloride: 106 mmol/L (ref 101–111)
Creatinine, Ser: 0.87 mg/dL (ref 0.44–1.00)
GFR calc Af Amer: >60 mL/min (ref >60)
GFR calc non Af Amer: >60 mL/min (ref >60)
Glucose, Bld: 124 mg/dL — ABNORMAL HIGH (ref 65–99)
Potassium: 3.6 mmol/L (ref 3.5–5.1)
Sodium: 142 mmol/L (ref 135–145)

## 2015-01-20 LAB — HEPATIC FUNCTION PANEL
ALT: 12 U/L — ABNORMAL LOW (ref 14–54)
AST: 21 U/L (ref 15–41)
Albumin: 3.4 g/dL — ABNORMAL LOW (ref 3.5–5.0)
Alkaline Phosphatase: 74 U/L (ref 38–126)
Bilirubin, Direct: <0.1 mg/dL — ABNORMAL LOW (ref 0.1–0.5)
Total Bilirubin: 0.6 mg/dL (ref 0.3–1.2)
Total Protein: 6.8 g/dL (ref 6.5–8.1)

## 2015-01-20 LAB — I-STAT TROPONIN, ED: Troponin i, poc: 0 ng/mL (ref 0.00–0.08)

## 2015-01-20 LAB — CBC
HCT: 38.9 % (ref 36.0–46.0)
Hemoglobin: 12.8 g/dL (ref 12.0–15.0)
MCH: 28.8 pg (ref 26.0–34.0)
MCHC: 32.9 g/dL (ref 30.0–36.0)
MCV: 87.4 fL (ref 78.0–100.0)
Platelets: 242 10*3/uL (ref 150–400)
RBC: 4.45 MIL/uL (ref 3.87–5.11)
RDW: 16 % — ABNORMAL HIGH (ref 11.5–15.5)
WBC: 5.4 10*3/uL (ref 4.0–10.5)

## 2015-01-20 MED ORDER — PREDNISONE 10 MG PO TABS: 20.0000 mg | ORAL_TABLET | Freq: Every day | ORAL | Status: DC

## 2015-01-20 MED ORDER — AZITHROMYCIN 250 MG PO TABS: ORAL_TABLET | ORAL | Status: DC

## 2015-01-20 MED ORDER — ALBUTEROL SULFATE (2.5 MG/3ML) 0.083% IN NEBU
2.5000 mg | INHALATION_SOLUTION | Freq: Once | RESPIRATORY_TRACT | Status: AC
  Administered 2015-01-20: 2.5 mg via RESPIRATORY_TRACT
  Filled 2015-01-20: qty 3

## 2015-01-20 MED ORDER — IPRATROPIUM-ALBUTEROL 0.5-2.5 (3) MG/3ML IN SOLN
3.0000 mL | Freq: Once | RESPIRATORY_TRACT | Status: AC
  Administered 2015-01-20: 3 mL via RESPIRATORY_TRACT
  Filled 2015-01-20: qty 3

## 2015-01-20 MED ORDER — PREDNISONE 20 MG PO TABS
60.0000 mg | ORAL_TABLET | Freq: Once | ORAL | Status: AC
  Administered 2015-01-20: 60 mg via ORAL
  Filled 2015-01-20: qty 3

## 2015-01-20 NOTE — ED Notes (Signed)
Droplet sign placed on door

## 2015-01-20 NOTE — ED Provider Notes (Signed)
CSN: [id number]     Arrival date & time 01/20/15  [id number] History   First MD Initiated Contact with Patient 01/20/15 [phone]     Chief Complaint  Patient presents with  . URI  . Chest Pain  . Cough     (Consider location/radiation/quality/duration/timing/severity/associated sxs/prior Treatment) Patient is a 81 y.o. female presenting with URI, chest pain, and cough. The history is provided by the patient (The patient complains of a cough for over a week with yellow-brown sputum production).  URI Presenting symptoms: congestion and cough   Presenting symptoms: no fatigue   Severity:  Moderate Timing:  Constant Progression:  Worsening Chronicity:  New Relieved by:  Nothing Associated symptoms: wheezing   Associated symptoms: no headaches   Chest Pain Associated symptoms: cough   Associated symptoms: no abdominal pain, no back pain, no fatigue and no headache   Cough Associated symptoms: chest pain and wheezing   Associated symptoms: no eye discharge, no headaches and no rash     Past Medical History  Diagnosis Date  . Hyperlipidemia   . Hypertension   . Lipoma   . Cardiac angina ([place])   . Heart attack ([place])     30 YRS AGO  DR VARANASI  EAGLE CARD  . Stroke [facility])     MINI STROKE  YRS AGO   . Shortness of breath     WITH EXERTION   . Recurrent upper respiratory infection (URI) MAY 2013    RECENT COLD   . Goiter     THROID GOITER WITH COMPRESSION--PT HAVING TROUBLE SWALLOWING AND SOB  . Headache(784.0)     LEFT TEMPORAL AREA  . Pain     RIGHT THIGH FOR QUITE SOME TIME--LEG GIVES AWAY SOMETIMES.  LOWER BACK PAIN  - S/P HX OF PREVIOUS BACK SURGERY  . Glaucoma     GLAUCOMA IN   BOTH EYES:  LEGALLY BLIND IN RIGHT EYE  . Peripheral vascular disease ([place])     09/2008 STENT LEFT EXTERNAL ILIAC;  03/20/10 RE-STENT OF LEFT EXTERNAL ILIAC STENT- AT [facility] WITH DR. VARANASI   Past Surgical History  Procedure Laterality Date  . Cardiac catheterization  2012  . Eye surgery      left  eye, lens replacement  . Vascular surgery      STENT PLACED 03/2010 RT LEG  . Mass excision  01/13/2011    Procedure: EXCISION MASS;  Surgeon: Stark Klein, MD;  Location: [place] [facility];  Service: General;  Laterality: Left;  Left abdominal wall mass (subcutaneous, 4-5 cm).  . Back surgery  [facility] PT HAS PARALYSIS LEFT ARM AND LEG--TOOK 2&1/2 YEARS TO REGAIN USE OF LEFT SIDE--LEFT SIDE REMAINS WEAKER THAN HER RIFHT SIDE.  Marland Kitchen Appendectomy    . Thyroidectomy  08/14/2011    Procedure: THYROIDECTOMY;  Surgeon: Earnstine Regal, MD;  Location: [facility];  Service: General;  Laterality: N/A;  Total Thyroidectomy   Family History  Problem Relation Age of Onset  . Cancer Mother     brain  . Cancer Brother     prostate  . Anesthesia problems Neg Hx   . Hypotension Neg Hx   . Malignant hyperthermia Neg Hx   . Pseudochol deficiency Neg Hx   . Heart disease Father    Social History  Substance Use Topics  . Smoking status: Never Smoker   . Smokeless tobacco: Never Used  . Alcohol Use: No   OB History  No data available     Review of Systems  Constitutional: Negative for appetite change and fatigue.  HENT: Positive for congestion. Negative for ear discharge and sinus pressure.   Eyes: Negative for discharge.  Respiratory: Positive for cough and wheezing.   Cardiovascular: Positive for chest pain.  Gastrointestinal: Negative for abdominal pain and diarrhea.  Genitourinary: Negative for frequency and hematuria.  Musculoskeletal: Negative for back pain.  Skin: Negative for rash.  Neurological: Negative for seizures and headaches.  Psychiatric/Behavioral: Negative for hallucinations.      Allergies  Crestor; Eggs or egg-derived products; Lemon juice; Milk-related compounds; and Shellfish allergy  Home Medications   Prior to Admission medications   Medication Sig Start Date End Date Taking? Authorizing Provider  amLODipine (NORVASC) 10 MG tablet Take 10 mg by  mouth daily.   Yes Historical Provider, MD  aspirin EC 81 MG tablet Take 81 mg by mouth every morning.    Yes Historical Provider, MD  brimonidine (ALPHAGAN) 0.15 % ophthalmic solution Place 1 drop into the left eye 2 (two) times daily.   Yes Historical Provider, MD  dorzolamide-timolol (COSOPT) 22.3-6.8 MG/ML ophthalmic solution Place 1 drop into both eyes 2 (two) times daily.    Yes Historical Provider, MD  hydrochlorothiazide (MICROZIDE) 12.5 MG capsule Take 12.5 mg by mouth daily as needed. For excess water.   Yes Historical Provider, MD  latanoprost (XALATAN) 0.005 % ophthalmic solution Place 1 drop into both eyes at bedtime.   Yes Historical Provider, MD  levothyroxine (SYNTHROID, LEVOTHROID) 88 MCG tablet Take 88 mcg by mouth daily before breakfast.   Yes Historical Provider, MD  nitroGLYCERIN (NITROSTAT) 0.4 MG SL tablet Place 0.4 mg under the tongue every 5 (five) minutes as needed. For chest pain.   Yes Historical Provider, MD  prednisoLONE acetate (PRED FORTE) 1 % ophthalmic suspension Place 1 drop into both eyes 4 (four) times daily.   Yes Historical Provider, MD  azithromycin (ZITHROMAX Z-PAK) 250 MG tablet 2 po day one, then 1 daily x 4 days 01/20/15   Milton Ferguson, MD  predniSONE (DELTASONE) 10 MG tablet Take 2 tablets (20 mg total) by mouth daily. 01/20/15   Milton Ferguson, MD   BP 186/80 mmHg  Pulse 73  Temp(Src) 98.2 F (36.8 C) (Oral)  Resp 24  SpO2 99% Physical Exam  Constitutional: She is oriented to person, place, and time. She appears well-developed.  HENT:  Head: Normocephalic.  Eyes: Conjunctivae and EOM are normal. No scleral icterus.  Neck: Neck supple. No thyromegaly present.  Cardiovascular: Normal rate and regular rhythm.  Exam reveals no gallop and no friction rub.   No murmur heard. Pulmonary/Chest: No stridor. She has wheezes. She has no rales. She exhibits no tenderness.  Abdominal: She exhibits no distension. There is no tenderness. There is no rebound.   Musculoskeletal: Normal range of motion. She exhibits no edema.  Lymphadenopathy:    She has no cervical adenopathy.  Neurological: She is oriented to person, place, and time. She exhibits normal muscle tone. Coordination normal.  Skin: No rash noted. No erythema.  Psychiatric: She has a normal mood and affect. Her behavior is normal.    ED Course  Procedures (including critical care time) Labs Review Labs Reviewed  BASIC METABOLIC PANEL - Abnormal; Notable for the following:    Glucose, Bld 124 (*)    Calcium 8.8 (*)    All other components within normal limits  CBC - Abnormal; Notable for the following:    RDW  16.0 (*)    All other components within normal limits  HEPATIC FUNCTION PANEL - Abnormal; Notable for the following:    Albumin 3.4 (*)    ALT 12 (*)    Bilirubin, Direct <0.1 (*)    All other components within normal limits  Randolm Idol, ED    Imaging Review Dg Chest 2 View  01/20/2015  CLINICAL DATA:  Short of breath EXAM: CHEST  2 VIEW COMPARISON:  09/19/2012 FINDINGS: Normal heart size. Lungs are under aerated with bibasilar atelectasis, most noticeable on the lateral view. Focal eventration of the anterior right hemidiaphragm persists. No pneumothorax. Tiny pleural effusions may be present. IMPRESSION: Bibasilar atelectasis. Tiny pleural effusions may be present. Electronically Signed   By: Marybelle Killings M.D.   On: 01/20/2015 08:00   I have personally reviewed and evaluated these images and lab results as part of my medical decision-making.   EKG Interpretation   Date/Time:  Sunday January 20 2015 06:54:28 EST Ventricular Rate:  74 PR Interval:  138 QRS Duration: 88 QT Interval:  352 QTC Calculation: 390 R Axis:   22 Text Interpretation:  Normal sinus rhythm Cannot rule out Anterior infarct  , age undetermined Abnormal ECG Confirmed by Harol Shabazz  MD, Charman Blasco [phone]) on  01/20/2015 9:23:44 AM      MDM   Final diagnoses:  Bronchitis  Bronchospasm     Patient improved with neb treatments. Suspect bronchitis with bronchospasm will treat with Z-Pak and prednisone patient will follow-up with PCP this week    Milton Ferguson, MD 01/20/15 1055

## 2015-01-20 NOTE — Discharge Instructions (Signed)
Follow-up with her family doctor later this week for recheck

## 2015-01-20 NOTE — ED Notes (Signed)
Patient in Xray

## 2015-01-20 NOTE — ED Notes (Signed)
Pt here for chest pain, cough,congestion, and difficulty breathing, sts onset a few days ago and getting worse.

## 2015-02-11 DIAGNOSIS — J45909 Unspecified asthma, uncomplicated: Secondary | ICD-10-CM | POA: Diagnosis not present

## 2015-02-11 DIAGNOSIS — H548 Legal blindness, as defined in USA: Secondary | ICD-10-CM | POA: Diagnosis not present

## 2015-02-11 DIAGNOSIS — Z9181 History of falling: Secondary | ICD-10-CM | POA: Diagnosis not present

## 2015-02-20 ENCOUNTER — Other Ambulatory Visit: Payer: Self-pay | Admitting: Interventional Cardiology

## 2015-02-20 DIAGNOSIS — I700 Atherosclerosis of aorta: Secondary | ICD-10-CM

## 2015-02-20 DIAGNOSIS — I739 Peripheral vascular disease, unspecified: Secondary | ICD-10-CM

## 2015-02-25 DIAGNOSIS — Z961 Presence of intraocular lens: Secondary | ICD-10-CM | POA: Diagnosis not present

## 2015-02-25 DIAGNOSIS — H401133 Primary open-angle glaucoma, bilateral, severe stage: Secondary | ICD-10-CM | POA: Diagnosis not present

## 2015-02-27 ENCOUNTER — Ambulatory Visit (HOSPITAL_COMMUNITY)
Admission: RE | Admit: 2015-02-27 | Discharge: 2015-02-27 | Disposition: A | Discharge status: Home / Self Care | Payer: PPO | Source: Ambulatory Visit | Attending: Interventional Cardiology | Admitting: Interventional Cardiology

## 2015-02-27 ENCOUNTER — Ambulatory Visit (HOSPITAL_COMMUNITY)
Admission: RE | Admit: 2015-02-27 | Discharge: 2015-02-27 | Disposition: A | Discharge status: Home / Self Care | Payer: PPO | Source: Ambulatory Visit | Attending: Cardiovascular Disease | Admitting: Cardiovascular Disease

## 2015-02-27 DIAGNOSIS — E785 Hyperlipidemia, unspecified: Secondary | ICD-10-CM | POA: Insufficient documentation

## 2015-02-27 DIAGNOSIS — I10 Essential (primary) hypertension: Secondary | ICD-10-CM | POA: Insufficient documentation

## 2015-02-27 DIAGNOSIS — I700 Atherosclerosis of aorta: Secondary | ICD-10-CM | POA: Diagnosis not present

## 2015-02-27 DIAGNOSIS — I708 Atherosclerosis of other arteries: Secondary | ICD-10-CM | POA: Diagnosis not present

## 2015-02-27 DIAGNOSIS — I739 Peripheral vascular disease, unspecified: Secondary | ICD-10-CM

## 2015-02-27 DIAGNOSIS — Z9889 Other specified postprocedural states: Secondary | ICD-10-CM | POA: Diagnosis not present

## 2015-03-04 ENCOUNTER — Telehealth: Payer: Self-pay | Admitting: Interventional Cardiology

## 2015-03-04 NOTE — Telephone Encounter (Signed)
**Note De-Identified Yvonne Tran Obfuscation** Sonya from [facility] states that she is calling the pt now to arrange date and time of appt with Dr Fletcher Anon.

## 2015-03-04 NOTE — Telephone Encounter (Signed)
Mrs. Yvonne Tran is calling to find out if she is to have an appointment with Dr. Fletcher Anon .   Thanks

## 2015-03-18 ENCOUNTER — Ambulatory Visit (INDEPENDENT_AMBULATORY_CARE_PROVIDER_SITE_OTHER): Payer: PPO | Admitting: Interventional Cardiology

## 2015-03-18 ENCOUNTER — Encounter: Payer: Self-pay | Admitting: Interventional Cardiology

## 2015-03-18 VITALS — BP 138/80 | HR 62 | Ht 65.0 in | Wt 219.8 lb

## 2015-03-18 DIAGNOSIS — E782 Mixed hyperlipidemia: Secondary | ICD-10-CM

## 2015-03-18 DIAGNOSIS — I10 Essential (primary) hypertension: Secondary | ICD-10-CM

## 2015-03-18 DIAGNOSIS — I739 Peripheral vascular disease, unspecified: Secondary | ICD-10-CM | POA: Diagnosis not present

## 2015-03-18 DIAGNOSIS — R0781 Pleurodynia: Secondary | ICD-10-CM | POA: Diagnosis not present

## 2015-03-18 NOTE — Patient Instructions (Signed)
**Note De-identified Yvonne Tran Obfuscation** Medication Instructions:  Same-no changes  Labwork: None  Testing/Procedures: None  Follow-Up: Your physician wants you to follow-up in: 1 year. You will receive a reminder letter in the mail two months in advance. If you don't receive a letter, please call our office to schedule the follow-up appointment.      If you need a refill on your cardiac medications before your next appointment, please call your pharmacy.   

## 2015-03-18 NOTE — Progress Notes (Signed)
Patient ID: Yvonne Tran, female   DOB: 25-Jun-1934, 81 y.o.   MRN: [id number]     Cardiology Office Note   Date:  03/18/2015   ID:  Yvonne Tran, DOB 1934/04/03, MRN [id number]  PCP:  Cari Caraway, MD    No chief complaint on file.    Wt Readings from Last 3 Encounters:  03/18/15 219 lb 12.8 oz (99.701 kg)  02/14/14 216 lb (97.977 kg)  02/10/13 222 lb 12.8 oz (101.061 kg)       History of Present Illness: Yvonne Tran is a 81 y.o. female  who has had PAD. She has had left iliac stents placed in the past.  Her walking is limited by balance problems.  THis has progressed since the last visit.  It now occurs frequently.  She is being evaluated for a rolling walker.  No calf cramping with walking.  No nonhealing sores on her feet. She fell twice in January.  She says that it happens so quickly that she does not know the circumstances of the falling.  She was in the ER in Jan or a flu like illness.  She was not hospitalized.   BP has been well controlled at home.  She has had some occasional chest pains.  It is worse with coughing.  SHe had a lot of phlegm production with the respiratory event in January.  Most strenuous activity is housekeeping. She has fatigue and instability that limits her. Caths in 2009 and 2012 were clear of any significant CAD. She uses NTG rarely. It has reduced in frequency.    Past Medical History  Diagnosis Date  . Hyperlipidemia   . Hypertension   . Lipoma   . Cardiac angina ([place])   . Heart attack ([place])     30 YRS AGO  DR Tameya Kuznia  EAGLE CARD  . Stroke [facility])     MINI STROKE  YRS AGO   . Shortness of breath     WITH EXERTION   . Recurrent upper respiratory infection (URI) MAY 2013    RECENT COLD   . Goiter     THROID GOITER WITH COMPRESSION--PT HAVING TROUBLE SWALLOWING AND SOB  . Headache(784.0)     LEFT TEMPORAL AREA  . Pain     RIGHT THIGH FOR QUITE SOME TIME--LEG GIVES AWAY SOMETIMES.  LOWER BACK PAIN   - S/P HX OF PREVIOUS BACK SURGERY  . Glaucoma     GLAUCOMA IN   BOTH EYES:  LEGALLY BLIND IN RIGHT EYE  . Peripheral vascular disease ([place])     09/2008 STENT LEFT EXTERNAL ILIAC;  03/20/10 RE-STENT OF LEFT EXTERNAL ILIAC STENT- AT [facility] WITH DR. Nashon Erbes    Past Surgical History  Procedure Laterality Date  . Cardiac catheterization  2012  . Eye surgery      left eye, lens replacement  . Vascular surgery      STENT PLACED 03/2010 RT LEG  . Mass excision  01/13/2011    Procedure: EXCISION MASS;  Surgeon: Stark Klein, MD;  Location: [place];  Service: General;  Laterality: Left;  Left abdominal wall mass (subcutaneous, 4-5 cm).  . Back surgery  [facility] PT HAS PARALYSIS LEFT ARM AND LEG--TOOK 2&1/2 YEARS TO REGAIN USE OF LEFT SIDE--LEFT SIDE REMAINS WEAKER THAN HER RIFHT SIDE.  Marland Kitchen Appendectomy    . Thyroidectomy  08/14/2011    Procedure: THYROIDECTOMY;  Surgeon: Earnstine Regal, MD;  Location[facility];  Service: General;  Laterality: N/A;  Total Thyroidectomy     Current Outpatient Prescriptions  Medication Sig Dispense Refill  . amLODipine (NORVASC) 10 MG tablet Take 10 mg by mouth daily.    Marland Kitchen aspirin EC 81 MG tablet Take 81 mg by mouth every morning.     Marland Kitchen azithromycin (ZITHROMAX Z-PAK) 250 MG tablet 2 po day one, then 1 daily x 4 days 5 tablet 0  . brimonidine (ALPHAGAN) 0.15 % ophthalmic solution Place 1 drop into the left eye 2 (two) times daily.    . dorzolamide-timolol (COSOPT) 22.3-6.8 MG/ML ophthalmic solution Place 1 drop into both eyes 2 (two) times daily.     . hydrochlorothiazide (MICROZIDE) 12.5 MG capsule Take 12.5 mg by mouth daily as needed. For excess water.    . latanoprost (XALATAN) 0.005 % ophthalmic solution Place 1 drop into both eyes at bedtime.    Marland Kitchen levothyroxine (SYNTHROID, LEVOTHROID) 88 MCG tablet Take 88 mcg by mouth daily before breakfast.    . nitroGLYCERIN (NITROSTAT) 0.4 MG SL tablet Place 0.4 mg under the tongue every 5 (five)  minutes as needed. For chest pain.    . prednisoLONE acetate (PRED FORTE) 1 % ophthalmic suspension Place 1 drop into both eyes 4 (four) times daily.     No current facility-administered medications for this visit.    Allergies:   Crestor; Eggs or egg-derived products; Lemon juice; Milk-related compounds; and Shellfish allergy    Social History:  The patient  reports that she has never smoked. She has never used smokeless tobacco. She reports that she does not drink alcohol or use illicit drugs.   Family History:  The patient's family history includes Cancer in her brother and mother; Heart attack in her father; Heart disease in her father; Hypertension in her father; Stroke in her mother. There is no history of Anesthesia problems, Hypotension, Malignant hyperthermia, or Pseudochol deficiency.    ROS:  Please see the history of present illness.   Otherwise, review of systems are positive for leg pains, cough; leg cramps occasionally.   All other systems are reviewed and negative.    PHYSICAL EXAM: VS:  BP 138/80 mmHg  Pulse 62  Ht 5\' 5"  (1.651 m)  Wt 219 lb 12.8 oz (99.701 kg)  BMI 36.58 kg/m2 , BMI Body mass index is 36.58 kg/(m^2). GEN: Well nourished, well developed, in no acute distress HEENT: normal Neck: no JVD, carotid bruits, or masses Cardiac: RRR; no murmurs, rubs, or gallops,no edema ; 2+ DP pulses bilaterally Respiratory:  clear to auscultation bilaterally, normal work of breathing GI: soft, nontender, nondistended, + BS MS: no deformity or atrophy Skin: warm and dry, no rash Neuro:  Strength and sensation are intact Psych: euthymic mood, full affect   EKG:   The ekg ordered in Jan 2017 demonstrates NSR, no ST segment changes; troponin normal in Jan 2017.   Recent Labs: 01/20/2015: ALT 12*; BUN 7; Creatinine, Ser 0.87; Hemoglobin 12.8; Platelets 242; Potassium 3.6; Sodium 142   Lipid Panel    Component Value Date/Time   CHOL 198 05/15/2014 0947   TRIG 68.0  05/15/2014 0947   HDL 47.00 05/15/2014 0947   CHOLHDL 4 05/15/2014 0947   VLDL 13.6 05/15/2014 0947   LDLCALC 137* 05/15/2014 0947   LDLDIRECT 196.5 12/12/2012 1104     Other studies Reviewed: Additional studies/ records that were reviewed today with results demonstrating: prior cath in 2012 showed no significant CAD.   ASSESSMENT AND PLAN:  1. PAD: Prior  iliac stents. She had an abnormal lower extremity Doppler. She is scheduled to see Dr. Fletcher Anon. Her balance has gotten worse since I saw her last year.  Will see if any further management is planned. No lower extremity sores that will not heal. This does not appear to be limb threatening at this point. 2. Hyperlipidemia: She was taking one half to a full Zetia pill as of May 2016. She stopped taking this medicine for unclear reasons. She does not tolerate statins. Will refer back to lipid clinic to see whether other therapy would be indicated. PC SK 9 inhibitor was discussed but due to cost, this was not pursued. 3. Chest pain- pleuritic: Some atypical features. She does use nitroglycerin sometimes with some relief. However, this mostly occurs with coughing. I did counsel her that if she has persistent discomfort not relieved with nitroglycerin, she needs to seek attention in the ER. 4. Hypertension: Well controlled today. Continue current medications.   Current medicines are reviewed at length with the patient today.  The patient concerns regarding her medicines were addressed.  The following changes have been made:  No change  Labs/ tests ordered today include:  No orders of the defined types were placed in this encounter.    Recommend 150 minutes/week of aerobic exercise Low fat, low carb, high fiber diet recommended  Disposition:   FU in one year   Teresita Madura., MD  03/18/2015 9:34 AM    [facility] [place], [place], [place]  [address] Phone: [phone]; Fax: [phone]

## 2015-03-19 ENCOUNTER — Ambulatory Visit (INDEPENDENT_AMBULATORY_CARE_PROVIDER_SITE_OTHER): Payer: PPO | Admitting: Cardiovascular Disease

## 2015-03-19 VITALS — BP 132/70 | HR 71 | Ht 65.0 in | Wt 225.6 lb

## 2015-03-19 DIAGNOSIS — I739 Peripheral vascular disease, unspecified: Secondary | ICD-10-CM | POA: Diagnosis not present

## 2015-03-19 NOTE — Progress Notes (Signed)
Cardiology Office Note   Date:  03/19/2015   ID:  Yvonne Tran, DOB 1934/08/04, MRN [id number]  PCP:  Cari Caraway, MD  Cardiologist:  Dr. Irish Lack  Chief Complaint  Patient presents with  . Advice Only      History of Present Illness: Yvonne Tran is a 81 y.o. female who Was referred by Dr. Irish Lack for management of peripheral arterial disease. She has known history of peripheral arterial disease with previous left iliac stenting twice most recently in 2012 for in-stent restenosis. She has known history of recurrent chest pain with no evidence of significant coronary artery disease on previous cardiac catheterization in 2009 and 2012. She is not diabetic and does not smoke. She does have known history of hypertension. She reports having back surgery done in 1986 after a spine fracture. She had very slow recovery at that time and never regained her functional capacity that she had before the event. She continues to complain of lower back pain with stiffness. She has been using a cane for a long time. She is only to walk a short distance. She reports that even when she had her left iliac artery stenting done in the past, she did not notice significant symptomatic improvement. She continues to complain of bilateral hip and leg pain with walking but this can also happen at rest. There is no lower extremity ulceration.   Past Medical History  Diagnosis Date  . Hyperlipidemia   . Hypertension   . Lipoma   . Cardiac angina ([place])   . Heart attack ([place])     30 YRS AGO  DR VARANASI  EAGLE CARD  . Stroke [facility])     MINI STROKE  YRS AGO   . Shortness of breath     WITH EXERTION   . Recurrent upper respiratory infection (URI) MAY 2013    RECENT COLD   . Goiter     THROID GOITER WITH COMPRESSION--PT HAVING TROUBLE SWALLOWING AND SOB  . Headache(784.0)     LEFT TEMPORAL AREA  . Pain     RIGHT THIGH FOR QUITE SOME TIME--LEG GIVES AWAY SOMETIMES.  LOWER BACK PAIN   - S/P HX OF PREVIOUS BACK SURGERY  . Glaucoma     GLAUCOMA IN   BOTH EYES:  LEGALLY BLIND IN RIGHT EYE  . Peripheral vascular disease ([place])     09/2008 STENT LEFT EXTERNAL ILIAC;  03/20/10 RE-STENT OF LEFT EXTERNAL ILIAC STENT- AT [facility] WITH DR. VARANASI    Past Surgical History  Procedure Laterality Date  . Cardiac catheterization  2012  . Eye surgery      left eye, lens replacement  . Vascular surgery      STENT PLACED 03/2010 RT LEG  . Mass excision  01/13/2011    Procedure: EXCISION MASS;  Surgeon: Stark Klein, MD;  Location: [place];  Service: General;  Laterality: Left;  Left abdominal wall mass (subcutaneous, 4-5 cm).  . Back surgery  [place] PT HAS PARALYSIS LEFT ARM AND LEG--TOOK 2&1/2 YEARS TO REGAIN USE OF LEFT SIDE--LEFT SIDE REMAINS WEAKER THAN HER RIFHT SIDE.  Marland Kitchen Appendectomy    . Thyroidectomy  08/14/2011    Procedure: THYROIDECTOMY;  Surgeon: Earnstine Regal, MD;  [facility];  Service: General;  Laterality: N/A;  Total Thyroidectomy     Current Outpatient Prescriptions  Medication Sig Dispense Refill  . amLODipine (NORVASC) 10 MG tablet Take 10 mg by mouth daily.    Marland Kitchen  aspirin EC 81 MG tablet Take 81 mg by mouth every morning.     Marland Kitchen azithromycin (ZITHROMAX Z-PAK) 250 MG tablet 2 po day one, then 1 daily x 4 days 5 tablet 0  . brimonidine (ALPHAGAN) 0.15 % ophthalmic solution Place 1 drop into the left eye 2 (two) times daily.    . dorzolamide-timolol (COSOPT) 22.3-6.8 MG/ML ophthalmic solution Place 1 drop into both eyes 2 (two) times daily.     . hydrochlorothiazide (MICROZIDE) 12.5 MG capsule Take 12.5 mg by mouth daily as needed. For excess water.    . latanoprost (XALATAN) 0.005 % ophthalmic solution Place 1 drop into both eyes at bedtime.    Marland Kitchen levothyroxine (SYNTHROID, LEVOTHROID) 88 MCG tablet Take 88 mcg by mouth daily before breakfast.    . nitroGLYCERIN (NITROSTAT) 0.4 MG SL tablet Place 0.4 mg under the tongue every 5 (five)  minutes as needed. For chest pain.    . prednisoLONE acetate (PRED FORTE) 1 % ophthalmic suspension Place 1 drop into both eyes 4 (four) times daily.     No current facility-administered medications for this visit.    Allergies:   Crestor; Eggs or egg-derived products; Lemon juice; Milk-related compounds; and Shellfish allergy    Social History:  The patient  reports that she has never smoked. She has never used smokeless tobacco. She reports that she does not drink alcohol or use illicit drugs.   Family History:  The patient's family history includes Cancer in her brother and mother; Heart attack in her father; Heart disease in her father; Hypertension in her father; Stroke in her mother. There is no history of Anesthesia problems, Hypotension, Malignant hyperthermia, or Pseudochol deficiency.    ROS:  Please see the history of present illness.   Otherwise, review of systems are positive for none.   All other systems are reviewed and negative.    PHYSICAL EXAM: VS:  BP 132/70 mmHg  Pulse 71  Ht 5\' 5"  (1.651 m)  Wt 225 lb 9.6 oz (102.331 kg)  BMI 37.54 kg/m2 , BMI Body mass index is 37.54 kg/(m^2). GEN: Well nourished, well developed, in no acute distress HEENT: normal Neck: no JVD, carotid bruits, or masses Cardiac: RRR; no murmurs, rubs, or gallops,no edema  Respiratory:  clear to auscultation bilaterally, normal work of breathing GI: soft, nontender, nondistended, + BS MS: no deformity or atrophy Skin: warm and dry, no rash Neuro:  Strength and sensation are intact Psych: euthymic mood, full affect Vascular: Femoral pulses are +1 bilaterally.  EKG:  EKG is not ordered today.    Recent Labs: 01/20/2015: ALT 12*; BUN 7; Creatinine, Ser 0.87; Hemoglobin 12.8; Platelets 242; Potassium 3.6; Sodium 142    Lipid Panel    Component Value Date/Time   CHOL 198 05/15/2014 0947   TRIG 68.0 05/15/2014 0947   HDL 47.00 05/15/2014 0947   CHOLHDL 4 05/15/2014 0947   VLDL 13.6  05/15/2014 0947   LDLCALC 137* 05/15/2014 0947   LDLDIRECT 196.5 12/12/2012 1104      Wt Readings from Last 3 Encounters:  03/19/15 225 lb 9.6 oz (102.331 kg)  03/18/15 219 lb 12.8 oz (99.701 kg)  02/14/14 216 lb (97.977 kg)         ASSESSMENT AND PLAN:  1.  Peripheral arterial disease: She has known history of left external iliac artery stenting in the past. Most recent noninvasive vascular evaluation last month showed normal ABI bilaterally. Duplex showed evidence of significant right common iliac artery stenosis with a  peak velocity of 365 and borderline significant disease affecting the left common and external iliac arteries. Her symptoms are out of proportion to her vascular findings. I suspect that her previous back surgery might be contributing to some of her symptoms. She reports no symptomatic relief in the past with previous left iliac stenting and given her overall limited walking ability, I don't think she would benefit much from revascularization at the present time. I recommend continuing medical therapy and repeat studies in one year. It might be worth to consider referring the patient to a spine specialist.  2. Hyperlipidemia: Unfortunately, she is intolerant to statins.  3. Essential hypertension blood pressure is reasonably controlled on current medications.     Disposition:   FU with me in 1 year  Signed,  Kathlyn Sacramento, MD  03/19/2015 12:50 PM    [place]

## 2015-03-19 NOTE — Patient Instructions (Addendum)
Medication Instructions:  Your physician recommends that you continue on your current medications as directed. Please refer to the Current Medication list given to you today.  Labwork: No new orders.   Testing/Procedures: Your physician has requested that you have an aorto-iliac duplex in 1 YEAR. During this test, an ultrasound is used to evaluate the aorta and iliac arteries. Allow 30 minutes for this exam. Do not eat after midnight the day before and avoid carbonated beverages  Follow-Up: Your physician wants you to follow-up in: 1 YEAR with Dr Fletcher Anon.  You will receive a reminder letter in the mail two months in advance. If you don't receive a letter, please call our office to schedule the follow-up appointment.    Any Other Special Instructions Will Be Listed Below (If Applicable).     If you need a refill on your cardiac medications before your next appointment, please call your pharmacy.

## 2015-05-27 DIAGNOSIS — H401133 Primary open-angle glaucoma, bilateral, severe stage: Secondary | ICD-10-CM | POA: Diagnosis not present

## 2015-05-27 DIAGNOSIS — Z961 Presence of intraocular lens: Secondary | ICD-10-CM | POA: Diagnosis not present

## 2015-06-28 DIAGNOSIS — M75102 Unspecified rotator cuff tear or rupture of left shoulder, not specified as traumatic: Secondary | ICD-10-CM | POA: Diagnosis not present

## 2015-06-28 DIAGNOSIS — H548 Legal blindness, as defined in USA: Secondary | ICD-10-CM | POA: Diagnosis not present

## 2015-06-28 DIAGNOSIS — Z1389 Encounter for screening for other disorder: Secondary | ICD-10-CM | POA: Diagnosis not present

## 2015-06-28 DIAGNOSIS — I739 Peripheral vascular disease, unspecified: Secondary | ICD-10-CM | POA: Diagnosis not present

## 2015-06-28 DIAGNOSIS — I2510 Atherosclerotic heart disease of native coronary artery without angina pectoris: Secondary | ICD-10-CM | POA: Diagnosis not present

## 2015-06-28 DIAGNOSIS — I10 Essential (primary) hypertension: Secondary | ICD-10-CM | POA: Diagnosis not present

## 2015-06-28 DIAGNOSIS — E559 Vitamin D deficiency, unspecified: Secondary | ICD-10-CM | POA: Diagnosis not present

## 2015-06-28 DIAGNOSIS — E039 Hypothyroidism, unspecified: Secondary | ICD-10-CM | POA: Diagnosis not present

## 2015-07-16 ENCOUNTER — Ambulatory Visit: Payer: PPO | Attending: Family Medicine | Admitting: Physical Therapy

## 2015-07-16 ENCOUNTER — Encounter: Payer: Self-pay | Admitting: Physical Therapy

## 2015-07-16 DIAGNOSIS — M542 Cervicalgia: Secondary | ICD-10-CM | POA: Insufficient documentation

## 2015-07-16 DIAGNOSIS — M25612 Stiffness of left shoulder, not elsewhere classified: Secondary | ICD-10-CM | POA: Diagnosis not present

## 2015-07-16 DIAGNOSIS — R252 Cramp and spasm: Secondary | ICD-10-CM | POA: Insufficient documentation

## 2015-07-16 DIAGNOSIS — M25512 Pain in left shoulder: Secondary | ICD-10-CM | POA: Insufficient documentation

## 2015-07-16 NOTE — Therapy (Signed)
[place], [place], [address] Phone: [phone]   Fax:  [phone]  Physical Therapy Evaluation  Patient Details  Name: Yvonne Tran MRN: [id number] Date of Birth: May 11, 1934 Referring Provider: Theadore Nan  Encounter Date: 07/16/2015      PT End of Session - 07/16/15 0957    Visit Number 1   Date for PT Re-Evaluation 09/16/15   PT Start Time 0926   PT Stop Time 1028   PT Time Calculation (min) 62 min   Activity Tolerance Patient tolerated treatment well   Behavior During Therapy [facility] for tasks assessed/performed      Past Medical History  Diagnosis Date  . Hyperlipidemia   . Hypertension   . Lipoma   . Cardiac angina ([place])   . Heart attack ([place])     30 YRS AGO  DR VARANASI  EAGLE CARD  . Stroke [facility])     MINI STROKE  YRS AGO   . Shortness of breath     WITH EXERTION   . Recurrent upper respiratory infection (URI) MAY 2013    RECENT COLD   . Goiter     THROID GOITER WITH COMPRESSION--PT HAVING TROUBLE SWALLOWING AND SOB  . Headache(784.0)     LEFT TEMPORAL AREA  . Pain     RIGHT THIGH FOR QUITE SOME TIME--LEG GIVES AWAY SOMETIMES.  LOWER BACK PAIN  - S/P HX OF PREVIOUS BACK SURGERY  . Glaucoma     GLAUCOMA IN   BOTH EYES:  LEGALLY BLIND IN RIGHT EYE  . Peripheral vascular disease ([place])     09/2008 STENT LEFT EXTERNAL ILIAC;  03/20/10 RE-STENT OF LEFT EXTERNAL ILIAC STENT- AT [facility] WITH DR. VARANASI    Past Surgical History  Procedure Laterality Date  . Cardiac catheterization  2012  . Eye surgery      left eye, lens replacement  . Vascular surgery      STENT PLACED 03/2010 RT LEG  . Mass excision  01/13/2011    Procedure: EXCISION MASS;  Surgeon: Stark Klein, MD;  Location: [place];  Service: General;  Laterality: Left;  Left abdominal wall mass (subcutaneous, 4-5 cm).  . Back surgery  [place] PT HAS PARALYSIS LEFT ARM AND LEG--TOOK  2&1/2 YEARS TO REGAIN USE OF LEFT SIDE--LEFT SIDE REMAINS WEAKER THAN HER RIFHT SIDE.  Marland Kitchen Appendectomy    . Thyroidectomy  08/14/2011    Procedure: THYROIDECTOMY;  Surgeon: Earnstine Regal, MD;  [facility];  Service: General;  Laterality: N/A;  Total Thyroidectomy    There were no vitals filed for this visit.       Subjective Assessment - 07/16/15 0930    Subjective Patient reports neck and left shoulder pain, no known cause, reports that this started about 4 months ago.  No x-rays have been done   Limitations Lifting;Reading;House hold activities   Patient Stated Goals use my arm, have less pain   Currently in Pain? Yes   Pain Score 6    Pain Location Neck   Pain Orientation Left   Pain Descriptors / Indicators Tightness;Aching   Pain Type Chronic pain   Pain Onset More than a month ago   Pain Frequency Constant   Aggravating Factors  reaching, turning head, and worse at night pain up to 10/10   Pain Relieving Factors lying down helps, at best 5/10   Effect of Pain on  Daily Activities limits all ADL's            [facility] PT Assessment - 07/16/15 0001    Assessment   Medical Diagnosis neck and shoulder pain   Referring Provider Theadore Nan   Onset Date/Surgical Date 06/28/15   Hand Dominance Right   Prior Therapy none   Precautions   Precautions None   Balance Screen   Has the patient fallen in the past 6 months Yes   How many times? 2   Has the patient had a decrease in activity level because of a fear of falling?  No   Is the patient reluctant to leave their home because of a fear of falling?  No   Home Environment   Additional Comments no stairs, lives alone, does her housework   Prior Function   Level of Independence Independent with household mobility with device   Vocation Retired   Biomedical scientist blind in right eye   Leisure no exercise   ROM / Strength   AROM / PROM / Strength AROM;Strength   AROM   Overall AROM Comments AROM of the cervical spine  decreased 50% with c/o tightness and pain in the neck   AROM Assessment Site Shoulder   Right/Left Shoulder Left   Left Shoulder Flexion 110 Degrees   Left Shoulder ABduction 80 Degrees   Left Shoulder Internal Rotation 50 Degrees   Left Shoulder External Rotation 60 Degrees   Strength   Overall Strength Comments 3/5 with pain   Palpation   Palpation comment patient with significant spasms and tenderness in the left upper trap and in the cervical area   Special Tests    Special Tests --  pain iwht left shoulder impingement and empty can test                   [facility] Adult PT Treatment/Exercise - 07/16/15 0001    Modalities   Modalities Electrical Stimulation;Moist Heat   Moist Heat Therapy   Number Minutes Moist Heat 15 Minutes   Moist Heat Location Cervical   Electrical Stimulation   Electrical Stimulation Location cervical and into the left upper trap   Electrical Stimulation Action IFC   Electrical Stimulation Parameters sitting   Electrical Stimulation Goals Pain                PT Education - 07/16/15 0956    Education provided Yes   Education Details shoulder shrugs and retraction, cervical retraction   Person(s) Educated Patient   Methods Explanation;Handout;Demonstration   Comprehension Verbalized understanding          PT Short Term Goals - 07/16/15 1003    PT SHORT TERM GOAL #1   Title independent with initial HEP   Time 2   Period Weeks   Status New           PT Long Term Goals - 07/16/15 1003    PT LONG TERM GOAL #1   Title decrease pain 50%   Time 8   Period Weeks   Status New   PT LONG TERM GOAL #2   Title increase cervical ROM 25%   Time 8   Period Weeks   Status New   PT LONG TERM GOAL #3   Title increase left shoulder AROM to 130 degrees flexion   Time 8   Period Weeks   Status New   PT LONG TERM GOAL #4   Title report no issue with doing her hair or with  dressing   Time 8   Period Weeks   Status New                Plan - 07/16/15 0959    Clinical Impression Statement Patient with neck and left upper trap and shoulder pain.  She c/o mostly left neck and upper trap pain, MD order also mentioned the left RC syndrome, at this time the spasms and pain in the neck seem to be the issue bothering her the most, she did have pain with left shoulder impingment and empty can tests but again the pain was in the left neck and trap and not the shoulder.  She is missing 50% of her cervical motion and very limited in the left shoulder AROM.  She has a lot of tendernes in the left upper trap and neck area   Rehab Potential Good   PT Frequency 2x / week   PT Duration 4 weeks   PT Treatment/Interventions ADLs/Self Care Home Management;Cryotherapy;Electrical Stimulation;Moist Heat;Therapeutic exercise;Iontophoresis 4mg /ml Dexamethasone;Therapeutic activities;Ultrasound;Neuromuscular re-education;Patient/family education;Manual techniques;Passive range of motion   PT Next Visit Plan slowly add exercises   Consulted and Agree with Plan of Care Patient      Patient will benefit from skilled therapeutic intervention in order to improve the following deficits and impairments:  Decreased range of motion, Decreased strength, Increased muscle spasms, Increased fascial restricitons, Impaired UE functional use, Impaired vision/preception, Improper body mechanics, Postural dysfunction, Pain  Visit Diagnosis: Cervicalgia - Plan: PT plan of care cert/re-cert  Pain in left shoulder - Plan: PT plan of care cert/re-cert  Stiffness of left shoulder, not elsewhere classified - Plan: PT plan of care cert/re-cert  Cramp and spasm - Plan: PT plan of care cert/re-cert      G-Codes - [id number]    Functional Assessment Tool Used foto 46% limitation   Functional Limitation Other PT primary   Other PT Primary Current Status [id number]) At least 40 percent but less than 60 percent impaired, limited or restricted   Other PT  Primary Goal Status [id number]) At least 20 percent but less than 40 percent impaired, limited or restricted       Problem List Patient Active Problem List   Diagnosis Date Noted  . PAD (peripheral artery disease) ([place]) 02/14/2014  . Mixed hyperlipidemia 02/10/2013  . Pain in limb 02/10/2013  . Essential hypertension, benign 02/10/2013  . Cough 10/07/2012  . Hypothyroidism, postsurgical 09/08/2011  . Multinodular goiter (nontoxic) 07/08/2011  . Abdominal wall mass of left upper quadrant, 4 x 5 cm in anterior axillary line. 11/10/2010    Sumner Boast., PT 07/16/2015, 10:12 AM  [facility] [id number] [facility] [place], [place], [address] Phone: [phone]   Fax:  [phone]  Name: RUSHELL BRESNAN MRN: [id number] Date of Birth: 11/19/1934

## 2015-07-19 ENCOUNTER — Ambulatory Visit: Payer: PPO | Admitting: Physical Therapy

## 2015-07-19 ENCOUNTER — Encounter: Payer: Self-pay | Admitting: Physical Therapy

## 2015-07-19 DIAGNOSIS — M542 Cervicalgia: Secondary | ICD-10-CM

## 2015-07-19 DIAGNOSIS — M25512 Pain in left shoulder: Secondary | ICD-10-CM

## 2015-07-19 DIAGNOSIS — M25612 Stiffness of left shoulder, not elsewhere classified: Secondary | ICD-10-CM

## 2015-07-19 NOTE — Therapy (Signed)
[place], [place], [address] Phone: [phone]   Fax:  [phone]  Physical Therapy Treatment  Patient Details  Name: Yvonne Tran MRN: [id number] Date of Birth: 1934/09/27 Referring Provider: Theadore Nan  Encounter Date: 07/19/2015      PT End of Session - 07/19/15 0834    Visit Number 2   Date for PT Re-Evaluation 09/16/15   PT Start Time 0802   PT Stop Time 0851   PT Time Calculation (min) 49 min      Past Medical History  Diagnosis Date  . Hyperlipidemia   . Hypertension   . Lipoma   . Cardiac angina ([place])   . Heart attack ([place])     30 YRS AGO  DR VARANASI  EAGLE CARD  . Stroke [facility])     MINI STROKE  YRS AGO   . Shortness of breath     WITH EXERTION   . Recurrent upper respiratory infection (URI) MAY 2013    RECENT COLD   . Goiter     THROID GOITER WITH COMPRESSION--PT HAVING TROUBLE SWALLOWING AND SOB  . Headache(784.0)     LEFT TEMPORAL AREA  . Pain     RIGHT THIGH FOR QUITE SOME TIME--LEG GIVES AWAY SOMETIMES.  LOWER BACK PAIN  - S/P HX OF PREVIOUS BACK SURGERY  . Glaucoma     GLAUCOMA IN   BOTH EYES:  LEGALLY BLIND IN RIGHT EYE  . Peripheral vascular disease ([place])     09/2008 STENT LEFT EXTERNAL ILIAC;  03/20/10 RE-STENT OF LEFT EXTERNAL ILIAC STENT- AT [facility] WITH DR. VARANASI    Past Surgical History  Procedure Laterality Date  . Cardiac catheterization  2012  . Eye surgery      left eye, lens replacement  . Vascular surgery      STENT PLACED 03/2010 RT LEG  . Mass excision  01/13/2011    Procedure: EXCISION MASS;  Surgeon: Stark Klein, MD;  Location: [place];  Service: General;  Laterality: Left;  Left abdominal wall mass (subcutaneous, 4-5 cm).  . Back surgery  [place] PT HAS PARALYSIS LEFT ARM AND LEG--TOOK 2&1/2 YEARS TO REGAIN USE OF LEFT SIDE--LEFT SIDE REMAINS WEAKER THAN HER RIFHT SIDE.  Marland Kitchen Appendectomy    .  Thyroidectomy  08/14/2011    Procedure: THYROIDECTOMY;  Surgeon: Earnstine Regal, MD;  [facility];  Service: General;  Laterality: N/A;  Total Thyroidectomy    There were no vitals filed for this visit.      Subjective Assessment - 07/19/15 0804    Subjective felt better after last session, doing HEP   Currently in Pain? Yes   Pain Score 5    Pain Location Neck                         [facility] Adult PT Treatment/Exercise - 07/19/15 0817    Neck Exercises: Standing   Other Standing Exercises ball vs wall 10 times   Shoulder Exercises: Seated   Other Seated Exercises yellow tband 10 reps retraction,ext,abd, ER   Other Seated Exercises 1# chest press, shld flexion 10 times each   Shoulder Exercises: Standing   Other Standing Exercises 4# shruggs and rolls 15 times   Moist Heat Therapy   Number Minutes Moist Heat 15 Minutes   Moist Heat Location Cervical   Electrical Stimulation  Electrical Stimulation Location cervical and into the left upper trap   Electrical Stimulation Action IFC   Electrical Stimulation Parameters sitting   Electrical Stimulation Goals Pain                  PT Short Term Goals - 07/16/15 1003    PT SHORT TERM GOAL #1   Title independent with initial HEP   Time 2   Period Weeks   Status New           PT Long Term Goals - 07/16/15 1003    PT LONG TERM GOAL #1   Title decrease pain 50%   Time 8   Period Weeks   Status New   PT LONG TERM GOAL #2   Title increase cervical ROM 25%   Time 8   Period Weeks   Status New   PT LONG TERM GOAL #3   Title increase left shoulder AROM to 130 degrees flexion   Time 8   Period Weeks   Status New   PT LONG TERM GOAL #4   Title report no issue with doing her hair or with dressing   Time 8   Period Weeks   Status New               Plan - 07/19/15 [id number]    Clinical Impression Statement pt with some increased pain with ther ex so adjusted weight and ROM,fatigued  quickly. limited tolerance to STW d/t pain. Demo HEP mod I   PT Next Visit Plan slowly add exercises- increase HEP      Patient will benefit from skilled therapeutic intervention in order to improve the following deficits and impairments:     Visit Diagnosis: Cervicalgia  Pain in left shoulder  Stiffness of left shoulder, not elsewhere classified     Problem List Patient Active Problem List   Diagnosis Date Noted  . PAD (peripheral artery disease) ([place]) 02/14/2014  . Mixed hyperlipidemia 02/10/2013  . Pain in limb 02/10/2013  . Essential hypertension, benign 02/10/2013  . Cough 10/07/2012  . Hypothyroidism, postsurgical 09/08/2011  . Multinodular goiter (nontoxic) 07/08/2011  . Abdominal wall mass of left upper quadrant, 4 x 5 cm in anterior axillary line. 11/10/2010    Dhani Dannemiller,ANGIE PTA 07/19/2015, 8:36 AM  [place], [place], [address] Phone: [phone]   Fax:  [phone]  Name: Yvonne Tran MRN: [id number] Date of Birth: 03/27/1934

## 2015-07-23 ENCOUNTER — Encounter: Payer: Self-pay | Admitting: Physical Therapy

## 2015-07-23 ENCOUNTER — Ambulatory Visit: Payer: PPO | Admitting: Physical Therapy

## 2015-07-23 DIAGNOSIS — M542 Cervicalgia: Secondary | ICD-10-CM | POA: Diagnosis not present

## 2015-07-23 DIAGNOSIS — M25512 Pain in left shoulder: Secondary | ICD-10-CM

## 2015-07-23 DIAGNOSIS — M25612 Stiffness of left shoulder, not elsewhere classified: Secondary | ICD-10-CM

## 2015-07-23 NOTE — Therapy (Signed)
[place], [place], [address] Phone: [phone]   Fax:  [phone]  Physical Therapy Treatment  Patient Details  Name: Yvonne Tran MRN: [id number] Date of Birth: 1934-07-23 Referring Provider: Theadore Nan  Encounter Date: 07/23/2015      PT End of Session - 07/23/15 0936    Visit Number 3   Date for PT Re-Evaluation 09/16/15   PT Start Time 0900   PT Stop Time 0950   PT Time Calculation (min) 50 min      Past Medical History  Diagnosis Date  . Hyperlipidemia   . Hypertension   . Lipoma   . Cardiac angina ([place])   . Heart attack ([place])     30 YRS AGO  DR VARANASI  EAGLE CARD  . Stroke [facility])     MINI STROKE  YRS AGO   . Shortness of breath     WITH EXERTION   . Recurrent upper respiratory infection (URI) MAY 2013    RECENT COLD   . Goiter     THROID GOITER WITH COMPRESSION--PT HAVING TROUBLE SWALLOWING AND SOB  . Headache(784.0)     LEFT TEMPORAL AREA  . Pain     RIGHT THIGH FOR QUITE SOME TIME--LEG GIVES AWAY SOMETIMES.  LOWER BACK PAIN  - S/P HX OF PREVIOUS BACK SURGERY  . Glaucoma     GLAUCOMA IN   BOTH EYES:  LEGALLY BLIND IN RIGHT EYE  . Peripheral vascular disease ([place])     09/2008 STENT LEFT EXTERNAL ILIAC;  03/20/10 RE-STENT OF LEFT EXTERNAL ILIAC STENT- AT [facility] WITH DR. VARANASI    Past Surgical History  Procedure Laterality Date  . Cardiac catheterization  2012  . Eye surgery      left eye, lens replacement  . Vascular surgery      STENT PLACED 03/2010 RT LEG  . Mass excision  01/13/2011    Procedure: EXCISION MASS;  Surgeon: Stark Klein, MD;  Location: [place];  Service: General;  Laterality: Left;  Left abdominal wall mass (subcutaneous, 4-5 cm).  . Back surgery  [place] PT HAS PARALYSIS LEFT ARM AND LEG--TOOK 2&1/2 YEARS TO REGAIN USE OF LEFT SIDE--LEFT SIDE REMAINS WEAKER THAN HER RIFHT SIDE.  Marland Kitchen Appendectomy    .  Thyroidectomy  08/14/2011    Procedure: THYROIDECTOMY;  Surgeon: Earnstine Regal, MD;  [facility];  Service: General;  Laterality: N/A;  Total Thyroidectomy    There were no vitals filed for this visit.      Subjective Assessment - 07/23/15 0904    Subjective feeling much better   Currently in Pain? Yes   Pain Score 4    Pain Location Neck   Pain Orientation Left                         [facility] Adult PT Treatment/Exercise - 07/23/15 0001    Neck Exercises: Machines for Strengthening   UBE (Upper Arm Bike) L 2 2 fwd /2 back   Cybex Row 15 # 2 sets 10   Other Machines for Strengthening lat pull 15# 2 sets 10   Shoulder Exercises: Seated   Row Strengthening;Both;10 reps;Theraband  2 sets   Theraband Level (Shoulder Row) Level 1 (Yellow)   External Rotation Strengthening;Both;10 reps;Theraband  2 sets    Theraband Level (Shoulder External Rotation) Level 1 (Yellow)  Other Seated Exercises 1# chest press, shld flexion and abd 10 times each   Moist Heat Therapy   Number Minutes Moist Heat 15 Minutes   Moist Heat Location Cervical   Electrical Stimulation   Electrical Stimulation Location cervical and into the left upper trap   Electrical Stimulation Action IFC   Electrical Stimulation Goals Pain                PT Education - 07/23/15 0919    Education provided Yes   Education Details yellow tband ext,retraction and ER   Person(s) Educated Patient   Methods Explanation;Demonstration;Handout   Comprehension Verbalized understanding;Returned demonstration          PT Short Term Goals - 07/23/15 0936    PT SHORT TERM GOAL #1   Title independent with initial HEP   Status Achieved           PT Long Term Goals - 07/23/15 0936    PT LONG TERM GOAL #1   Title decrease pain 50%   Status On-going   PT LONG TERM GOAL #2   Title increase cervical ROM 25%   Status On-going   PT LONG TERM GOAL #3   Title increase left shoulder AROM to 130  degrees flexion   Baseline AROM standing 133   Status Achieved   PT LONG TERM GOAL #4   Title report no issue with doing her hair or with dressing   Status On-going               Plan - 07/23/15 0937    Clinical Impression Statement STG met. Progressing with LTGs. Increased toleance to ther ex but Left arm does fatigue quicker than RT. Decreased overall pain.   PT Next Visit Plan Ther ex and BM/ifting education      Patient will benefit from skilled therapeutic intervention in order to improve the following deficits and impairments:     Visit Diagnosis: Cervicalgia  Pain in left shoulder  Stiffness of left shoulder, not elsewhere classified     Problem List Patient Active Problem List   Diagnosis Date Noted  . PAD (peripheral artery disease) ([place]) 02/14/2014  . Mixed hyperlipidemia 02/10/2013  . Pain in limb 02/10/2013  . Essential hypertension, benign 02/10/2013  . Cough 10/07/2012  . Hypothyroidism, postsurgical 09/08/2011  . Multinodular goiter (nontoxic) 07/08/2011  . Abdominal wall mass of left upper quadrant, 4 x 5 cm in anterior axillary line. 11/10/2010    Rachella Basden,ANGIE  PTA  07/23/2015, 9:56 AM  [address] [place], [place], [address] Phone: [phone]   Fax:  [phone]  Name: Yvonne Tran MRN: [id number] Date of Birth: Oct 07, 1934

## 2015-07-26 ENCOUNTER — Encounter: Payer: Self-pay | Admitting: Physical Therapy

## 2015-07-26 ENCOUNTER — Ambulatory Visit: Payer: PPO | Admitting: Physical Therapy

## 2015-07-26 DIAGNOSIS — M542 Cervicalgia: Secondary | ICD-10-CM | POA: Diagnosis not present

## 2015-07-26 DIAGNOSIS — M25612 Stiffness of left shoulder, not elsewhere classified: Secondary | ICD-10-CM

## 2015-07-26 DIAGNOSIS — M25512 Pain in left shoulder: Secondary | ICD-10-CM

## 2015-07-26 NOTE — Therapy (Signed)
[place], [place], [address] Phone: [phone]   Fax:  [phone]  Physical Therapy Treatment  Patient Details  Name: Yvonne Tran MRN: [id number] Date of Birth: 03-28-1934 Referring Provider: Theadore Nan  Encounter Date: 07/26/2015      PT End of Session - 07/26/15 0950    Visit Number 4   Date for PT Re-Evaluation 09/16/15   PT Start Time 0915   PT Stop Time 0955   PT Time Calculation (min) 40 min      Past Medical History  Diagnosis Date  . Hyperlipidemia   . Hypertension   . Lipoma   . Cardiac angina ([place])   . Heart attack ([place])     30 YRS AGO  DR VARANASI  EAGLE CARD  . Stroke [facility])     MINI STROKE  YRS AGO   . Shortness of breath     WITH EXERTION   . Recurrent upper respiratory infection (URI) MAY 2013    RECENT COLD   . Goiter     THROID GOITER WITH COMPRESSION--PT HAVING TROUBLE SWALLOWING AND SOB  . Headache(784.0)     LEFT TEMPORAL AREA  . Pain     RIGHT THIGH FOR QUITE SOME TIME--LEG GIVES AWAY SOMETIMES.  LOWER BACK PAIN  - S/P HX OF PREVIOUS BACK SURGERY  . Glaucoma     GLAUCOMA IN   BOTH EYES:  LEGALLY BLIND IN RIGHT EYE  . Peripheral vascular disease ([place])     09/2008 STENT LEFT EXTERNAL ILIAC;  03/20/10 RE-STENT OF LEFT EXTERNAL ILIAC STENT- AT [facility] WITH DR. VARANASI    Past Surgical History  Procedure Laterality Date  . Cardiac catheterization  2012  . Eye surgery      left eye, lens replacement  . Vascular surgery      STENT PLACED 03/2010 RT LEG  . Mass excision  01/13/2011    Procedure: EXCISION MASS;  Surgeon: Stark Klein, MD;  Location: [place];  Service: General;  Laterality: Left;  Left abdominal wall mass (subcutaneous, 4-5 cm).  . Back surgery  [place] PT HAS PARALYSIS LEFT ARM AND LEG--TOOK 2&1/2 YEARS TO REGAIN USE OF LEFT SIDE--LEFT SIDE REMAINS WEAKER THAN HER RIFHT SIDE.  Marland Kitchen Appendectomy    .  Thyroidectomy  08/14/2011    Procedure: THYROIDECTOMY;  Surgeon: Earnstine Regal, MD;  [facility];  Service: General;  Laterality: N/A;  Total Thyroidectomy    There were no vitals filed for this visit.      Subjective Assessment - 07/26/15 0909    Subjective did those ex and hurting,spend all day in bed. neck and back muscles hurting   Currently in Pain? Yes   Pain Score 10-Worst pain ever   Pain Location Neck   Pain Orientation Right;Left;Upper;Lower                         [facility] Adult PT Treatment/Exercise - 07/26/15 0001    Modalities   Modalities Ultrasound   Moist Heat Therapy   Number Minutes Moist Heat 20 Minutes   Moist Heat Location Cervical;Lumbar Spine   Electrical Stimulation   Electrical Stimulation Location cerv/lumb   Electrical Stimulation Action premod   Electrical Stimulation Goals Pain   Ultrasound   Ultrasound Location bilateral Cerv and traps   Ultrasound Parameters 1 mhz 1. 3 w/cm2   Manual  Therapy   Manual Therapy Soft tissue mobilization   Manual therapy comments gentle STW to cerv/traps  limited tolerance                  PT Short Term Goals - 07/23/15 0936    PT SHORT TERM GOAL #1   Title independent with initial HEP   Status Achieved           PT Long Term Goals - 07/23/15 0936    PT LONG TERM GOAL #1   Title decrease pain 50%   Status On-going   PT LONG TERM GOAL #2   Title increase cervical ROM 25%   Status On-going   PT LONG TERM GOAL #3   Title increase left shoulder AROM to 130 degrees flexion   Baseline AROM standing 133   Status Achieved   PT LONG TERM GOAL #4   Title report no issue with doing her hair or with dressing   Status On-going               Plan - 07/26/15 0950    Clinical Impression Statement pt arrived to today with increased pain cerv and lumb,verb from HEP. limited tolerance to STW ,tightness noted in traps. Miniaml pain relief from modalities   PT Next Visit Plan  assess pain and  progress as tolerated-very light ther ex      Patient will benefit from skilled therapeutic intervention in order to improve the following deficits and impairments:  Decreased range of motion, Decreased strength, Increased muscle spasms, Increased fascial restricitons, Impaired UE functional use, Impaired vision/preception, Improper body mechanics, Postural dysfunction, Pain  Visit Diagnosis: Pain in left shoulder  Stiffness of left shoulder, not elsewhere classified  Cervicalgia     Problem List Patient Active Problem List   Diagnosis Date Noted  . PAD (peripheral artery disease) ([place]) 02/14/2014  . Mixed hyperlipidemia 02/10/2013  . Pain in limb 02/10/2013  . Essential hypertension, benign 02/10/2013  . Cough 10/07/2012  . Hypothyroidism, postsurgical 09/08/2011  . Multinodular goiter (nontoxic) 07/08/2011  . Abdominal wall mass of left upper quadrant, 4 x 5 cm in anterior axillary line. 11/10/2010    Pearlean Sabina,ANGIE PTA 07/26/2015, 9:52 AM  [address] [place], [place], [address] Phone: [phone]   Fax:  [phone]  Name: Yvonne Tran MRN: [id number] Date of Birth: Feb 05, 1934

## 2015-08-08 ENCOUNTER — Ambulatory Visit: Payer: PPO | Attending: Family Medicine | Admitting: Physical Therapy

## 2015-08-08 ENCOUNTER — Encounter: Payer: Self-pay | Admitting: Physical Therapy

## 2015-08-08 DIAGNOSIS — M542 Cervicalgia: Secondary | ICD-10-CM | POA: Insufficient documentation

## 2015-08-08 DIAGNOSIS — R252 Cramp and spasm: Secondary | ICD-10-CM | POA: Insufficient documentation

## 2015-08-08 DIAGNOSIS — M25612 Stiffness of left shoulder, not elsewhere classified: Secondary | ICD-10-CM | POA: Insufficient documentation

## 2015-08-08 DIAGNOSIS — M25512 Pain in left shoulder: Secondary | ICD-10-CM | POA: Insufficient documentation

## 2015-08-08 NOTE — Therapy (Signed)
[place], [place], [address] Phone: [phone]   Fax:  [phone]  Physical Therapy Treatment  Patient Details  Name: Yvonne Tran MRN: [id number] Date of Birth: [birth year] Referring Provider: Theadore Nan  Encounter Date: 08/08/2015      PT End of Session - 08/08/15 1030    Visit Number 5   Date for PT Re-Evaluation 09/16/15   PT Start Time 1005   PT Stop Time 1045   PT Time Calculation (min) 40 min      Past Medical History:  Diagnosis Date  . Cardiac angina ([place])   . Glaucoma    GLAUCOMA IN   BOTH EYES:  LEGALLY BLIND IN RIGHT EYE  . Goiter    THROID GOITER WITH COMPRESSION--PT HAVING TROUBLE SWALLOWING AND SOB  . Headache(784.0)    LEFT TEMPORAL AREA  . Heart attack ([place])    30 YRS AGO  DR VARANASI  EAGLE CARD  . Hyperlipidemia   . Hypertension   . Lipoma   . Pain    RIGHT THIGH FOR QUITE SOME TIME--LEG GIVES AWAY SOMETIMES.  LOWER BACK PAIN  - S/P HX OF PREVIOUS BACK SURGERY  . Peripheral vascular disease ([place])    09/2008 STENT LEFT EXTERNAL ILIAC;  03/20/10 RE-STENT OF LEFT EXTERNAL ILIAC STENT- AT [facility] WITH DR. VARANASI  . Recurrent upper respiratory infection (URI) MAY 2013   RECENT COLD   . Shortness of breath    WITH EXERTION   . Stroke [facility])    MINI STROKE  YRS AGO     Past Surgical History:  Procedure Laterality Date  . APPENDECTOMY    . BACK SURGERY  1988   PT STATES AFTER THE BACK SURGERY PT HAS PARALYSIS LEFT ARM AND LEG--TOOK 2&1/2 YEARS TO REGAIN USE OF LEFT SIDE--LEFT SIDE REMAINS WEAKER THAN HER RIFHT SIDE.  Marland Kitchen CARDIAC CATHETERIZATION  2012  . EYE SURGERY     left eye, lens replacement  . MASS EXCISION  01/13/2011   Procedure: EXCISION MASS;  Surgeon: Stark Klein, MD;  Location: [place];  Service: General;  Laterality: Left;  Left abdominal wall mass (subcutaneous, 4-5 cm).  . THYROIDECTOMY  08/14/2011   Procedure: THYROIDECTOMY;  Surgeon: Earnstine Regal, MD;  [facility];  Service: General;  Laterality: N/A;  Total Thyroidectomy  . VASCULAR SURGERY     STENT PLACED 03/2010 RT LEG    There were no vitals filed for this visit.      Subjective Assessment - 08/08/15 1005    Subjective just not good, I think I need to see MD. Temp relief from modalities but by the time I ride bus home its back.   Currently in Pain? Yes   Pain Score 6    Pain Location Neck   Pain Orientation Left                         [facility] Adult PT Treatment/Exercise - 08/08/15 0001      Moist Heat Therapy   Number Minutes Moist Heat 15 Minutes   Moist Heat Location Cervical     Electrical Stimulation   Electrical Stimulation Location cerv/trap/rhom   Electrical Stimulation Action IFC   Electrical Stimulation Goals Pain     Ultrasound   Ultrasound Location Bilateral Cerv/trap   Ultrasound Parameters 3.3 mhz I.3 w/cm@     Manual Therapy   Manual Therapy Soft tissue mobilization;Passive  ROM   Manual therapy comments gentle -limited pt tolerance                  PT Short Term Goals - 07/23/15 0936      PT SHORT TERM GOAL #1   Title independent with initial HEP   Status Achieved           PT Long Term Goals - 08/08/15 1010      PT LONG TERM GOAL #1   Title decrease pain 50%   Status Not Met     PT LONG TERM GOAL #2   Title increase cervical ROM 25%   Baseline flex and ext limited 25% , RT rot limited 50%, left rotation and SB limited 75%     PT LONG TERM GOAL #3   Title increase left shoulder AROM to 130 degrees flexion   Baseline weakness in bilateral shld noted   Status Achieved     PT LONG TERM GOAL #4   Title report no issue with doing her hair or with dressing   Status On-going               Plan - 08/08/15 1031    Clinical Impression Statement no changes in pain and pt very concerned-wanting to see MD. Cerv ROM limited significantly to LEft. Denies radiating symptoms. Increased left  shld ROM ,bilateral shld weakness noted.   PT Next Visit Plan HOLD at pt request to f/u with MD      Patient will benefit from skilled therapeutic intervention in order to improve the following deficits and impairments:  Decreased range of motion, Decreased strength, Increased muscle spasms, Increased fascial restricitons, Impaired UE functional use, Impaired vision/preception, Improper body mechanics, Postural dysfunction, Pain  Visit Diagnosis: Cervicalgia  Stiffness of left shoulder, not elsewhere classified  Cramp and spasm     Problem List Patient Active Problem List   Diagnosis Date Noted  . PAD (peripheral artery disease) ([place]) 02/14/2014  . Mixed hyperlipidemia 02/10/2013  . Pain in limb 02/10/2013  . Essential hypertension, benign 02/10/2013  . Cough 10/07/2012  . Hypothyroidism, postsurgical 09/08/2011  . Multinodular goiter (nontoxic) 07/08/2011  . Abdominal wall mass of left upper quadrant, 4 x 5 cm in anterior axillary line. 11/10/2010    Jermaine Tholl,ANGIE PTA 08/08/2015, 10:34 AM  [address] [place], [place], [address] Phone: [phone]   Fax:  [phone]  Name: Yvonne Tran MRN: [id number] Date of Birth: 21-Jan-1934

## 2015-08-23 DIAGNOSIS — R102 Pelvic and perineal pain: Secondary | ICD-10-CM | POA: Diagnosis not present

## 2015-08-23 DIAGNOSIS — L659 Nonscarring hair loss, unspecified: Secondary | ICD-10-CM | POA: Diagnosis not present

## 2015-08-23 DIAGNOSIS — R1312 Dysphagia, oropharyngeal phase: Secondary | ICD-10-CM | POA: Diagnosis not present

## 2015-08-23 DIAGNOSIS — G8929 Other chronic pain: Secondary | ICD-10-CM | POA: Diagnosis not present

## 2015-08-23 DIAGNOSIS — M25512 Pain in left shoulder: Secondary | ICD-10-CM | POA: Diagnosis not present

## 2015-08-23 DIAGNOSIS — M542 Cervicalgia: Secondary | ICD-10-CM | POA: Diagnosis not present

## 2015-08-26 ENCOUNTER — Other Ambulatory Visit: Payer: Self-pay | Admitting: Family Medicine

## 2015-08-26 ENCOUNTER — Telehealth: Payer: Self-pay | Admitting: Physical Therapy

## 2015-08-26 DIAGNOSIS — R1030 Lower abdominal pain, unspecified: Secondary | ICD-10-CM

## 2015-08-26 NOTE — Telephone Encounter (Signed)
Dr. Addison Lank called and said that Yvonne Tran can continue with PT, her issues are muscular.

## 2015-09-03 ENCOUNTER — Ambulatory Visit: Payer: PPO | Admitting: Physical Therapy

## 2015-09-03 ENCOUNTER — Encounter: Payer: Self-pay | Admitting: Physical Therapy

## 2015-09-03 DIAGNOSIS — M542 Cervicalgia: Secondary | ICD-10-CM | POA: Diagnosis not present

## 2015-09-03 DIAGNOSIS — M25612 Stiffness of left shoulder, not elsewhere classified: Secondary | ICD-10-CM

## 2015-09-03 DIAGNOSIS — M25512 Pain in left shoulder: Secondary | ICD-10-CM

## 2015-09-03 DIAGNOSIS — R252 Cramp and spasm: Secondary | ICD-10-CM

## 2015-09-03 NOTE — Therapy (Signed)
[place], [place], [address] Phone: [phone]   Fax:  [phone]  Physical Therapy Treatment  Patient Details  Name: Yvonne Tran MRN: [id number] Date of Birth: Sep 13, 1934 Referring Provider: Theadore Nan  Encounter Date: 09/03/2015      PT End of Session - 09/03/15 1135    Visit Number 6   Date for PT Re-Evaluation 09/16/15   PT Start Time 1100   PT Stop Time 1149   PT Time Calculation (min) 49 min   Activity Tolerance Patient tolerated treatment well   Behavior During Therapy [facility] for tasks assessed/performed      Past Medical History:  Diagnosis Date  . Cardiac angina ([place])   . Glaucoma    GLAUCOMA IN   BOTH EYES:  LEGALLY BLIND IN RIGHT EYE  . Goiter    THROID GOITER WITH COMPRESSION--PT HAVING TROUBLE SWALLOWING AND SOB  . Headache(784.0)    LEFT TEMPORAL AREA  . Heart attack ([place])    30 YRS AGO  DR VARANASI  EAGLE CARD  . Hyperlipidemia   . Hypertension   . Lipoma   . Pain    RIGHT THIGH FOR QUITE SOME TIME--LEG GIVES AWAY SOMETIMES.  LOWER BACK PAIN  - S/P HX OF PREVIOUS BACK SURGERY  . Peripheral vascular disease ([place])    09/2008 STENT LEFT EXTERNAL ILIAC;  03/20/10 RE-STENT OF LEFT EXTERNAL ILIAC STENT- AT [facility] WITH DR. VARANASI  . Recurrent upper respiratory infection (URI) MAY 2013   RECENT COLD   . Shortness of breath    WITH EXERTION   . Stroke [facility])    MINI STROKE  YRS AGO     Past Surgical History:  Procedure Laterality Date  . APPENDECTOMY    . BACK SURGERY  1988   PT STATES AFTER THE BACK SURGERY PT HAS PARALYSIS LEFT ARM AND LEG--TOOK 2&1/2 YEARS TO REGAIN USE OF LEFT SIDE--LEFT SIDE REMAINS WEAKER THAN HER RIFHT SIDE.  Marland Kitchen CARDIAC CATHETERIZATION  2012  . EYE SURGERY     left eye, lens replacement  . MASS EXCISION  01/13/2011   Procedure: EXCISION MASS;  Surgeon: Stark Klein, MD;  Location: [place];  Service: General;  Laterality: Left;  Left  abdominal wall mass (subcutaneous, 4-5 cm).  . THYROIDECTOMY  08/14/2011   Procedure: THYROIDECTOMY;  Surgeon: Earnstine Regal, MD;  [facility];  Service: General;  Laterality: N/A;  Total Thyroidectomy  . VASCULAR SURGERY     STENT PLACED 03/2010 RT LEG    There were no vitals filed for this visit.      Subjective Assessment - 09/03/15 1100    Subjective Pt reports that she still has pain and at night it gets worst.   Currently in Pain? Yes   Pain Score 6    Pain Location Neck   Pain Orientation Left                         [facility] Adult PT Treatment/Exercise - 09/03/15 0001      Moist Heat Therapy   Number Minutes Moist Heat 15 Minutes   Moist Heat Location Cervical     Electrical Stimulation   Electrical Stimulation Location cerv/trap/rhom   Electrical Stimulation Goals Pain     Ultrasound   Ultrasound Location L cerv/trap   Ultrasound Parameters 1MHz 1.1w/cm2     Manual Therapy   Manual Therapy Soft tissue mobilization;Passive ROM  Soft tissue mobilization Occipital release, posterior para spinales, levator stretch   Passive ROM Cervical all directions                  PT Short Term Goals - 07/23/15 0936      PT SHORT TERM GOAL #1   Title independent with initial HEP   Status Achieved           PT Long Term Goals - 08/08/15 1010      PT LONG TERM GOAL #1   Title decrease pain 50%   Status Not Met     PT LONG TERM GOAL #2   Title increase cervical ROM 25%   Baseline flex and ext limited 25% , RT rot limited 50%, left rotation and SB limited 75%     PT LONG TERM GOAL #3   Title increase left shoulder AROM to 130 degrees flexion   Baseline weakness in bilateral shld noted   Status Achieved     PT LONG TERM GOAL #4   Title report no issue with doing her hair or with dressing   Status On-going               Plan - 09/03/15 1136    Clinical Impression Statement Pt reports that she always has pain but some times  it lessens. Reports going to MD X-rays negative. MT performed while Pt in supine. Pt PT has good PROM with cervical rotation, pain on L with R rotation. Some tightness noted in posterior para spinales. Pt with a forward head ans often looks down. Pt informed of the importance of having good posture maintain the natural curvature in spine.   Rehab Potential Good   PT Frequency 2x / week   PT Duration 4 weeks   PT Next Visit Plan assess tx, decrease pain to progress to more active treatment.      Patient will benefit from skilled therapeutic intervention in order to improve the following deficits and impairments:  Decreased range of motion, Decreased strength, Increased muscle spasms, Increased fascial restricitons, Impaired UE functional use, Impaired vision/preception, Improper body mechanics, Postural dysfunction, Pain  Visit Diagnosis: Cervicalgia  Stiffness of left shoulder, not elsewhere classified  Cramp and spasm  Pain in left shoulder     Problem List Patient Active Problem List   Diagnosis Date Noted  . PAD (peripheral artery disease) ([place]) 02/14/2014  . Mixed hyperlipidemia 02/10/2013  . Pain in limb 02/10/2013  . Essential hypertension, benign 02/10/2013  . Cough 10/07/2012  . Hypothyroidism, postsurgical 09/08/2011  . Multinodular goiter (nontoxic) 07/08/2011  . Abdominal wall mass of left upper quadrant, 4 x 5 cm in anterior axillary line. 11/10/2010    Scot Jun, PTA  09/03/2015, 11:40 AM  [place] [address] [place], [place], [address] Phone: [phone]   Fax:  [phone]  Name: Yvonne Tran MRN: [id number] Date of Birth: [birth year]

## 2015-09-05 ENCOUNTER — Ambulatory Visit: Payer: PPO | Admitting: Physical Therapy

## 2015-09-05 ENCOUNTER — Encounter: Payer: Self-pay | Admitting: Physical Therapy

## 2015-09-05 DIAGNOSIS — R252 Cramp and spasm: Secondary | ICD-10-CM

## 2015-09-05 DIAGNOSIS — M542 Cervicalgia: Secondary | ICD-10-CM

## 2015-09-05 DIAGNOSIS — M25512 Pain in left shoulder: Secondary | ICD-10-CM

## 2015-09-05 DIAGNOSIS — M25612 Stiffness of left shoulder, not elsewhere classified: Secondary | ICD-10-CM

## 2015-09-05 NOTE — Therapy (Signed)
[address] [place], [place], [address] Phone: [phone]   Fax:  [phone]  Physical Therapy Treatment  Patient Details  Name: Yvonne Tran MRN: [id number] Date of Birth: [birth year] Referring Provider: Theadore Nan  Encounter Date: 09/05/2015      PT End of Session - 09/05/15 1219    Visit Number 7   Date for PT Re-Evaluation 09/16/15   PT Start Time 1145   PT Stop Time 1233   PT Time Calculation (min) 48 min   Activity Tolerance Patient tolerated treatment well   Behavior During Therapy [facility] for tasks assessed/performed      Past Medical History:  Diagnosis Date  . Cardiac angina ([place])   . Glaucoma    GLAUCOMA IN   BOTH EYES:  LEGALLY BLIND IN RIGHT EYE  . Goiter    THROID GOITER WITH COMPRESSION--PT HAVING TROUBLE SWALLOWING AND SOB  . Headache(784.0)    LEFT TEMPORAL AREA  . Heart attack ([place])    30 YRS AGO  DR VARANASI  EAGLE CARD  . Hyperlipidemia   . Hypertension   . Lipoma   . Pain    RIGHT THIGH FOR QUITE SOME TIME--LEG GIVES AWAY SOMETIMES.  LOWER BACK PAIN  - S/P HX OF PREVIOUS BACK SURGERY  . Peripheral vascular disease ([place])    09/2008 STENT LEFT EXTERNAL ILIAC;  03/20/10 RE-STENT OF LEFT EXTERNAL ILIAC STENT- AT [facility] WITH DR. VARANASI  . Recurrent upper respiratory infection (URI) MAY 2013   RECENT COLD   . Shortness of breath    WITH EXERTION   . Stroke [facility])    MINI STROKE  YRS AGO     Past Surgical History:  Procedure Laterality Date  . APPENDECTOMY    . BACK SURGERY  1988   PT STATES AFTER THE BACK SURGERY PT HAS PARALYSIS LEFT ARM AND LEG--TOOK 2&1/2 YEARS TO REGAIN USE OF LEFT SIDE--LEFT SIDE REMAINS WEAKER THAN HER RIFHT SIDE.  Marland Kitchen CARDIAC CATHETERIZATION  2012  . EYE SURGERY     left eye, lens replacement  . MASS EXCISION  01/13/2011   Procedure: EXCISION MASS;  Surgeon: Stark Klein, MD;  Location: [place];  Service: General;  Laterality: Left;  Left  abdominal wall mass (subcutaneous, 4-5 cm).  . THYROIDECTOMY  08/14/2011   Procedure: THYROIDECTOMY;  Surgeon: Earnstine Regal, MD;  [facility];  Service: General;  Laterality: N/A;  Total Thyroidectomy  . VASCULAR SURGERY     STENT PLACED 03/2010 RT LEG    There were no vitals filed for this visit.      Subjective Assessment - 09/05/15 1147    Subjective "Much better only a 4 today"   Currently in Pain? Yes   Pain Location Neck   Pain Orientation Left            [facility] PT Assessment - 09/05/15 0001      AROM   Overall AROM Comments AROM of the cervical spine decreased 50% with c/o tightness and pain in the neck   AROM Assessment Site Shoulder   Right/Left Shoulder Left   Left Shoulder Flexion 129 Degrees   Left Shoulder ABduction 90 Degrees   Left Shoulder Internal Rotation 65 Degrees   Left Shoulder External Rotation 43 Degrees                     [facility] Adult PT Treatment/Exercise - 09/05/15 0001  Moist Heat Therapy   Number Minutes Moist Heat 15 Minutes   Moist Heat Location Cervical     Electrical Stimulation   Electrical Stimulation Location cerv/trap/rhom   Electrical Stimulation Action IFC   Electrical Stimulation Goals Pain     Ultrasound   Ultrasound Location L cerv/trap   Ultrasound Parameters 1MHz 1.1w/cm2     Manual Therapy   Manual Therapy Soft tissue mobilization;Passive ROM;Manual Traction;Neural Stretch   Soft tissue mobilization occipital release, posterior para spinales, levator stretch   Passive ROM Cervical all directions   Manual Traction 3x10sec                   PT Short Term Goals - 07/23/15 0936      PT SHORT TERM GOAL #1   Title independent with initial HEP   Status Achieved           PT Long Term Goals - 08/08/15 1010      PT LONG TERM GOAL #1   Title decrease pain 50%   Status Not Met     PT LONG TERM GOAL #2   Title increase cervical ROM 25%   Baseline flex and ext limited 25% , RT rot  limited 50%, left rotation and SB limited 75%     PT LONG TERM GOAL #3   Title increase left shoulder AROM to 130 degrees flexion   Baseline weakness in bilateral shld noted   Status Achieved     PT LONG TERM GOAL #4   Title report no issue with doing her hair or with dressing   Status On-going               Plan - 09/05/15 1221    Clinical Impression Statement Pt reports decrease pain from previous treatment so interventions stayed the same. Does reports increase discomfort with contract relax with MT and levator stretch.   Rehab Potential Good   PT Frequency 2x / week   PT Duration 4 weeks   PT Next Visit Plan modalities to decrease pain to progress to more active treatment.      Patient will benefit from skilled therapeutic intervention in order to improve the following deficits and impairments:     Visit Diagnosis: Cervicalgia  Stiffness of left shoulder, not elsewhere classified  Cramp and spasm  Pain in left shoulder     Problem List Patient Active Problem List   Diagnosis Date Noted  . PAD (peripheral artery disease) ([place]) 02/14/2014  . Mixed hyperlipidemia 02/10/2013  . Pain in limb 02/10/2013  . Essential hypertension, benign 02/10/2013  . Cough 10/07/2012  . Hypothyroidism, postsurgical 09/08/2011  . Multinodular goiter (nontoxic) 07/08/2011  . Abdominal wall mass of left upper quadrant, 4 x 5 cm in anterior axillary line. 11/10/2010    Scot Jun 09/05/2015, 12:24 PM  [address] [address] [place], [place], [address] Phone: [phone]   Fax:  [phone]  Name: Yvonne Tran MRN: [id number] Date of Birth: 09/08/1934

## 2015-09-10 ENCOUNTER — Ambulatory Visit: Payer: PPO | Admitting: Physical Therapy

## 2015-09-12 ENCOUNTER — Ambulatory Visit: Payer: PPO | Admitting: Physical Therapy

## 2015-09-12 ENCOUNTER — Ambulatory Visit
Admission: RE | Admit: 2015-09-12 | Discharge: 2015-09-12 | Disposition: A | Discharge status: Home / Self Care | Payer: PPO | Source: Ambulatory Visit | Attending: Family Medicine | Admitting: Family Medicine

## 2015-09-12 DIAGNOSIS — R1030 Lower abdominal pain, unspecified: Secondary | ICD-10-CM

## 2015-09-12 DIAGNOSIS — K5730 Diverticulosis of large intestine without perforation or abscess without bleeding: Secondary | ICD-10-CM | POA: Diagnosis not present

## 2015-09-12 MED ORDER — IOHEXOL 300 MG/ML  SOLN
30.0000 mL | Freq: Once | INTRAMUSCULAR | Status: AC | PRN
  Administered 2015-09-12: 30 mL via ORAL

## 2015-09-12 MED ORDER — IOPAMIDOL (ISOVUE-300) INJECTION 61%
125.0000 mL | Freq: Once | INTRAVENOUS | Status: AC | PRN
  Administered 2015-09-12: 125 mL via INTRAVENOUS

## 2015-09-17 ENCOUNTER — Encounter: Payer: Self-pay | Admitting: Physical Therapy

## 2015-09-17 ENCOUNTER — Ambulatory Visit: Payer: PPO | Attending: Family Medicine | Admitting: Physical Therapy

## 2015-09-17 DIAGNOSIS — M25512 Pain in left shoulder: Secondary | ICD-10-CM | POA: Diagnosis not present

## 2015-09-17 DIAGNOSIS — M542 Cervicalgia: Secondary | ICD-10-CM | POA: Insufficient documentation

## 2015-09-17 DIAGNOSIS — M25612 Stiffness of left shoulder, not elsewhere classified: Secondary | ICD-10-CM | POA: Diagnosis not present

## 2015-09-17 DIAGNOSIS — R252 Cramp and spasm: Secondary | ICD-10-CM | POA: Insufficient documentation

## 2015-09-17 NOTE — Addendum Note (Signed)
Addended by: Sumner Boast on: 09/17/2015 04:31 PM   Modules accepted: Orders

## 2015-09-17 NOTE — Therapy (Signed)
[place] [address] [place], [place], [address] Phone: [phone]   Fax:  [phone]  Physical Therapy Treatment  Patient Details  Name: Yvonne Tran MRN: [id number] Date of Birth: [birth year] Referring Provider: Theadore Nan  Encounter Date: 09/17/2015      PT End of Session - 09/17/15 1100    Visit Number 8   Date for PT Re-Evaluation 09/16/15   PT Start Time 6546   PT Stop Time 1105   PT Time Calculation (min) 50 min   Activity Tolerance Patient tolerated treatment well   Behavior During Therapy [facility] for tasks assessed/performed      Past Medical History:  Diagnosis Date  . Cardiac angina ([place])   . Glaucoma    GLAUCOMA IN   BOTH EYES:  LEGALLY BLIND IN RIGHT EYE  . Goiter    THROID GOITER WITH COMPRESSION--PT HAVING TROUBLE SWALLOWING AND SOB  . Headache(784.0)    LEFT TEMPORAL AREA  . Heart attack ([place])    30 YRS AGO  DR VARANASI  EAGLE CARD  . Hyperlipidemia   . Hypertension   . Lipoma   . Pain    RIGHT THIGH FOR QUITE SOME TIME--LEG GIVES AWAY SOMETIMES.  LOWER BACK PAIN  - S/P HX OF PREVIOUS BACK SURGERY  . Peripheral vascular disease ([place])    09/2008 STENT LEFT EXTERNAL ILIAC;  03/20/10 RE-STENT OF LEFT EXTERNAL ILIAC STENT- AT [facility] WITH DR. VARANASI  . Recurrent upper respiratory infection (URI) MAY 2013   RECENT COLD   . Shortness of breath    WITH EXERTION   . Stroke [facility])    MINI STROKE  YRS AGO     Past Surgical History:  Procedure Laterality Date  . APPENDECTOMY    . BACK SURGERY  1988   PT STATES AFTER THE BACK SURGERY PT HAS PARALYSIS LEFT ARM AND LEG--TOOK 2&1/2 YEARS TO REGAIN USE OF LEFT SIDE--LEFT SIDE REMAINS WEAKER THAN HER RIFHT SIDE.  Marland Kitchen CARDIAC CATHETERIZATION  2012  . EYE SURGERY     left eye, lens replacement  . MASS EXCISION  01/13/2011   Procedure: EXCISION MASS;  Surgeon: Stark Klein, MD;  Location: [place];  Service: General;  Laterality: Left;  Left  abdominal wall mass (subcutaneous, 4-5 cm).  . THYROIDECTOMY  08/14/2011   Procedure: THYROIDECTOMY;  Surgeon: Earnstine Regal, MD;  [facility];  Service: General;  Laterality: N/A;  Total Thyroidectomy  . VASCULAR SURGERY     STENT PLACED 03/2010 RT LEG    There were no vitals filed for this visit.      Subjective Assessment - 09/17/15 1016    Subjective "A little bit better, I have to take an Aleve  this morning, the weather bothered it"   Currently in Pain? Yes   Pain Score 3    Pain Location Neck   Pain Orientation Left                         [facility] Adult PT Treatment/Exercise - 09/17/15 0001      Neck Exercises: Machines for Strengthening   UBE (Upper Arm Bike) L1 34fd/3rev     Neck Exercises: Theraband   Rows 15 reps;Green   Shoulder ADduction 10 reps  1lb   Shoulder External Rotation 10 reps  yellow   Other Theraband Exercises 4lb shrugs 2x10     Moist Heat Therapy   Number Minutes Moist Heat  15 Minutes   Moist Heat Location Cervical     Electrical Stimulation   Electrical Stimulation Location cerv/trap/rhom   Electrical Stimulation Action IFC   Electrical Stimulation Goals Pain     Ultrasound   Ultrasound Location L cerv/trap   Ultrasound Parameters 1Mz 1.2 w/cm2                  PT Short Term Goals - 07/23/15 0936      PT SHORT TERM GOAL #1   Title independent with initial HEP   Status Achieved           PT Long Term Goals - 09/17/15 1106      PT LONG TERM GOAL #1   Title decrease pain 50%   Status On-going     PT LONG TERM GOAL #2   Title increase cervical ROM 25%   Status Partially Met     PT LONG TERM GOAL #3   Title increase left shoulder AROM to 130 degrees flexion   Status Achieved     PT LONG TERM GOAL #4   Title report no issue with doing her hair or with dressing   Status Partially Met               Plan - 09/17/15 1103    Clinical Impression Statement Introduced pt to a more active  treatment this day. During exercises pt reports soreness in UE but able to complete interventions. After the interventions pt reported thst she was exhausted. Continued with [place] and e-Stim due to pt positive response.   Rehab Potential Good   PT Frequency 2x / week   PT Duration 4 weeks   PT Treatment/Interventions ADLs/Self Care Home Management;Cryotherapy;Electrical Stimulation;Moist Heat;Therapeutic exercise;Iontophoresis 62m/ml Dexamethasone;Therapeutic activities;Ultrasound;Neuromuscular re-education;Patient/family education;Manual techniques;Passive range of motion   PT Next Visit Plan slowly progress with exercises.      Patient will benefit from skilled therapeutic intervention in order to improve the following deficits and impairments:  Decreased range of motion, Decreased strength, Increased muscle spasms, Increased fascial restricitons, Impaired UE functional use, Impaired vision/preception, Improper body mechanics, Postural dysfunction, Pain  Visit Diagnosis: Cervicalgia  Stiffness of left shoulder, not elsewhere classified  Cramp and spasm  Pain in left shoulder     Problem List Patient Active Problem List   Diagnosis Date Noted  . PAD (peripheral artery disease) ([place] 02/14/2014  . Mixed hyperlipidemia 02/10/2013  . Pain in limb 02/10/2013  . Essential hypertension, benign 02/10/2013  . Cough 10/07/2012  . Hypothyroidism, postsurgical 09/08/2011  . Multinodular goiter (nontoxic) 07/08/2011  . Abdominal wall mass of left upper quadrant, 4 x 5 cm in anterior axillary line. 11/10/2010    RScot Jun PTA  09/17/2015, 11:11 AM  [place] [address]Phone: [phone]  Fax:  [phone] Name: Yvonne TESARMRN: [id number] of Birth: 105-29-1936

## 2015-09-19 ENCOUNTER — Other Ambulatory Visit: Payer: Self-pay | Admitting: Family Medicine

## 2015-09-19 DIAGNOSIS — R161 Splenomegaly, not elsewhere classified: Secondary | ICD-10-CM

## 2015-09-24 ENCOUNTER — Ambulatory Visit: Payer: PPO | Admitting: Physical Therapy

## 2015-09-24 ENCOUNTER — Encounter: Payer: Self-pay | Admitting: Physical Therapy

## 2015-09-24 DIAGNOSIS — M542 Cervicalgia: Secondary | ICD-10-CM

## 2015-09-24 DIAGNOSIS — M25512 Pain in left shoulder: Secondary | ICD-10-CM

## 2015-09-24 DIAGNOSIS — R252 Cramp and spasm: Secondary | ICD-10-CM

## 2015-09-24 DIAGNOSIS — M25612 Stiffness of left shoulder, not elsewhere classified: Secondary | ICD-10-CM

## 2015-09-24 NOTE — Therapy (Signed)
[place], [place], [address] Phone: [phone]   Fax:  [phone]  Physical Therapy Treatment  Patient Details  Name: Yvonne Tran MRN: [id number] Date of Birth: 1934/03/25 Referring Provider: Theadore Nan  Encounter Date: 09/24/2015      PT End of Session - 09/24/15 1039    Visit Number 9   Date for PT Re-Evaluation 10/16/15   PT Start Time 0956   PT Stop Time 1050   PT Time Calculation (min) 54 min   Activity Tolerance Patient tolerated treatment well   Behavior During Therapy [facility] for tasks assessed/performed      Past Medical History:  Diagnosis Date  . Cardiac angina ([place])   . Glaucoma    GLAUCOMA IN   BOTH EYES:  LEGALLY BLIND IN RIGHT EYE  . Goiter    THROID GOITER WITH COMPRESSION--PT HAVING TROUBLE SWALLOWING AND SOB  . Headache(784.0)    LEFT TEMPORAL AREA  . Heart attack ([place])    30 YRS AGO  DR VARANASI  EAGLE CARD  . Hyperlipidemia   . Hypertension   . Lipoma   . Pain    RIGHT THIGH FOR QUITE SOME TIME--LEG GIVES AWAY SOMETIMES.  LOWER BACK PAIN  - S/P HX OF PREVIOUS BACK SURGERY  . Peripheral vascular disease ([place])    09/2008 STENT LEFT EXTERNAL ILIAC;  03/20/10 RE-STENT OF LEFT EXTERNAL ILIAC STENT- AT [facility] WITH DR. VARANASI  . Recurrent upper respiratory infection (URI) MAY 2013   RECENT COLD   . Shortness of breath    WITH EXERTION   . Stroke [facility])    MINI STROKE  YRS AGO     Past Surgical History:  Procedure Laterality Date  . APPENDECTOMY    . BACK SURGERY  1988   PT STATES AFTER THE BACK SURGERY PT HAS PARALYSIS LEFT ARM AND LEG--TOOK 2&1/2 YEARS TO REGAIN USE OF LEFT SIDE--LEFT SIDE REMAINS WEAKER THAN HER RIFHT SIDE.  Marland Kitchen CARDIAC CATHETERIZATION  2012  . EYE SURGERY     left eye, lens replacement  . MASS EXCISION  01/13/2011   Procedure: EXCISION MASS;  Surgeon: Stark Klein, MD;  Location: [place];  Service: General;  Laterality: Left;  Left  abdominal wall mass (subcutaneous, 4-5 cm).  . THYROIDECTOMY  08/14/2011   Procedure: THYROIDECTOMY;  Surgeon: Earnstine Regal, MD;  [facility];  Service: General;  Laterality: N/A;  Total Thyroidectomy  . VASCULAR SURGERY     STENT PLACED 03/2010 RT LEG    There were no vitals filed for this visit.      Subjective Assessment - 09/24/15 0958    Subjective "A little less pain over there"   Currently in Pain? Yes   Pain Score 2    Pain Location Neck   Pain Orientation Left            [facility] PT Assessment - 09/24/15 0001      AROM   Overall AROM Comments AROM of the cervical spine decreased 25%    AROM Assessment Site Shoulder   Right/Left Shoulder Left   Left Shoulder Flexion 135 Degrees   Left Shoulder ABduction 99 Degrees                     [facility] Adult PT Treatment/Exercise - 09/24/15 0001      Neck Exercises: Machines for Strengthening   UBE (Upper Arm Bike) L1 60frd/3rev   Other Machines  for Strengthening Seated Rows & lats 20 lb 2x10     Neck Exercises: Theraband   Rows 15 reps;Green   Shoulder ADduction 10 reps   Shoulder ADduction Limitations 1   Other Theraband Exercises 4lb shrugs 2x10   Other Theraband Exercises Shoulder flex 1lb x10     Electrical Stimulation   Electrical Stimulation Location cerv/trap/rhom   Electrical Stimulation Action IFC   Electrical Stimulation Parameters to pt tolerance    Electrical Stimulation Goals Pain                  PT Short Term Goals - 07/23/15 0936      PT SHORT TERM GOAL #1   Title independent with initial HEP   Status Achieved           PT Long Term Goals - 09/24/15 1010      PT LONG TERM GOAL #1   Title decrease pain 50%   Status On-going     PT LONG TERM GOAL #2   Title increase cervical ROM 25%   Status Achieved               Plan - 09/24/15 1039    Clinical Impression Statement Pt has shown a little progression and has also completed some LTG's. Pt UE fatigues  quickly with today interventions. Increase time needed due to pt have to stop between reps due to muscle soreness.    Rehab Potential Good   PT Frequency 2x / week   PT Duration 4 weeks   PT Next Visit Plan slowly progress with exercises. IASTM and KT tape.       Patient will benefit from skilled therapeutic intervention in order to improve the following deficits and impairments:  Decreased range of motion, Decreased strength, Increased muscle spasms, Increased fascial restricitons, Impaired UE functional use, Impaired vision/preception, Improper body mechanics, Postural dysfunction, Pain  Visit Diagnosis: Cervicalgia  Stiffness of left shoulder, not elsewhere classified  Cramp and spasm  Pain in left shoulder     Problem List Patient Active Problem List   Diagnosis Date Noted  . PAD (peripheral artery disease) ([place]) 02/14/2014  . Mixed hyperlipidemia 02/10/2013  . Pain in limb 02/10/2013  . Essential hypertension, benign 02/10/2013  . Cough 10/07/2012  . Hypothyroidism, postsurgical 09/08/2011  . Multinodular goiter (nontoxic) 07/08/2011  . Abdominal wall mass of left upper quadrant, 4 x 5 cm in anterior axillary line. 11/10/2010    Scot Jun 09/24/2015, 10:42 AM  [place] [address] [place], [place], [address] Phone: [phone]   Fax:  [phone]  Name: Yvonne Tran MRN: [id number] Date of Birth: [birth year]

## 2015-09-26 ENCOUNTER — Encounter: Payer: Self-pay | Admitting: Physical Therapy

## 2015-09-26 ENCOUNTER — Ambulatory Visit: Payer: PPO | Admitting: Physical Therapy

## 2015-09-26 DIAGNOSIS — M25612 Stiffness of left shoulder, not elsewhere classified: Secondary | ICD-10-CM

## 2015-09-26 DIAGNOSIS — M542 Cervicalgia: Secondary | ICD-10-CM

## 2015-09-26 DIAGNOSIS — M25512 Pain in left shoulder: Secondary | ICD-10-CM

## 2015-09-26 DIAGNOSIS — R252 Cramp and spasm: Secondary | ICD-10-CM

## 2015-09-26 NOTE — Therapy (Signed)
[place], [place], [address] Phone: [phone]   Fax:  [phone]  Physical Therapy Treatment  Patient Details  Name: Yvonne Tran MRN: [id number] Date of Birth: 03-29-1934 Referring Provider: Theadore Nan  Encounter Date: 09/26/2015      PT End of Session - 09/26/15 1419    Visit Number 10   Date for PT Re-Evaluation 10/16/15   PT Start Time 1345   PT Stop Time 1433   PT Time Calculation (min) 48 min   Activity Tolerance Patient tolerated treatment well   Behavior During Therapy [facility] for tasks assessed/performed      Past Medical History:  Diagnosis Date  . Cardiac angina ([place])   . Glaucoma    GLAUCOMA IN   BOTH EYES:  LEGALLY BLIND IN RIGHT EYE  . Goiter    THROID GOITER WITH COMPRESSION--PT HAVING TROUBLE SWALLOWING AND SOB  . Headache(784.0)    LEFT TEMPORAL AREA  . Heart attack ([place])    30 YRS AGO  DR VARANASI  EAGLE CARD  . Hyperlipidemia   . Hypertension   . Lipoma   . Pain    RIGHT THIGH FOR QUITE SOME TIME--LEG GIVES AWAY SOMETIMES.  LOWER BACK PAIN  - S/P HX OF PREVIOUS BACK SURGERY  . Peripheral vascular disease ([place])    09/2008 STENT LEFT EXTERNAL ILIAC;  03/20/10 RE-STENT OF LEFT EXTERNAL ILIAC STENT- AT [facility] WITH DR. VARANASI  . Recurrent upper respiratory infection (URI) MAY 2013   RECENT COLD   . Shortness of breath    WITH EXERTION   . Stroke [facility])    MINI STROKE  YRS AGO     Past Surgical History:  Procedure Laterality Date  . APPENDECTOMY    . BACK SURGERY  1988   PT STATES AFTER THE BACK SURGERY PT HAS PARALYSIS LEFT ARM AND LEG--TOOK 2&1/2 YEARS TO REGAIN USE OF LEFT SIDE--LEFT SIDE REMAINS WEAKER THAN HER RIFHT SIDE.  Marland Kitchen CARDIAC CATHETERIZATION  2012  . EYE SURGERY     left eye, lens replacement  . MASS EXCISION  01/13/2011   Procedure: EXCISION MASS;  Surgeon: Stark Klein, MD;  Location: [place];  Service: General;  Laterality: Left;  Left  abdominal wall mass (subcutaneous, 4-5 cm).  . THYROIDECTOMY  08/14/2011   Procedure: THYROIDECTOMY;  Surgeon: Earnstine Regal, MD;  [facility];  Service: General;  Laterality: N/A;  Total Thyroidectomy  . VASCULAR SURGERY     STENT PLACED 03/2010 RT LEG    There were no vitals filed for this visit.      Subjective Assessment - 09/26/15 1348    Subjective "Not too bad actually"   Currently in Pain? Yes   Pain Score 3    Pain Location Neck   Pain Orientation Left            [facility] PT Assessment - 09/26/15 0001      AROM   Overall AROM Comments AROM of the cervical spine decreased 25%                      [facility] Adult PT Treatment/Exercise - 09/26/15 0001      Neck Exercises: Machines for Strengthening   UBE (Upper Arm Bike) L2 31frd/3rev   Other Machines for Strengthening Seated Rows & lats 20 lb 2x10     Neck Exercises: Theraband   Rows 15 reps;Green  x2   Other Theraband  Exercises 4lb shrugs 2x10     Neck Exercises: Standing   Neck Retraction 20 reps     Moist Heat Therapy   Number Minutes Moist Heat 15 Minutes   Moist Heat Location Cervical     Electrical Stimulation   Electrical Stimulation Location cerv/trap/rhom   Electrical Stimulation Action IFC   Electrical Stimulation Parameters to pt tolerance   Electrical Stimulation Goals Pain                  PT Short Term Goals - 07/23/15 0936      PT SHORT TERM GOAL #1   Title independent with initial HEP   Status Achieved           PT Long Term Goals - 09/24/15 1010      PT LONG TERM GOAL #1   Title decrease pain 50%   Status On-going     PT LONG TERM GOAL #2   Title increase cervical ROM 25%   Status Achieved               Plan - 09/26/15 1420    Clinical Impression Statement Pt with reports of pain from fatigue with today's interventions. Pt also reports some L bicep fatigue with seated lats pulls. Pt reports that she feels 10% better overall.   Rehab  Potential Good   PT Frequency 2x / week   PT Duration 4 weeks   PT Treatment/Interventions ADLs/Self Care Home Management;Cryotherapy;Electrical Stimulation;Moist Heat;Therapeutic exercise;Iontophoresis 4mg /ml Dexamethasone;Therapeutic activities;Ultrasound;Neuromuscular re-education;Patient/family education;Manual techniques;Passive range of motion   PT Next Visit Plan slowly progress with exercises.       Patient will benefit from skilled therapeutic intervention in order to improve the following deficits and impairments:  Decreased range of motion, Decreased strength, Increased muscle spasms, Increased fascial restricitons, Impaired UE functional use, Impaired vision/preception, Improper body mechanics, Postural dysfunction, Pain  Visit Diagnosis: Stiffness of left shoulder, not elsewhere classified  Cramp and spasm  Pain in left shoulder  Cervicalgia     Problem List Patient Active Problem List   Diagnosis Date Noted  . PAD (peripheral artery disease) ([place]) 02/14/2014  . Mixed hyperlipidemia 02/10/2013  . Pain in limb 02/10/2013  . Essential hypertension, benign 02/10/2013  . Cough 10/07/2012  . Hypothyroidism, postsurgical 09/08/2011  . Multinodular goiter (nontoxic) 07/08/2011  . Abdominal wall mass of left upper quadrant, 4 x 5 cm in anterior axillary line. 11/10/2010    Scot Jun 09/26/2015, 2:22 PM  [place], [place], [address] Phone: [phone]   Fax:  [phone]  Name: Yvonne Tran MRN: [id number] Date of Birth: Nov 08, 1934

## 2015-09-27 DIAGNOSIS — Z961 Presence of intraocular lens: Secondary | ICD-10-CM | POA: Diagnosis not present

## 2015-09-27 DIAGNOSIS — H401133 Primary open-angle glaucoma, bilateral, severe stage: Secondary | ICD-10-CM | POA: Diagnosis not present

## 2015-10-01 ENCOUNTER — Ambulatory Visit
Admission: RE | Admit: 2015-10-01 | Discharge: 2015-10-01 | Disposition: A | Discharge status: Home / Self Care | Payer: PPO | Source: Ambulatory Visit | Attending: Family Medicine | Admitting: Family Medicine

## 2015-10-01 DIAGNOSIS — R161 Splenomegaly, not elsewhere classified: Secondary | ICD-10-CM

## 2015-10-03 ENCOUNTER — Encounter: Payer: Self-pay | Admitting: Physical Therapy

## 2015-10-03 ENCOUNTER — Ambulatory Visit: Payer: PPO | Admitting: Physical Therapy

## 2015-10-03 DIAGNOSIS — M25612 Stiffness of left shoulder, not elsewhere classified: Secondary | ICD-10-CM

## 2015-10-03 DIAGNOSIS — M25512 Pain in left shoulder: Secondary | ICD-10-CM

## 2015-10-03 DIAGNOSIS — M542 Cervicalgia: Secondary | ICD-10-CM

## 2015-10-03 DIAGNOSIS — R252 Cramp and spasm: Secondary | ICD-10-CM

## 2015-10-03 NOTE — Therapy (Addendum)
[facility] [place], [place], [address] Phone: [phone]   Fax:  [phone]  Physical Therapy Treatment  Patient Details  Name: Yvonne Tran MRN: [id number] Date of Birth: [birth year] Referring Provider: Theadore Nan  Encounter Date: 10/03/2015      PT End of Session - 10/03/15 1340    Visit Number 11   Date for PT Re-Evaluation 10/16/15   PT Start Time 1300   PT Stop Time 1354   PT Time Calculation (min) 54 min   Activity Tolerance Patient tolerated treatment well   Behavior During Therapy [facility] for tasks assessed/performed      Past Medical History:  Diagnosis Date  . Cardiac angina ([place])   . Glaucoma    GLAUCOMA IN   BOTH EYES:  LEGALLY BLIND IN RIGHT EYE  . Goiter    THROID GOITER WITH COMPRESSION--PT HAVING TROUBLE SWALLOWING AND SOB  . Headache(784.0)    LEFT TEMPORAL AREA  . Heart attack ([place])    30 YRS AGO  DR VARANASI  EAGLE CARD  . Hyperlipidemia   . Hypertension   . Lipoma   . Pain    RIGHT THIGH FOR QUITE SOME TIME--LEG GIVES AWAY SOMETIMES.  LOWER BACK PAIN  - S/P HX OF PREVIOUS BACK SURGERY  . Peripheral vascular disease ([place])    09/2008 STENT LEFT EXTERNAL ILIAC;  03/20/10 RE-STENT OF LEFT EXTERNAL ILIAC STENT- AT [facility] WITH DR. VARANASI  . Recurrent upper respiratory infection (URI) MAY 2013   RECENT COLD   . Shortness of breath    WITH EXERTION   . Stroke [facility])    MINI STROKE  YRS AGO     Past Surgical History:  Procedure Laterality Date  . APPENDECTOMY    . BACK SURGERY  1988   PT STATES AFTER THE BACK SURGERY PT HAS PARALYSIS LEFT ARM AND LEG--TOOK 2&1/2 YEARS TO REGAIN USE OF LEFT SIDE--LEFT SIDE REMAINS WEAKER THAN HER RIFHT SIDE.  Marland Kitchen CARDIAC CATHETERIZATION  2012  . EYE SURGERY     left eye, lens replacement  . MASS EXCISION  01/13/2011   Procedure: EXCISION MASS;  Surgeon: Stark Klein, MD;  Location: [facility];  Service: General;  Laterality: Left;  Left  abdominal wall mass (subcutaneous, 4-5 cm).  . THYROIDECTOMY  08/14/2011   Procedure: THYROIDECTOMY;  Surgeon: Earnstine Regal, MD;  [facility];  Service: General;  Laterality: N/A;  Total Thyroidectomy  . VASCULAR SURGERY     STENT PLACED 03/2010 RT LEG    There were no vitals filed for this visit.      Subjective Assessment - 10/03/15 1301    Subjective "not bad"   Currently in Pain? Yes   Pain Score 3    Pain Location Neck   Pain Orientation Left            [facility] PT Assessment - 10/03/15 0001      AROM   Overall AROM Comments Cervical spine AROM WFL   Right/Left Shoulder Left   Left Shoulder Flexion 160 Degrees   Left Shoulder ABduction 156 Degrees                     [facility] Adult PT Treatment/Exercise - 10/03/15 0001      Neck Exercises: Machines for Strengthening   UBE (Upper Arm Bike) L2 25fd/3rev   Other Machines for Strengthening Seated Rows & lats 20 lb 2x10     Neck  Exercises: Theraband   Other Theraband Exercises 4lb shrugs 2x10   Other Theraband Exercises Shoulder flex 1lb 2x10     Neck Exercises: Standing   Neck Retraction 20 reps     Electrical Stimulation   Electrical Stimulation Location cerv/trap/rhom   Electrical Stimulation Action IFC   Electrical Stimulation Parameters to tp tolerance   Electrical Stimulation Goals Pain     Manual Therapy   Manual Therapy Neural Stretch;Passive ROM   Neural Stretch Median nerve stretch LUE                  PT Short Term Goals - 07/23/15 0936      PT SHORT TERM GOAL #1   Title independent with initial HEP   Status Achieved           PT Long Term Goals - 09/24/15 1010      PT LONG TERM GOAL #1   Title decrease pain 50%   Status On-going     PT LONG TERM GOAL #2   Title increase cervical ROM 25%   Status Achieved               Plan - 10/03/15 1340    Clinical Impression Statement Pt has progressed improving her cervical and L shoulder ROM. Reports that she  is doing better overall but pt continues to have increase L shoulder pain with activity after the muscles have been worked. Pt reports that she could feel pain going up her arm   Rehab Potential Good   PT Frequency 2x / week   PT Duration 4 weeks   PT Treatment/Interventions ADLs/Self Care Home Management;Cryotherapy;Electrical Stimulation;Moist Heat;Therapeutic exercise;Iontophoresis 53m/ml Dexamethasone;Therapeutic activities;Ultrasound;Neuromuscular re-education;Patient/family education;Manual techniques;Passive range of motion   PT Next Visit Plan Continue nerve glide to RUE, progress with exercise as tolerated, could try [place]      Patient will benefit from skilled therapeutic intervention in order to improve the following deficits and impairments:  Decreased range of motion, Decreased strength, Increased muscle spasms, Increased fascial restricitons, Impaired UE functional use, Impaired vision/preception, Improper body mechanics, Postural dysfunction, Pain  Visit Diagnosis: Stiffness of left shoulder, not elsewhere classified  Cramp and spasm  Pain in left shoulder  Cervicalgia     Problem List Patient Active Problem List   Diagnosis Date Noted  . PAD (peripheral artery disease) ([place] 02/14/2014  . Mixed hyperlipidemia 02/10/2013  . Pain in limb 02/10/2013  . Essential hypertension, benign 02/10/2013  . Cough 10/07/2012  . Hypothyroidism, postsurgical 09/08/2011  . Multinodular goiter (nontoxic) 07/08/2011  . Abdominal wall mass of left upper quadrant, 4 x 5 cm in anterior axillary line. 11/10/2010    PHYSICAL THERAPY DISCHARGE SUMMARY  Visits from Start of Care: 11 Plan: Patient agrees to discharge.  Patient goals were partially met. Patient is being discharged due to being pleased with the current functional level.  ?????    RScot Jun9/28/2017, 1:43 PM  [address] [address]Phone: [phone]  Fax:  [phone] Name: AISABELLAROSE KOPEMRN: [id number] of Birth: 109-May-1936

## 2015-10-08 ENCOUNTER — Ambulatory Visit: Payer: PPO | Admitting: Physical Therapy

## 2015-10-10 ENCOUNTER — Ambulatory Visit: Payer: PPO | Attending: Family Medicine | Admitting: Physical Therapy

## 2015-11-15 ENCOUNTER — Telehealth: Payer: Self-pay | Admitting: Cardiovascular Disease

## 2015-11-15 NOTE — Telephone Encounter (Signed)
Pt need an appointment with Dr Fletcher Anon please.

## 2016-01-27 DIAGNOSIS — Z961 Presence of intraocular lens: Secondary | ICD-10-CM | POA: Diagnosis not present

## 2016-01-27 DIAGNOSIS — H401133 Primary open-angle glaucoma, bilateral, severe stage: Secondary | ICD-10-CM | POA: Diagnosis not present

## 2016-02-24 DIAGNOSIS — S92511A Displaced fracture of proximal phalanx of right lesser toe(s), initial encounter for closed fracture: Secondary | ICD-10-CM | POA: Diagnosis not present

## 2016-02-24 DIAGNOSIS — S90121A Contusion of right lesser toe(s) without damage to nail, initial encounter: Secondary | ICD-10-CM | POA: Diagnosis not present

## 2016-03-10 ENCOUNTER — Encounter: Payer: Self-pay | Admitting: Interventional Cardiology

## 2016-03-17 ENCOUNTER — Ambulatory Visit: Payer: PPO | Admitting: Interventional Cardiology

## 2016-03-24 ENCOUNTER — Ambulatory Visit (INDEPENDENT_AMBULATORY_CARE_PROVIDER_SITE_OTHER): Payer: PPO | Admitting: Cardiovascular Disease

## 2016-03-24 ENCOUNTER — Encounter: Payer: Self-pay | Admitting: Cardiovascular Disease

## 2016-03-24 VITALS — BP 121/67 | HR 77 | Ht 65.0 in | Wt 211.2 lb

## 2016-03-24 DIAGNOSIS — I739 Peripheral vascular disease, unspecified: Secondary | ICD-10-CM

## 2016-03-24 DIAGNOSIS — M549 Dorsalgia, unspecified: Secondary | ICD-10-CM

## 2016-03-24 DIAGNOSIS — E785 Hyperlipidemia, unspecified: Secondary | ICD-10-CM | POA: Diagnosis not present

## 2016-03-24 NOTE — Progress Notes (Signed)
Cardiology Office Note   Date:  03/24/2016   ID:  Yvonne Tran, Yvonne Tran January 07, 1934, MRN [id number]  PCP:  Cari Caraway, MD  Cardiologist:  Dr. Irish Lack  Chief Complaint  Patient presents with  . Follow-up      History of Present Illness: Yvonne Tran is a 82 y.o. female who is here for a follow-up visit regarding peripheral arterial disease. She had previous left iliac stenting twice most recently in 2012 for in-stent restenosis. She has known history of recurrent chest pain with no evidence of significant coronary artery disease on previous cardiac catheterization in 2009 and 2012. She is not diabetic and does not smoke. She does have known history of hypertension. She reports having back surgery done in 1986 after a spine fracture. She had very slow recovery at that time and never regained her functional capacity that she had before the event. She continues to complain of lower back pain with stiffness. She has been using a cane for a long time. She is only to walk a short distance. She reports that even when she had her left iliac artery stenting done in the past, she did not notice significant symptomatic improvement.  She has been stable overall but she reports worsening mid and lower back pain. She has mild aching in both legs when she walks. No chest pain or shortness of breath. Back pain worsens movement.   Past Medical History:  Diagnosis Date  . Cardiac angina ([place])   . Glaucoma    GLAUCOMA IN   BOTH EYES:  LEGALLY BLIND IN RIGHT EYE  . Goiter    THROID GOITER WITH COMPRESSION--PT HAVING TROUBLE SWALLOWING AND SOB  . Headache(784.0)    LEFT TEMPORAL AREA  . Heart attack    30 YRS AGO  DR VARANASI  EAGLE CARD  . Hyperlipidemia   . Hypertension   . Lipoma   . Pain    RIGHT THIGH FOR QUITE SOME TIME--LEG GIVES AWAY SOMETIMES.  LOWER BACK PAIN  - S/P HX OF PREVIOUS BACK SURGERY  . Peripheral vascular disease ([place])    09/2008 STENT LEFT EXTERNAL  ILIAC;  03/20/10 RE-STENT OF LEFT EXTERNAL ILIAC STENT- AT [facility] WITH DR. VARANASI  . Recurrent upper respiratory infection (URI) MAY 2013   RECENT COLD   . Shortness of breath    WITH EXERTION   . Stroke [facility])    MINI STROKE  YRS AGO     Past Surgical History:  Procedure Laterality Date  . APPENDECTOMY    . BACK SURGERY  1988   PT STATES AFTER THE BACK SURGERY PT HAS PARALYSIS LEFT ARM AND LEG--TOOK 2&1/2 YEARS TO REGAIN USE OF LEFT SIDE--LEFT SIDE REMAINS WEAKER THAN HER RIFHT SIDE.  Marland Kitchen CARDIAC CATHETERIZATION  2012  . EYE SURGERY     left eye, lens replacement  . MASS EXCISION  01/13/2011   Procedure: EXCISION MASS;  Surgeon: Stark Klein, MD;  Location: [facility];  Service: General;  Laterality: Left;  Left abdominal wall mass (subcutaneous, 4-5 cm).  . THYROIDECTOMY  08/14/2011   Procedure: THYROIDECTOMY;  Surgeon: Earnstine Regal, MD;  [facility];  Service: General;  Laterality: N/A;  Total Thyroidectomy  . VASCULAR SURGERY     STENT PLACED 03/2010 RT LEG     Current Outpatient Prescriptions  Medication Sig Dispense Refill  . amLODipine (NORVASC) 10 MG tablet Take 10 mg by mouth daily.    Marland Kitchen aspirin EC 81 MG tablet Take 81 mg by  mouth every morning.     Marland Kitchen azithromycin (ZITHROMAX Z-PAK) 250 MG tablet 2 po day one, then 1 daily x 4 days 5 tablet 0  . brimonidine (ALPHAGAN) 0.15 % ophthalmic solution Place 1 drop into the left eye 2 (two) times daily.    . dorzolamide-timolol (COSOPT) 22.3-6.8 MG/ML ophthalmic solution Place 1 drop into both eyes 2 (two) times daily.     . hydrochlorothiazide (MICROZIDE) 12.5 MG capsule Take 12.5 mg by mouth daily as needed. For excess water.    . latanoprost (XALATAN) 0.005 % ophthalmic solution Place 1 drop into both eyes at bedtime.    Marland Kitchen levothyroxine (SYNTHROID, LEVOTHROID) 88 MCG tablet Take 88 mcg by mouth daily before breakfast.    . nitroGLYCERIN (NITROSTAT) 0.4 MG SL tablet Place 0.4 mg under the tongue every 5 (five) minutes as needed. For  chest pain.    . prednisoLONE acetate (PRED FORTE) 1 % ophthalmic suspension Place 1 drop into both eyes 4 (four) times daily.     No current facility-administered medications for this visit.     Allergies:   Crestor [rosuvastatin]; Eggs or egg-derived products; Lemon juice; Milk-related compounds; and Shellfish allergy    Social History:  The patient  reports that she has never smoked. She has never used smokeless tobacco. She reports that she does not drink alcohol or use drugs.   Family History:  The patient's family history includes Cancer in her brother and mother; Heart attack in her father; Heart disease in her father; Hypertension in her father; Stroke in her mother.    ROS:  Please see the history of present illness.   Otherwise, review of systems are positive for none.   All other systems are reviewed and negative.    PHYSICAL EXAM: VS:  BP 121/67   Pulse 77   Ht 5\' 5"  (1.651 m)   Wt 211 lb 3.2 oz (95.8 kg)   BMI 35.15 kg/m  , BMI Body mass index is 35.15 kg/m. GEN: Well nourished, well developed, in no acute distress  HEENT: normal  Neck: no JVD, carotid bruits, or masses Cardiac: RRR; no murmurs, rubs, or gallops,no edema  Respiratory:  clear to auscultation bilaterally, normal work of breathing GI: soft, nontender, nondistended, + BS MS: no deformity or atrophy  Skin: warm and dry, no rash Neuro:  Strength and sensation are intact Psych: euthymic mood, full affect Vascular: Femoral pulses are +1 bilaterally. Distal pulses are palpable.  EKG:  EKG is not ordered today.    Recent Labs: No results found for requested labs within last 8760 hours.    Lipid Panel    Component Value Date/Time   CHOL 198 05/15/2014 0947   TRIG 68.0 05/15/2014 0947   HDL 47.00 05/15/2014 0947   CHOLHDL 4 05/15/2014 0947   VLDL 13.6 05/15/2014 0947   LDLCALC 137 (H) 05/15/2014 0947   LDLDIRECT 196.5 12/12/2012 1104      Wt Readings from Last 3 Encounters:  03/24/16 211 lb  3.2 oz (95.8 kg)  03/19/15 225 lb 9.6 oz (102.3 kg)  03/18/15 219 lb 12.8 oz (99.7 kg)         ASSESSMENT AND PLAN:  1.  Peripheral arterial disease: She has known history of left external iliac artery stenting in the past. She is known to have moderate iliac disease but ABI has been relatively normal. I'm going to repeat aortoiliac duplex and ABI. Her symptoms are out of proportion to her vascular findings. I suspect that her  previous back surgery might be contributing to some of her symptoms. I'm referring her to a spine specialist for evaluation.  2. Hyperlipidemia: Unfortunately, she is intolerant to statins.  3. Essential hypertension blood pressure is reasonably controlled on current medications.   Disposition:   FU with me in 1 year  Signed,  Kathlyn Sacramento, MD  03/24/2016 11:19 AM    [place]

## 2016-03-24 NOTE — Patient Instructions (Signed)
Medication Instructions:  Continue current medications  Labwork: None Ordered  Testing/Procedures: Your physician has requested that you have an ankle brachial index (ABI). During this test an ultrasound and blood pressure cuff are used to evaluate the arteries that supply the arms and legs with blood. Allow thirty minutes for this exam. There are no restrictions or special instructions.   Follow-Up:  You have been referred to [facility]  Your physician wants you to follow-up in: 1 Year. You will receive a reminder letter in the mail two months in advance. If you don't receive a letter, please call our office to schedule the follow-up appointment.   Any Other Special Instructions Will Be Listed Below (If Applicable).   If you need a refill on your cardiac medications before your next appointment, please call your pharmacy.

## 2016-04-10 ENCOUNTER — Telehealth: Payer: Self-pay

## 2016-04-10 NOTE — Telephone Encounter (Signed)
-----   Message from Lawana Pai sent at 04/09/2016  8:10 AM EDT ----- Regarding: RE: Referral to neurosurgery I will call the patient. ----- Message ----- From: Barkley Boards, RN Sent: 04/08/2016   9:40 AM To: Forde Dandy Creed Subject: RE: Referral to neurosurgery                   Did anyone let the pt know this info?  Toi Stelly ----- Message ----- From: Lawana Pai Sent: 04/07/2016   8:12 AM To: Barkley Boards, RN Subject: Referral to neurosurgery                       Cloretta Ned - Dr. Fletcher Anon referred this patient to [facility].  I faxed the information to them and Rollene Fare from that office left voicemail that Dr. Reviewed the information and does not feel that patient needs to see neurosurgery at this time and recommended PCP to see him. Just to let you know. Deedie

## 2016-04-13 NOTE — Progress Notes (Signed)
Patient ID: Yvonne Tran, female   DOB: 04/15/1934, 82 y.o.   MRN: [id number]     Cardiology Office Note   Date:  04/14/2016   ID:  Yvonne Tran, DOB 04/29/1934, MRN [id number]  PCP:  Yvonne Caraway, MD    No chief complaint on file. PAD, RF for CAD   Wt Readings from Last 3 Encounters:  04/14/16 212 lb 12.8 oz (96.5 kg)  03/24/16 211 lb 3.2 oz (95.8 kg)  03/19/15 225 lb 9.6 oz (102.3 kg)       History of Present Illness: Yvonne Tran is a 82 y.o. female  who has had PAD. She has had left iliac stents placed in the past.  Her walking is limited by balance problems.  THis has progressed since the last visit.  It now occurs frequently.  She uses a cane, but would prefer a rolling walker.  No calf cramping with walking.  No nonhealing sores on her feet.   She reports leg leg numbness in the lateral side of the leg.  SHe has some lightheadedness after taking BP meds.    BP has been well controlled at home.  Readings are well controlled typically, but increased with extra salt.     Caths in 2009 and 2012 were clear of any significant CAD. She uses NTG rarely. CP has reduced in frequency.  Walking to the mailbox is the most strenuous activity she has and this does not cause any chest pain.   She is limited by back pain.    She reports trouble swallowing since her thyroid operation.    Past Medical History:  Diagnosis Date  . Cardiac angina ([place])   . Glaucoma    GLAUCOMA IN   BOTH EYES:  LEGALLY BLIND IN RIGHT EYE  . Goiter    THROID GOITER WITH COMPRESSION--PT HAVING TROUBLE SWALLOWING AND SOB  . Headache(784.0)    LEFT TEMPORAL AREA  . Heart attack    30 YRS AGO  DR Margarete Horace  EAGLE CARD  . Hyperlipidemia   . Hypertension   . Lipoma   . Pain    RIGHT THIGH FOR QUITE SOME TIME--LEG GIVES AWAY SOMETIMES.  LOWER BACK PAIN  - S/P HX OF PREVIOUS BACK SURGERY  . Peripheral vascular disease ([place])    09/2008 STENT LEFT EXTERNAL ILIAC;  03/20/10  RE-STENT OF LEFT EXTERNAL ILIAC STENT- AT [facility] WITH DR. Meghin Thivierge  . Recurrent upper respiratory infection (URI) MAY 2013   RECENT COLD   . Shortness of breath    WITH EXERTION   . Stroke [facility])    MINI STROKE  YRS AGO     Past Surgical History:  Procedure Laterality Date  . APPENDECTOMY    . BACK SURGERY  1988   PT STATES AFTER THE BACK SURGERY PT HAS PARALYSIS LEFT ARM AND LEG--TOOK 2&1/2 YEARS TO REGAIN USE OF LEFT SIDE--LEFT SIDE REMAINS WEAKER THAN HER RIFHT SIDE.  Marland Kitchen CARDIAC CATHETERIZATION  2012  . EYE SURGERY     left eye, lens replacement  . MASS EXCISION  01/13/2011   Procedure: EXCISION MASS;  Surgeon: Stark Klein, MD;  Location: [facility];  Service: General;  Laterality: Left;  Left abdominal wall mass (subcutaneous, 4-5 cm).  . THYROIDECTOMY  08/14/2011   Procedure: THYROIDECTOMY;  Surgeon: Earnstine Regal, MD;  [facility];  Service: General;  Laterality: N/A;  Total Thyroidectomy  . VASCULAR SURGERY     STENT PLACED 03/2010 RT LEG     Current  Outpatient Prescriptions  Medication Sig Dispense Refill  . amLODipine (NORVASC) 10 MG tablet Take 10 mg by mouth daily.    Marland Kitchen aspirin EC 81 MG tablet Take 81 mg by mouth every morning.     . brimonidine (ALPHAGAN) 0.15 % ophthalmic solution Place 1 drop into the left eye 2 (two) times daily.    . dorzolamide-timolol (COSOPT) 22.3-6.8 MG/ML ophthalmic solution Place 1 drop into both eyes 2 (two) times daily.     . hydrochlorothiazide (MICROZIDE) 12.5 MG capsule Take 12.5 mg by mouth daily as needed. For excess water.    . latanoprost (XALATAN) 0.005 % ophthalmic solution Place 1 drop into both eyes at bedtime.    Marland Kitchen levothyroxine (SYNTHROID, LEVOTHROID) 88 MCG tablet Take 88 mcg by mouth daily before breakfast.    . nitroGLYCERIN (NITROSTAT) 0.4 MG SL tablet Place 0.4 mg under the tongue every 5 (five) minutes as needed. For chest pain.    . prednisoLONE acetate (PRED FORTE) 1 % ophthalmic suspension Place 1 drop into both eyes 4 (four)  times daily.     No current facility-administered medications for this visit.     Allergies:   Crestor [rosuvastatin]; Eggs or egg-derived products; Lemon juice; Milk-related compounds; and Shellfish allergy    Social History:  The patient  reports that she has never smoked. She has never used smokeless tobacco. She reports that she does not drink alcohol or use drugs.   Family History:  The patient's family history includes Cancer in her brother and mother; Heart attack in her father; Heart disease in her father; Hypertension in her father; Stroke in her mother.    ROS:  Please see the history of present illness.   Otherwise, review of systems are positive for leg pains, cough; leg cramps occasionally.   All other systems are reviewed and negative.    PHYSICAL EXAM: VS:  BP 138/70 (BP Location: Right Arm, Patient Position: Sitting, Cuff Size: Large)   Pulse 63   Ht 5\' 5"  (1.651 m)   Wt 212 lb 12.8 oz (96.5 kg)   SpO2 98%   BMI 35.41 kg/m  , BMI Body mass index is 35.41 kg/m. GEN: Well nourished, well developed, in no acute distress  HEENT: normal  Neck: no JVD, carotid bruits, or masses Cardiac: RRR; no murmurs, rubs, or gallops,no edema ; 2+ DP pulses bilaterally Respiratory:  clear to auscultation bilaterally, normal work of breathing GI: soft, nontender, nondistended, + BS MS: no deformity or atrophy  Skin: warm and dry, no rash Neuro:  Strength and sensation are intact Psych: euthymic mood, full affect   EKG:   The ekg ordered in Jan 2017 demonstrates NSR, no ST segment changes; troponin normal in Jan 2017.   Recent Labs: No results found for requested labs within last 8760 hours.   Lipid Panel    Component Value Date/Time   CHOL 198 05/15/2014 0947   TRIG 68.0 05/15/2014 0947   HDL 47.00 05/15/2014 0947   CHOLHDL 4 05/15/2014 0947   VLDL 13.6 05/15/2014 0947   LDLCALC 137 (H) 05/15/2014 0947   LDLDIRECT 196.5 12/12/2012 1104     Other studies  Reviewed: Additional studies/ records that were reviewed today with results demonstrating: prior cath in 2012 showed no significant CAD.   ASSESSMENT AND PLAN:  1. PAD: Prior iliac stents. She had an abnormal lower extremity Doppler with increased velocities at the left hip and above. Palpable pulses bilaterally. No lower extremity sores that will not heal. This  does not appear to be limb threatening at this point.  She follows with Dr. Fletcher Anon.  2. Hyperlipidemia:  She does not tolerate statins.  PC SK 9 inhibitor was discussed but due to cost, this was not pursued.  Manage with a healthy diet.  3. Chest pain- decreased in frequency.  Atypical.  Not related to exertion. 4. Hypertension: Well controlled today. Continue current medications.  Continue to check BP at home regularly.    5. Difficulty swallowing: F/u with PMD.  She feels something moving in her throat that then makes her choke.  6. Balance disorder: She would like a rolling walker.  Rx given for this.  She is seeing her PMD.  She thinks that she would walk more if she had a rolling walker.   Current medicines are reviewed at length with the patient today.  The patient concerns regarding her medicines were addressed.  The following changes have been made:  No change  Labs/ tests ordered today include:  No orders of the defined types were placed in this encounter.   Recommend 150 minutes/week of aerobic exercise Low fat, low carb, high fiber diet recommended  Disposition:   FU in one year   Signed, Larae Grooms, MD  04/14/2016 10:55 AM    [facility] [place], [place],   [address] Phone: [phone]; Fax: [phone]

## 2016-04-14 ENCOUNTER — Ambulatory Visit (INDEPENDENT_AMBULATORY_CARE_PROVIDER_SITE_OTHER): Payer: PPO | Admitting: Interventional Cardiology

## 2016-04-14 ENCOUNTER — Encounter: Payer: Self-pay | Admitting: Interventional Cardiology

## 2016-04-14 VITALS — BP 138/70 | HR 63 | Ht 65.0 in | Wt 212.8 lb

## 2016-04-14 DIAGNOSIS — I739 Peripheral vascular disease, unspecified: Secondary | ICD-10-CM | POA: Diagnosis not present

## 2016-04-14 DIAGNOSIS — R2689 Other abnormalities of gait and mobility: Secondary | ICD-10-CM

## 2016-04-14 DIAGNOSIS — I10 Essential (primary) hypertension: Secondary | ICD-10-CM

## 2016-04-14 DIAGNOSIS — I700 Atherosclerosis of aorta: Secondary | ICD-10-CM | POA: Diagnosis not present

## 2016-04-14 DIAGNOSIS — E782 Mixed hyperlipidemia: Secondary | ICD-10-CM

## 2016-04-14 NOTE — Patient Instructions (Signed)
Medication Instructions:  Your physician recommends that you continue on your current medications as directed. Please refer to the Current Medication list given to you today.   Labwork: None ordered  Testing/Procedures: None ordered  Follow-Up: Your physician wants you to follow-up in: 1 year with Dr. Irish Lack. You will receive a reminder letter in the mail two months in advance. If you don't receive a letter, please call our office to schedule the follow-up appointment.   Any Other Special Instructions Will Be Listed Below (If Applicable).  A prescription for a rolling walker has been given to you. Take this prescription to [facility] to fill.   If you need a refill on your cardiac medications before your next appointment, please call your pharmacy.

## 2016-04-23 ENCOUNTER — Encounter (HOSPITAL_COMMUNITY): Payer: PPO

## 2016-04-23 ENCOUNTER — Ambulatory Visit (HOSPITAL_COMMUNITY)
Admission: RE | Admit: 2016-04-23 | Discharge: 2016-04-23 | Disposition: A | Discharge status: Home / Self Care | Payer: PPO | Source: Ambulatory Visit | Attending: Cardiology | Admitting: Cardiology

## 2016-04-23 DIAGNOSIS — I700 Atherosclerosis of aorta: Secondary | ICD-10-CM | POA: Insufficient documentation

## 2016-04-23 DIAGNOSIS — I739 Peripheral vascular disease, unspecified: Secondary | ICD-10-CM | POA: Diagnosis not present

## 2016-05-11 DIAGNOSIS — E039 Hypothyroidism, unspecified: Secondary | ICD-10-CM | POA: Diagnosis not present

## 2016-05-11 DIAGNOSIS — E559 Vitamin D deficiency, unspecified: Secondary | ICD-10-CM | POA: Diagnosis not present

## 2016-05-11 DIAGNOSIS — Z01419 Encounter for gynecological examination (general) (routine) without abnormal findings: Secondary | ICD-10-CM | POA: Diagnosis not present

## 2016-05-11 DIAGNOSIS — I739 Peripheral vascular disease, unspecified: Secondary | ICD-10-CM | POA: Diagnosis not present

## 2016-05-11 DIAGNOSIS — I2510 Atherosclerotic heart disease of native coronary artery without angina pectoris: Secondary | ICD-10-CM | POA: Diagnosis not present

## 2016-05-11 DIAGNOSIS — I10 Essential (primary) hypertension: Secondary | ICD-10-CM | POA: Diagnosis not present

## 2016-05-11 DIAGNOSIS — E785 Hyperlipidemia, unspecified: Secondary | ICD-10-CM | POA: Diagnosis not present

## 2016-05-11 DIAGNOSIS — R29898 Other symptoms and signs involving the musculoskeletal system: Secondary | ICD-10-CM | POA: Diagnosis not present

## 2016-05-11 DIAGNOSIS — R1310 Dysphagia, unspecified: Secondary | ICD-10-CM | POA: Diagnosis not present

## 2016-05-11 DIAGNOSIS — Z1389 Encounter for screening for other disorder: Secondary | ICD-10-CM | POA: Diagnosis not present

## 2016-05-11 DIAGNOSIS — E2839 Other primary ovarian failure: Secondary | ICD-10-CM | POA: Diagnosis not present

## 2016-05-11 DIAGNOSIS — Z0000 Encounter for general adult medical examination without abnormal findings: Secondary | ICD-10-CM | POA: Diagnosis not present

## 2016-05-12 ENCOUNTER — Other Ambulatory Visit: Payer: Self-pay | Admitting: Family Medicine

## 2016-05-12 DIAGNOSIS — Z1231 Encounter for screening mammogram for malignant neoplasm of breast: Secondary | ICD-10-CM

## 2016-05-12 DIAGNOSIS — R5381 Other malaise: Secondary | ICD-10-CM

## 2016-05-14 ENCOUNTER — Other Ambulatory Visit: Payer: Self-pay | Admitting: Family Medicine

## 2016-05-14 DIAGNOSIS — M8580 Other specified disorders of bone density and structure, unspecified site: Secondary | ICD-10-CM

## 2016-05-26 DIAGNOSIS — H401113 Primary open-angle glaucoma, right eye, severe stage: Secondary | ICD-10-CM | POA: Diagnosis not present

## 2016-05-26 DIAGNOSIS — H401122 Primary open-angle glaucoma, left eye, moderate stage: Secondary | ICD-10-CM | POA: Diagnosis not present

## 2016-05-26 DIAGNOSIS — Z961 Presence of intraocular lens: Secondary | ICD-10-CM | POA: Diagnosis not present

## 2016-05-27 DIAGNOSIS — I10 Essential (primary) hypertension: Secondary | ICD-10-CM | POA: Diagnosis not present

## 2016-05-27 DIAGNOSIS — M5416 Radiculopathy, lumbar region: Secondary | ICD-10-CM | POA: Diagnosis not present

## 2016-05-27 DIAGNOSIS — M316 Other giant cell arteritis: Secondary | ICD-10-CM | POA: Diagnosis not present

## 2016-05-27 DIAGNOSIS — M5412 Radiculopathy, cervical region: Secondary | ICD-10-CM | POA: Diagnosis not present

## 2016-05-27 DIAGNOSIS — R531 Weakness: Secondary | ICD-10-CM | POA: Diagnosis not present

## 2016-05-29 DIAGNOSIS — M316 Other giant cell arteritis: Secondary | ICD-10-CM | POA: Diagnosis not present

## 2016-06-30 DIAGNOSIS — M545 Low back pain: Secondary | ICD-10-CM | POA: Diagnosis not present

## 2016-06-30 DIAGNOSIS — R51 Headache: Secondary | ICD-10-CM | POA: Diagnosis not present

## 2016-06-30 DIAGNOSIS — M5416 Radiculopathy, lumbar region: Secondary | ICD-10-CM | POA: Diagnosis not present

## 2016-06-30 DIAGNOSIS — R531 Weakness: Secondary | ICD-10-CM | POA: Diagnosis not present

## 2016-06-30 DIAGNOSIS — M542 Cervicalgia: Secondary | ICD-10-CM | POA: Diagnosis not present

## 2016-06-30 DIAGNOSIS — M5412 Radiculopathy, cervical region: Secondary | ICD-10-CM | POA: Diagnosis not present

## 2016-07-02 DIAGNOSIS — Z6834 Body mass index (BMI) 34.0-34.9, adult: Secondary | ICD-10-CM | POA: Diagnosis not present

## 2016-07-02 DIAGNOSIS — I10 Essential (primary) hypertension: Secondary | ICD-10-CM | POA: Diagnosis not present

## 2016-07-02 DIAGNOSIS — M5442 Lumbago with sciatica, left side: Secondary | ICD-10-CM | POA: Diagnosis not present

## 2016-07-02 DIAGNOSIS — G8929 Other chronic pain: Secondary | ICD-10-CM | POA: Diagnosis not present

## 2016-08-19 ENCOUNTER — Ambulatory Visit
Admission: RE | Admit: 2016-08-19 | Discharge: 2016-08-19 | Disposition: A | Discharge status: Home / Self Care | Payer: PPO | Source: Ambulatory Visit | Attending: Family Medicine | Admitting: Family Medicine

## 2016-08-19 DIAGNOSIS — Z780 Asymptomatic menopausal state: Secondary | ICD-10-CM | POA: Diagnosis not present

## 2016-08-19 DIAGNOSIS — Z1231 Encounter for screening mammogram for malignant neoplasm of breast: Secondary | ICD-10-CM | POA: Diagnosis not present

## 2016-08-19 DIAGNOSIS — M8580 Other specified disorders of bone density and structure, unspecified site: Secondary | ICD-10-CM

## 2016-08-19 DIAGNOSIS — Z13820 Encounter for screening for osteoporosis: Secondary | ICD-10-CM | POA: Diagnosis not present

## 2016-09-10 DIAGNOSIS — M5416 Radiculopathy, lumbar region: Secondary | ICD-10-CM | POA: Diagnosis not present

## 2016-09-10 DIAGNOSIS — M48062 Spinal stenosis, lumbar region with neurogenic claudication: Secondary | ICD-10-CM | POA: Diagnosis not present

## 2016-09-11 DIAGNOSIS — M48062 Spinal stenosis, lumbar region with neurogenic claudication: Secondary | ICD-10-CM | POA: Diagnosis not present

## 2016-09-11 DIAGNOSIS — M5416 Radiculopathy, lumbar region: Secondary | ICD-10-CM | POA: Diagnosis not present

## 2016-09-28 DIAGNOSIS — H401122 Primary open-angle glaucoma, left eye, moderate stage: Secondary | ICD-10-CM | POA: Diagnosis not present

## 2016-09-28 DIAGNOSIS — Z961 Presence of intraocular lens: Secondary | ICD-10-CM | POA: Diagnosis not present

## 2016-09-28 DIAGNOSIS — H401113 Primary open-angle glaucoma, right eye, severe stage: Secondary | ICD-10-CM | POA: Diagnosis not present

## 2016-11-28 DIAGNOSIS — J209 Acute bronchitis, unspecified: Secondary | ICD-10-CM | POA: Diagnosis not present

## 2017-02-01 DIAGNOSIS — Z961 Presence of intraocular lens: Secondary | ICD-10-CM | POA: Diagnosis not present

## 2017-02-01 DIAGNOSIS — H401113 Primary open-angle glaucoma, right eye, severe stage: Secondary | ICD-10-CM | POA: Diagnosis not present

## 2017-02-16 DIAGNOSIS — I209 Angina pectoris, unspecified: Secondary | ICD-10-CM | POA: Diagnosis not present

## 2017-02-16 DIAGNOSIS — H548 Legal blindness, as defined in USA: Secondary | ICD-10-CM | POA: Diagnosis not present

## 2017-02-16 DIAGNOSIS — I10 Essential (primary) hypertension: Secondary | ICD-10-CM | POA: Diagnosis not present

## 2017-02-16 DIAGNOSIS — H409 Unspecified glaucoma: Secondary | ICD-10-CM | POA: Diagnosis not present

## 2017-02-16 DIAGNOSIS — E039 Hypothyroidism, unspecified: Secondary | ICD-10-CM | POA: Diagnosis not present

## 2017-02-16 DIAGNOSIS — I2510 Atherosclerotic heart disease of native coronary artery without angina pectoris: Secondary | ICD-10-CM | POA: Diagnosis not present

## 2017-02-16 DIAGNOSIS — E559 Vitamin D deficiency, unspecified: Secondary | ICD-10-CM | POA: Diagnosis not present

## 2017-02-16 DIAGNOSIS — M549 Dorsalgia, unspecified: Secondary | ICD-10-CM | POA: Diagnosis not present

## 2017-02-16 DIAGNOSIS — E785 Hyperlipidemia, unspecified: Secondary | ICD-10-CM | POA: Diagnosis not present

## 2017-02-16 DIAGNOSIS — I739 Peripheral vascular disease, unspecified: Secondary | ICD-10-CM | POA: Diagnosis not present

## 2017-03-11 DIAGNOSIS — I739 Peripheral vascular disease, unspecified: Secondary | ICD-10-CM | POA: Diagnosis not present

## 2017-03-11 DIAGNOSIS — H548 Legal blindness, as defined in USA: Secondary | ICD-10-CM | POA: Diagnosis not present

## 2017-03-11 DIAGNOSIS — H409 Unspecified glaucoma: Secondary | ICD-10-CM | POA: Diagnosis not present

## 2017-03-11 DIAGNOSIS — Z7409 Other reduced mobility: Secondary | ICD-10-CM | POA: Diagnosis not present

## 2017-03-16 DIAGNOSIS — H5213 Myopia, bilateral: Secondary | ICD-10-CM | POA: Diagnosis not present

## 2017-03-16 DIAGNOSIS — H401113 Primary open-angle glaucoma, right eye, severe stage: Secondary | ICD-10-CM | POA: Diagnosis not present

## 2017-03-16 DIAGNOSIS — Z961 Presence of intraocular lens: Secondary | ICD-10-CM | POA: Diagnosis not present

## 2017-04-13 NOTE — Progress Notes (Signed)
Cardiology Office Note   Date:  04/14/2017   ID:  Yvonne, Tran 03/27/1934, MRN [id number]  PCP:  Cari Caraway, MD    No chief complaint on file.  PAD  Wt Readings from Last 3 Encounters:  04/14/17 204 lb (92.5 kg)  04/14/16 212 lb 12.8 oz (96.5 kg)  03/24/16 211 lb 3.2 oz (95.8 kg)       History of Present Illness: Yvonne Tran is a 83 y.o. female  who has had PAD. She has had left iliac stents placed in the past.  Her walking is limited by balance problems and now uses a cane, or rolling walker.    Cardiac  Caths in 2009 and 2012 were clear of any significant CAD.  She is limited by back pain.    She reported trouble swallowing since her thyroid operation in the past.  It improved, but still occasionally causes problems.  Better with drinking water.  Since the last visit she notes the following two problems.: She can hear her heartbeat in her right ear, and describes it as a "thunderous noise."  Left leg burning and itching, anteriorly.    She walks some, but is limited by vertigo.   She has lost weight, but is not sure how she did this.  She cut back on sugar.     Past Medical History:  Diagnosis Date  . Cardiac angina ([place])   . Glaucoma    GLAUCOMA IN   BOTH EYES:  LEGALLY BLIND IN RIGHT EYE  . Goiter    THROID GOITER WITH COMPRESSION--PT HAVING TROUBLE SWALLOWING AND SOB  . Headache(784.0)    LEFT TEMPORAL AREA  . Heart attack ([place])    30 YRS AGO  DR Cori Justus  EAGLE CARD  . Hyperlipidemia   . Hypertension   . Lipoma   . Pain    RIGHT THIGH FOR QUITE SOME TIME--LEG GIVES AWAY SOMETIMES.  LOWER BACK PAIN  - S/P HX OF PREVIOUS BACK SURGERY  . Peripheral vascular disease ([place])    09/2008 STENT LEFT EXTERNAL ILIAC;  03/20/10 RE-STENT OF LEFT EXTERNAL ILIAC STENT- AT [facility] WITH DR. Lannie Yusuf  . Recurrent upper respiratory infection (URI) MAY 2013   RECENT COLD   . Shortness of breath    WITH EXERTION   . Stroke [facility])    MINI STROKE  YRS AGO     Past Surgical History:  Procedure Laterality Date  . APPENDECTOMY    . BACK SURGERY  1988   PT STATES AFTER THE BACK SURGERY PT HAS PARALYSIS LEFT ARM AND LEG--TOOK 2&1/2 YEARS TO REGAIN USE OF LEFT SIDE--LEFT SIDE REMAINS WEAKER THAN HER RIFHT SIDE.  Marland Kitchen CARDIAC CATHETERIZATION  2012  . EYE SURGERY     left eye, lens replacement  . MASS EXCISION  01/13/2011   Procedure: EXCISION MASS;  Surgeon: Stark Klein, MD;  Location: [place];  Service: General;  Laterality: Left;  Left abdominal wall mass (subcutaneous, 4-5 cm).  . THYROIDECTOMY  08/14/2011   Procedure: THYROIDECTOMY;  Surgeon: Earnstine Regal, MD;  [facility];  Service: General;  Laterality: N/A;  Total Thyroidectomy  . VASCULAR SURGERY     STENT PLACED 03/2010 RT LEG     Current Outpatient Medications  Medication Sig Dispense Refill  . amLODipine (NORVASC) 10 MG tablet Take 10 mg by mouth daily.    Marland Kitchen aspirin EC 81 MG tablet Take 81 mg by mouth every morning.     Haze Rushing  0.2-0.5 % ophthalmic solution Place 1 drop into the right eye 2 (two) times daily.    . hydrochlorothiazide (MICROZIDE) 12.5 MG capsule Take 12.5 mg by mouth daily as needed. For excess water.    . latanoprost (XALATAN) 0.005 % ophthalmic solution Place 1 drop into both eyes at bedtime.    Marland Kitchen levothyroxine (SYNTHROID, LEVOTHROID) 88 MCG tablet Take 88 mcg by mouth daily before breakfast.    . LUMIGAN 0.01 % SOLN Place 1 drop into the right eye at bedtime.    Mckinley Jewel Dimesylate (RHOPRESSA OP) Place 1 drop into the right eye at bedtime.    . nitroGLYCERIN (NITROSTAT) 0.4 MG SL tablet Place 0.4 mg under the tongue every 5 (five) minutes as needed. For chest pain.    . prednisoLONE acetate (PRED FORTE) 1 % ophthalmic suspension Place 1 drop into both eyes 4 (four) times daily.     No current facility-administered medications for this visit.     Allergies:   Crestor [rosuvastatin]; Eggs or egg-derived products; Lemon juice;  Milk-related compounds; and Shellfish allergy    Social History:  The patient  reports that she has never smoked. She has never used smokeless tobacco. She reports that she does not drink alcohol or use drugs.   Family History:  The patient's family history includes Cancer in her brother and mother; Heart attack in her father; Heart disease in her father; Hypertension in her father; Stroke in her mother.    ROS:  Please see the history of present illness.   Otherwise, review of systems are positive for burning in the left leg.   All other systems are reviewed and negative.    PHYSICAL EXAM: VS:  BP 140/72   Pulse 62   Ht 5\' 5"  (1.651 m)   Wt 204 lb (92.5 kg)   SpO2 91%   BMI 33.95 kg/m  , BMI Body mass index is 33.95 kg/m. GEN: Well nourished, well developed, in no acute distress  HEENT: normal  Neck: no JVD, carotid bruits, or masses Cardiac: RRR; no murmurs, rubs, or gallops,no edema  Respiratory:  clear to auscultation bilaterally, normal work of breathing GI: soft, nontender, nondistended, + BS MS: no deformity or atrophy  Skin: warm and dry, no rash Neuro:  Strength and sensation are intact Psych: euthymic mood, full affect   EKG:   The ekg ordered today demonstrates NSR, poor R wave progression, nonspecific ST changes   Recent Labs: No results found for requested labs within last 8760 hours.   Lipid Panel    Component Value Date/Time   CHOL 198 05/15/2014 0947   TRIG 68.0 05/15/2014 0947   HDL 47.00 05/15/2014 0947   CHOLHDL 4 05/15/2014 0947   VLDL 13.6 05/15/2014 0947   LDLCALC 137 (H) 05/15/2014 0947   LDLDIRECT 196.5 12/12/2012 1104     Other studies Reviewed: Additional studies/ records that were reviewed today with results demonstrating: 2018 ECG reviewed- normal.   ASSESSMENT AND PLAN:  1. PAD/aortic atherosclerosis: prior iliac stents.  Prior abnormal LE arterial DOppler with increased velocities at the left hip and above.  2. Hyperlipidemia:  Will check her lipids today.  LDL target< 100.  LDL 137 in 2016. LDL 175 in 2017.  May be a candidate for PCSK9 inhibitor.   3. HTN: The current medical regimen is effective;  continue present plan and medications. 4. Balance disorder.: We gave her an Rx for a rolling walker in 2018.  She is using this regularly. 5. Leg  burning: 2+ left DP pulse.  Likely neuropathy. F/u with PMD.  I do not think this is related to PAD.   Current medicines are reviewed at length with the patient today.  The patient concerns regarding her medicines were addressed.  The following changes have been made:  No change  Labs/ tests ordered today include:  No orders of the defined types were placed in this encounter.   Recommend 150 minutes/week of aerobic exercise Low fat, low carb, high fiber diet recommended  Disposition:   FU in 1 year   Signed, Larae Grooms, MD  04/14/2017 9:39 AM    [facility] [address], [place], [place]  [address] Phone: [phone]; Fax: [phone]

## 2017-04-14 ENCOUNTER — Encounter: Payer: Self-pay | Admitting: Interventional Cardiology

## 2017-04-14 ENCOUNTER — Ambulatory Visit (INDEPENDENT_AMBULATORY_CARE_PROVIDER_SITE_OTHER): Payer: PPO | Admitting: Interventional Cardiology

## 2017-04-14 VITALS — BP 140/72 | HR 62 | Ht 65.0 in | Wt 204.0 lb

## 2017-04-14 DIAGNOSIS — I739 Peripheral vascular disease, unspecified: Secondary | ICD-10-CM | POA: Diagnosis not present

## 2017-04-14 DIAGNOSIS — I700 Atherosclerosis of aorta: Secondary | ICD-10-CM | POA: Diagnosis not present

## 2017-04-14 DIAGNOSIS — E782 Mixed hyperlipidemia: Secondary | ICD-10-CM | POA: Diagnosis not present

## 2017-04-14 DIAGNOSIS — Z8739 Personal history of other diseases of the musculoskeletal system and connective tissue: Secondary | ICD-10-CM | POA: Diagnosis not present

## 2017-04-14 DIAGNOSIS — I10 Essential (primary) hypertension: Secondary | ICD-10-CM | POA: Diagnosis not present

## 2017-04-14 LAB — LIPID PANEL
Chol/HDL Ratio: 4.1 ratio (ref 0.0–4.4)
Cholesterol, Total: 265 mg/dL — ABNORMAL HIGH (ref 100–199)
HDL: 65 mg/dL (ref >39)
LDL Calculated: 188 mg/dL — ABNORMAL HIGH (ref 0–99)
Triglycerides: 61 mg/dL (ref 0–149)
VLDL Cholesterol Cal: 12 mg/dL (ref 5–40)

## 2017-04-14 LAB — COMPREHENSIVE METABOLIC PANEL
ALT: 21 IU/L (ref 0–32)
AST: 30 IU/L (ref 0–40)
Albumin/Globulin Ratio: 1.5 (ref 1.2–2.2)
Albumin: 4 g/dL (ref 3.5–4.7)
Alkaline Phosphatase: 75 IU/L (ref 39–117)
BUN/Creatinine Ratio: 18 (ref 12–28)
BUN: 16 mg/dL (ref 8–27)
Bilirubin Total: 0.3 mg/dL (ref 0.0–1.2)
CO2: 24 mmol/L (ref 20–29)
Calcium: 9.2 mg/dL (ref 8.7–10.3)
Chloride: 105 mmol/L (ref 96–106)
Creatinine, Ser: 0.9 mg/dL (ref 0.57–1.00)
GFR calc Af Amer: 70 mL/min/{1.73_m2} (ref >59)
GFR calc non Af Amer: 61 mL/min/{1.73_m2} (ref >59)
Globulin, Total: 2.6 g/dL (ref 1.5–4.5)
Glucose: 101 mg/dL — ABNORMAL HIGH (ref 65–99)
Potassium: 4.4 mmol/L (ref 3.5–5.2)
Sodium: 144 mmol/L (ref 134–144)
Total Protein: 6.6 g/dL (ref 6.0–8.5)

## 2017-04-14 NOTE — Patient Instructions (Signed)
Medication Instructions:  Your physician recommends that you continue on your current medications as directed. Please refer to the Current Medication list given to you today.   Labwork: TODAY: CMET, LIPIDS  Testing/Procedures: None ordered  Follow-Up: Your physician wants you to follow-up in: 1 year with Dr. Varanasi. You will receive a reminder letter in the mail two months in advance. If you don't receive a letter, please call our office to schedule the follow-up appointment.   Any Other Special Instructions Will Be Listed Below (If Applicable).     If you need a refill on your cardiac medications before your next appointment, please call your pharmacy.   

## 2017-04-27 DIAGNOSIS — Z961 Presence of intraocular lens: Secondary | ICD-10-CM | POA: Diagnosis not present

## 2017-04-27 DIAGNOSIS — H401113 Primary open-angle glaucoma, right eye, severe stage: Secondary | ICD-10-CM | POA: Diagnosis not present

## 2017-05-18 ENCOUNTER — Ambulatory Visit (INDEPENDENT_AMBULATORY_CARE_PROVIDER_SITE_OTHER): Payer: PPO | Admitting: Cardiovascular Disease

## 2017-05-18 ENCOUNTER — Encounter: Payer: Self-pay | Admitting: Cardiovascular Disease

## 2017-05-18 VITALS — BP 138/64 | HR 63 | Ht 65.0 in | Wt 200.0 lb

## 2017-05-18 DIAGNOSIS — I739 Peripheral vascular disease, unspecified: Secondary | ICD-10-CM | POA: Diagnosis not present

## 2017-05-18 DIAGNOSIS — I10 Essential (primary) hypertension: Secondary | ICD-10-CM | POA: Diagnosis not present

## 2017-05-18 DIAGNOSIS — E785 Hyperlipidemia, unspecified: Secondary | ICD-10-CM

## 2017-05-18 NOTE — Progress Notes (Signed)
Cardiology Office Note   Date:  05/18/2017   ID:  Yvonne Tran, DOB 12-17-1934, MRN [id number]  PCP:  Cari Caraway, MD  Cardiologist:  Dr. Irish Lack  Chief Complaint  Patient presents with  . Follow-up    14 months. Patient says that she has a burning sensation in her legs that has been going on for months now.  . Headache  . Shortness of Breath      History of Present Illness: Yvonne Tran is a 83 y.o. female who is here for a follow-up visit regarding peripheral arterial disease. She had previous left iliac stenting twice most recently in 2012 for in-stent restenosis. She has known history of recurrent chest pain with no evidence of significant coronary artery disease on previous cardiac catheterization in 2009 and 2012. She is not diabetic and does not smoke. She does have known history of hypertension. She reports having back surgery done in 1986 after a spine fracture. She had very slow recovery at that time and never regained her functional capacity that she had before the event.  She has chronic lower back and bilateral leg pain at rest and with activities that is thought to be multifactorial due to spine disease as well as possible mild claudication. She had vascular studies done in April of last year which showed normal ABI bilaterally with borderline increased iliac velocity in the low 300 range.  She walks slowly with a walker and reports stable symptoms.   Past Medical History:  Diagnosis Date  . Cardiac angina ([place])   . Glaucoma    GLAUCOMA IN   BOTH EYES:  LEGALLY BLIND IN RIGHT EYE  . Goiter    THROID GOITER WITH COMPRESSION--PT HAVING TROUBLE SWALLOWING AND SOB  . Headache(784.0)    LEFT TEMPORAL AREA  . Heart attack ([place])    30 YRS AGO  DR VARANASI  EAGLE CARD  . Hyperlipidemia   . Hypertension   . Lipoma   . Pain    RIGHT THIGH FOR QUITE SOME TIME--LEG GIVES AWAY SOMETIMES.  LOWER BACK PAIN  - S/P HX OF PREVIOUS BACK SURGERY  .  Peripheral vascular disease ([place])    09/2008 STENT LEFT EXTERNAL ILIAC;  03/20/10 RE-STENT OF LEFT EXTERNAL ILIAC STENT- AT [facility] WITH DR. VARANASI  . Recurrent upper respiratory infection (URI) MAY 2013   RECENT COLD   . Shortness of breath    WITH EXERTION   . Stroke [facility])    MINI STROKE  YRS AGO     Past Surgical History:  Procedure Laterality Date  . APPENDECTOMY    . BACK SURGERY  1988   PT STATES AFTER THE BACK SURGERY PT HAS PARALYSIS LEFT ARM AND LEG--TOOK 2&1/2 YEARS TO REGAIN USE OF LEFT SIDE--LEFT SIDE REMAINS WEAKER THAN HER RIFHT SIDE.  Marland Kitchen CARDIAC CATHETERIZATION  2012  . EYE SURGERY     left eye, lens replacement  . MASS EXCISION  01/13/2011   Procedure: EXCISION MASS;  Surgeon: Stark Klein, MD;  Location: [place];  Service: General;  Laterality: Left;  Left abdominal wall mass (subcutaneous, 4-5 cm).  . THYROIDECTOMY  08/14/2011   Procedure: THYROIDECTOMY;  Surgeon: Earnstine Regal, MD;  [facility];  Service: General;  Laterality: N/A;  Total Thyroidectomy  . VASCULAR SURGERY     STENT PLACED 03/2010 RT LEG     Current Outpatient Medications  Medication Sig Dispense Refill  . amLODipine (NORVASC) 10 MG tablet Take 10 mg by mouth  daily.    . aspirin EC 81 MG tablet Take 81 mg by mouth every morning.     . COMBIGAN 0.2-0.5 % ophthalmic solution Place 1 drop into the right eye 2 (two) times daily.    . hydrochlorothiazide (MICROZIDE) 12.5 MG capsule Take 12.5 mg by mouth daily as needed. For excess water.    . latanoprost (XALATAN) 0.005 % ophthalmic solution Place 1 drop into both eyes at bedtime.    Marland Kitchen levothyroxine (SYNTHROID, LEVOTHROID) 88 MCG tablet Take 88 mcg by mouth daily before breakfast.    . LUMIGAN 0.01 % SOLN Place 1 drop into the right eye at bedtime.    Mckinley Jewel Dimesylate (RHOPRESSA OP) Place 1 drop into the right eye at bedtime.    . nitroGLYCERIN (NITROSTAT) 0.4 MG SL tablet Place 0.4 mg under the tongue every 5 (five) minutes as needed. For chest  pain.     No current facility-administered medications for this visit.     Allergies:   Crestor [rosuvastatin]; Eggs or egg-derived products; Lemon juice; Milk-related compounds; and Shellfish allergy    Social History:  The patient  reports that she has never smoked. She has never used smokeless tobacco. She reports that she does not drink alcohol or use drugs.   Family History:  The patient's family history includes Cancer in her brother and mother; Heart attack in her father; Heart disease in her father; Hypertension in her father; Stroke in her mother.    ROS:  Please see the history of present illness.   Otherwise, review of systems are positive for none.   All other systems are reviewed and negative.    PHYSICAL EXAM: VS:  BP 138/64 (BP Location: Left Arm, Patient Position: Sitting, Cuff Size: Large)   Pulse 63   Ht 5\' 5"  (1.651 m)   Wt 200 lb (90.7 kg)   BMI 33.28 kg/m  , BMI Body mass index is 33.28 kg/m. GEN: Well nourished, well developed, in no acute distress  HEENT: normal  Neck: no JVD, carotid bruits, or masses Cardiac: RRR; no murmurs, rubs, or gallops,no edema  Respiratory:  clear to auscultation bilaterally, normal work of breathing GI: soft, nontender, nondistended, + BS MS: no deformity or atrophy  Skin: warm and dry, no rash Neuro:  Strength and sensation are intact Psych: euthymic mood, full affect Vascular: Femoral pulses are +1 bilaterally. Distal pulses are palpable.  EKG:  EKG is not ordered today.    Recent Labs: 04/14/2017: ALT 21; BUN 16; Creatinine, Ser 0.90; Potassium 4.4; Sodium 144    Lipid Panel    Component Value Date/Time   CHOL 265 (H) 04/14/2017 0958   TRIG 61 04/14/2017 0958   HDL 65 04/14/2017 0958   CHOLHDL 4.1 04/14/2017 0958   CHOLHDL 4 05/15/2014 0947   VLDL 13.6 05/15/2014 0947   LDLCALC 188 (H) 04/14/2017 0958   LDLDIRECT 196.5 12/12/2012 1104      Wt Readings from Last 3 Encounters:  05/18/17 200 lb (90.7 kg)    04/14/17 204 lb (92.5 kg)  04/14/16 212 lb 12.8 oz (96.5 kg)         ASSESSMENT AND PLAN:  1.  Peripheral arterial disease: She has known history of left external iliac artery stenting in the past. She is known to have moderate iliac disease .  Her symptoms are felt to be multifactorial.  Distal pulses are still palpable.  She does describe burning sensation from the knees down which seems to be suggestive of  neuropathy. I do not think she benefits from revascularization at the present time.  Continue to monitor clinically.  I will be happy to see her if symptoms worsen in the future.  2. Hyperlipidemia: Unfortunately, she is intolerant to statins.  Most recent LDL was 188.  I agree with consideration of a PC SK 9 inhibitor which was suggested by Dr. Irish Lack.  However, the patient is still not agreeable.  3. Essential hypertension blood pressure is reasonably controlled on current medications.   Disposition:   FU with me as needed for worsening claudication.  Continue to follow-up with Dr. Irish Lack.  Signed,  Kathlyn Sacramento, MD  05/18/2017 10:13 AM    [place]

## 2017-05-18 NOTE — Patient Instructions (Signed)
Medication Instructions: Your physician recommends that you continue on your current medications as directed. Please refer to the Current Medication list given to you today.  If you need a refill on your cardiac medications before your next appointment, please call your pharmacy.   Labwork: None  Procedures/Testing: None  Follow-Up: Your physician wants you to follow-up as needed with Dr. Fletcher Anon.  Thank you for choosing [facility] at [facility]!!

## 2017-05-19 DIAGNOSIS — Z1231 Encounter for screening mammogram for malignant neoplasm of breast: Secondary | ICD-10-CM | POA: Diagnosis not present

## 2017-05-19 DIAGNOSIS — Z1389 Encounter for screening for other disorder: Secondary | ICD-10-CM | POA: Diagnosis not present

## 2017-05-19 DIAGNOSIS — E785 Hyperlipidemia, unspecified: Secondary | ICD-10-CM | POA: Diagnosis not present

## 2017-05-19 DIAGNOSIS — Z7189 Other specified counseling: Secondary | ICD-10-CM | POA: Diagnosis not present

## 2017-05-19 DIAGNOSIS — Z6833 Body mass index (BMI) 33.0-33.9, adult: Secondary | ICD-10-CM | POA: Diagnosis not present

## 2017-05-19 DIAGNOSIS — M48062 Spinal stenosis, lumbar region with neurogenic claudication: Secondary | ICD-10-CM | POA: Diagnosis not present

## 2017-05-19 DIAGNOSIS — I10 Essential (primary) hypertension: Secondary | ICD-10-CM | POA: Diagnosis not present

## 2017-05-19 DIAGNOSIS — L259 Unspecified contact dermatitis, unspecified cause: Secondary | ICD-10-CM | POA: Diagnosis not present

## 2017-05-19 DIAGNOSIS — E039 Hypothyroidism, unspecified: Secondary | ICD-10-CM | POA: Diagnosis not present

## 2017-05-19 DIAGNOSIS — I2510 Atherosclerotic heart disease of native coronary artery without angina pectoris: Secondary | ICD-10-CM | POA: Diagnosis not present

## 2017-05-19 DIAGNOSIS — Z0000 Encounter for general adult medical examination without abnormal findings: Secondary | ICD-10-CM | POA: Diagnosis not present

## 2017-05-24 ENCOUNTER — Telehealth: Payer: Self-pay | Admitting: Interventional Cardiology

## 2017-05-24 NOTE — Telephone Encounter (Signed)
Lipid appt made for 06/17/17.

## 2017-05-24 NOTE — Telephone Encounter (Signed)
Patient has been intolerant to statins in the past. Patient states that she is ready to try PCSK9 inhibitor now. Made patient aware that I will forward to PharmD and she will receive a call with further instruction. Patient verbalized understanding and thanked me for the call.

## 2017-05-24 NOTE — Telephone Encounter (Signed)
Pt calling to say she was to call back when she was ready to take the cholesterol shots-pls call

## 2017-06-16 NOTE — Progress Notes (Signed)
Patient ID: Yvonne Tran                 DOB: [birth year]                    MRN: [id number]     HPI: Yvonne Tran is a 83 y.o. female patient of Dr. Irish Lack that presents today for lipid follow up.  PMH includes hyperlipidemia and PAD s/p left iliac stent placement. She has previously been seen by Gwyneth Sprout in lipid clinic and was not interested in PCSK9i therapy at that time due to cost. She has recently decided to pursue PCSK9i therapy.  She presents today for discussion of cholesterol and PCSK9i therpay. She has had severe muscle aches on several different statin medications. She reports that once the medications are stopped this improves. She does not wish to go back on statin medications due to these symptoms. She has also been intolerant to ezetimibe in the past as well.   Risk Factors: PAD s/p stenting LDL Goal: <70, nonHDL <100  Current Medications: none Intolerances: patient reports intolerances to Lipitor, Crestor, lovastatin, and simvastatin (myalgias with all). Did not remember doses or duration of her previous statin use. [facility] pharmacy could only search back to the beginning of 2014 and reported that patient took simvastatin 10mg  daily in 2014. Patient took Livalo 2mg  daily up until mid 2015 when she reported dizziness and not feeling well overall. She also felt poorly on the ezetimibe.   Diet: Most meals from home. Eats meats and vegetables. She eats beef, [place]. Most meals are prepared baked. She has been drinking mostly filtered water. She drinks 1 cup of coffee per day.    Ex[facility]: [facility]   Family History: Cancer in her brother and mother; Heart attack in her father; Heart disease in her father; Hypertension in her father; Stroke in her mother.  Social History: denies alcohol and tobacco products  Labs: 04/14/17:  TC 265, TG 61, HDL 65, LDL 188 (no therapy) 05/15/14:  TC 198, TG 68, HDL 47, LDL 137  (zetia 5-10mg  daily)  Past Medical History:  Diagnosis Date  . Cardiac angina ([place])   . Glaucoma    GLAUCOMA IN   BOTH EYES:  LEGALLY BLIND IN RIGHT EYE  . Goiter    THROID GOITER WITH COMPRESSION--PT HAVING TROUBLE SWALLOWING AND SOB  . Headache(784.0)    LEFT TEMPORAL AREA  . Heart attack ([place])    30 YRS AGO  DR VARANASI  EAGLE CARD  . Hyperlipidemia   . Hypertension   . Lipoma   . Pain    RIGHT THIGH FOR QUITE SOME TIME--LEG GIVES AWAY SOMETIMES.  LOWER BACK PAIN  - S/P HX OF PREVIOUS BACK SURGERY  . Peripheral vascular disease ([place])    09/2008 STENT LEFT EXTERNAL ILIAC;  03/20/10 RE-STENT OF LEFT EXTERNAL ILIAC STENT- AT [facility] WITH DR. VARANASI  . Recurrent upper respiratory infection (URI) MAY 2013   RECENT COLD   . Shortness of breath    WITH EXERTION   . Stroke [facility])    MINI STROKE  YRS AGO     Current Outpatient Medications on File Prior to Visit  Medication Sig Dispense Refill  . amLODipine (NORVASC) 10 MG tablet Take 10 mg by mouth daily.    Marland Kitchen aspirin EC 81 MG tablet Take 81 mg by mouth every morning.     . COMBIGAN 0.2-0.5 % ophthalmic solution Place 1  drop into the right eye 2 (two) times daily.    . hydrochlorothiazide (MICROZIDE) 12.5 MG capsule Take 12.5 mg by mouth daily as needed. For excess water.    . latanoprost (XALATAN) 0.005 % ophthalmic solution Place 1 drop into both eyes at bedtime.    Marland Kitchen levothyroxine (SYNTHROID, LEVOTHROID) 88 MCG tablet Take 88 mcg by mouth daily before breakfast.    . LUMIGAN 0.01 % SOLN Place 1 drop into the right eye at bedtime.    Mckinley Jewel Dimesylate (RHOPRESSA OP) Place 1 drop into the right eye at bedtime.    . nitroGLYCERIN (NITROSTAT) 0.4 MG SL tablet Place 0.4 mg under the tongue every 5 (five) minutes as needed. For chest pain.     No current facility-administered medications on file prior to visit.     Allergies  Allergen Reactions  . Crestor [Rosuvastatin]     MYALGIAS  . Eggs Or Egg-Derived Products Itching,  Nausea Only and Swelling  . Lemon Juice Itching, Nausea Only and Swelling  . Milk-Related Compounds Itching, Nausea Only and Swelling  . Shellfish Allergy Itching, Nausea Only and Swelling    Assessment/Plan: Hyperlipidemia: LDL not at goal. She has been unable to tolerate several different statins, most recently Livalo. She also has been unable to tolerate ezetimibe; thus PCSK9i therapy is only FDA approved option to lower her LDL. Discussed risk/benefit, injection technique, and cost obligations. She is dual eligible for Medicare and Medicaid. She reports that cost should be effective for her at this time. She would like to come to our office to perform her first injection to be sure she administers correctly. We will contact her once we have obtained approval through the insurance.   Thank you,  Lelan Pons. Patterson Hammersmith, [facility]  06/16/2017 12:04 PM

## 2017-06-17 ENCOUNTER — Ambulatory Visit (INDEPENDENT_AMBULATORY_CARE_PROVIDER_SITE_OTHER): Payer: PPO | Admitting: Pharmacist

## 2017-06-17 ENCOUNTER — Encounter: Payer: Self-pay | Admitting: Pharmacist

## 2017-06-17 DIAGNOSIS — E782 Mixed hyperlipidemia: Secondary | ICD-10-CM

## 2017-06-17 NOTE — Patient Instructions (Addendum)
We will send for coverage of PCSK9i (Repatha) Injection every 14 days. Once we have approval we will call you.  Please call our office with any questions or concerns. [phone].

## 2017-06-18 ENCOUNTER — Telehealth: Payer: Self-pay | Admitting: Pharmacist

## 2017-06-18 MED ORDER — EVOLOCUMAB 140 MG/ML ~~LOC~~ SOAJ: 140.0000 mg | SUBCUTANEOUS | 11 refills | Status: DC

## 2017-06-18 NOTE — Telephone Encounter (Signed)
Pt would like to come to office to do injection. She will come on 06/28/17 when she gets back in town. Pt aware to call if has any issues picking up medication before then.

## 2017-06-18 NOTE — Telephone Encounter (Signed)
Pt approved for Repatha through insurance.  

## 2017-06-25 DIAGNOSIS — Z961 Presence of intraocular lens: Secondary | ICD-10-CM | POA: Diagnosis not present

## 2017-06-25 DIAGNOSIS — H401113 Primary open-angle glaucoma, right eye, severe stage: Secondary | ICD-10-CM | POA: Diagnosis not present

## 2017-06-28 ENCOUNTER — Telehealth: Payer: Self-pay | Admitting: Pharmacist

## 2017-06-28 DIAGNOSIS — E782 Mixed hyperlipidemia: Secondary | ICD-10-CM

## 2017-06-28 NOTE — Telephone Encounter (Signed)
Pt presented today for teaching of Repatha injection. She demonstrated appropriate injection technique without incident. Scheduled for labs after 4th dose. Pt aware to call with any issues.

## 2017-07-06 ENCOUNTER — Other Ambulatory Visit (HOSPITAL_COMMUNITY)
Admission: RE | Admit: 2017-07-06 | Discharge: 2017-07-06 | Disposition: A | Discharge status: Home / Self Care | Payer: PPO | Source: Ambulatory Visit | Attending: Obstetrics and Gynecology | Admitting: Obstetrics and Gynecology

## 2017-07-06 ENCOUNTER — Other Ambulatory Visit: Payer: Self-pay | Admitting: Obstetrics and Gynecology

## 2017-07-06 DIAGNOSIS — Z01411 Encounter for gynecological examination (general) (routine) with abnormal findings: Secondary | ICD-10-CM | POA: Insufficient documentation

## 2017-07-06 DIAGNOSIS — N6320 Unspecified lump in the left breast, unspecified quadrant: Secondary | ICD-10-CM

## 2017-07-06 DIAGNOSIS — N6323 Unspecified lump in the left breast, lower outer quadrant: Secondary | ICD-10-CM | POA: Diagnosis not present

## 2017-07-07 LAB — CYTOLOGY - PAP
Diagnosis: NEGATIVE
HPV: NOT DETECTED

## 2017-07-09 ENCOUNTER — Ambulatory Visit
Admission: RE | Admit: 2017-07-09 | Discharge: 2017-07-09 | Disposition: A | Discharge status: Home / Self Care | Payer: PPO | Source: Ambulatory Visit | Attending: Obstetrics and Gynecology | Admitting: Obstetrics and Gynecology

## 2017-07-09 DIAGNOSIS — N6320 Unspecified lump in the left breast, unspecified quadrant: Secondary | ICD-10-CM

## 2017-07-09 DIAGNOSIS — R928 Other abnormal and inconclusive findings on diagnostic imaging of breast: Secondary | ICD-10-CM | POA: Diagnosis not present

## 2017-07-09 DIAGNOSIS — N6489 Other specified disorders of breast: Secondary | ICD-10-CM | POA: Diagnosis not present

## 2017-07-13 DIAGNOSIS — M79675 Pain in left toe(s): Secondary | ICD-10-CM | POA: Diagnosis not present

## 2017-07-13 DIAGNOSIS — L03032 Cellulitis of left toe: Secondary | ICD-10-CM | POA: Diagnosis not present

## 2017-07-22 ENCOUNTER — Ambulatory Visit (INDEPENDENT_AMBULATORY_CARE_PROVIDER_SITE_OTHER): Payer: PPO | Admitting: Cardiology

## 2017-07-22 ENCOUNTER — Encounter: Payer: Self-pay | Admitting: Cardiology

## 2017-07-22 VITALS — BP 144/68 | HR 75 | Ht 65.0 in | Wt 196.1 lb

## 2017-07-22 DIAGNOSIS — E782 Mixed hyperlipidemia: Secondary | ICD-10-CM | POA: Diagnosis not present

## 2017-07-22 DIAGNOSIS — R079 Chest pain, unspecified: Secondary | ICD-10-CM

## 2017-07-22 NOTE — Patient Instructions (Addendum)
Medication Instructions: Your physician recommends that you continue on your current medications as directed. Please refer to the Current Medication list given to you today.   Labwork: Your physician recommends that you return for a FASTING lipid profile and Hepatic function test on 08/17/17   Procedures/Testing: Your physician has requested that you have a lexiscan myoview. For further information please visit [email or web address]. Please follow instruction sheet, as given.    Follow-Up: Your physician wants you to follow-up in: 1 year with Dr. Glennon Hamilton will receive a reminder letter in the mail two months in advance. If you don't receive a letter, please call our office to schedule the follow-up appointment.     Any Additional Special Instructions Will Be Listed Below (If Applicable).     If you need a refill on your cardiac medications before your next appointment, please call your pharmacy.

## 2017-07-22 NOTE — Progress Notes (Signed)
07/22/2017 Yvonne Tran   [birth year]  [id number]  Primary Physician Cari Caraway, MD Primary Cardiologist: Dr. Irish Lack  PV: Dr. Fletcher Anon  Reason for Visit/CC: F/u for HLD and new complaint of chest pain  HPI:  Yvonne Tran is a 83 y.o. female who is being seen today for f/u for HLD and also notes recent CP.   She is followed by Dr. Irish Lack and Dr. Fletcher Anon also follows her for PVD. She has had left iliac stents placed in the past. Cardiac caths in 2009 and 2012 were clear of any significant CAD. She has HLD and intolerant to many statins. Her last lipid panel 04/2017 showed LDL to be 188 mg/dL. She fortunately has just started taking Repatha 1 month ago. She is being followed in the lipid clinic and is scheduled to get repeat FLP and HFTs on 08/17/17. This appointment was made by pharmacist to ensure that she is tolerating Repatha w/o any issues.   Pt reports compliance with Repatha. No side effects. Tolerating well.   She also complains of intermittent CP. Last occurrence was 3 days ago. Feels like chest pressure and tightness. Can occur at rest but also with exertional. Also has mid scapular pain, but more atypical>> feels like sharp pens and needles. She also gets tired if she tries to exert herself. She is currently asymptomatic.     Current Meds  Medication Sig  . amLODipine (NORVASC) 5 MG tablet Take 5 mg by mouth daily.  Marland Kitchen aspirin EC 81 MG tablet Take 81 mg by mouth every morning.   . COMBIGAN 0.2-0.5 % ophthalmic solution Place 1 drop into the right eye 2 (two) times daily.  . Evolocumab (REPATHA SURECLICK) 626 MG/ML SOAJ Inject 140 mg into the skin every 14 (fourteen) days.  . hydrochlorothiazide (MICROZIDE) 12.5 MG capsule Take 12.5 mg by mouth daily as needed. For excess water.  . latanoprost (XALATAN) 0.005 % ophthalmic solution Place 1 drop into both eyes at bedtime.  Marland Kitchen levothyroxine (SYNTHROID, LEVOTHROID) 88 MCG tablet Take 88 mcg by mouth daily  before breakfast.  . LUMIGAN 0.01 % SOLN Place 1 drop into the right eye at bedtime.  Mckinley Jewel Dimesylate (RHOPRESSA OP) Place 1 drop into the right eye at bedtime.  . nitroGLYCERIN (NITROSTAT) 0.4 MG SL tablet Place 0.4 mg under the tongue every 5 (five) minutes as needed. For chest pain.   Allergies  Allergen Reactions  . Crestor [Rosuvastatin]     MYALGIAS  . Eggs Or Egg-Derived Products Itching, Nausea Only and Swelling  . Lemon Juice Itching, Nausea Only and Swelling  . Milk-Related Compounds Itching, Nausea Only and Swelling  . Shellfish Allergy Itching, Nausea Only and Swelling   Past Medical History:  Diagnosis Date  . Cardiac angina ([place])   . Glaucoma    GLAUCOMA IN   BOTH EYES:  LEGALLY BLIND IN RIGHT EYE  . Goiter    THROID GOITER WITH COMPRESSION--PT HAVING TROUBLE SWALLOWING AND SOB  . Headache(784.0)    LEFT TEMPORAL AREA  . Heart attack ([place])    30 YRS AGO  DR VARANASI  EAGLE CARD  . Hyperlipidemia   . Hypertension   . Lipoma   . Pain    RIGHT THIGH FOR QUITE SOME TIME--LEG GIVES AWAY SOMETIMES.  LOWER BACK PAIN  - S/P HX OF PREVIOUS BACK SURGERY  . Peripheral vascular disease ([place])    09/2008 STENT LEFT EXTERNAL ILIAC;  03/20/10 RE-STENT OF LEFT EXTERNAL ILIAC STENT- AT [facility] WITH DR.  VARANASI  . Recurrent upper respiratory infection (URI) MAY 2013   RECENT COLD   . Shortness of breath    WITH EXERTION   . Stroke [facility])    MINI STROKE  YRS AGO    Family History  Problem Relation Age of Onset  . Cancer Mother        brain  . Stroke Mother   . Heart disease Father   . Heart attack Father   . Hypertension Father   . Cancer Brother        prostate  . Anesthesia problems Neg Hx   . Hypotension Neg Hx   . Malignant hyperthermia Neg Hx   . Pseudochol deficiency Neg Hx   . Breast cancer Neg Hx    Past Surgical History:  Procedure Laterality Date  . APPENDECTOMY    . BACK SURGERY  1988   PT STATES AFTER THE BACK SURGERY PT HAS PARALYSIS LEFT ARM AND  LEG--TOOK 2&1/2 YEARS TO REGAIN USE OF LEFT SIDE--LEFT SIDE REMAINS WEAKER THAN HER RIFHT SIDE.  Marland Kitchen CARDIAC CATHETERIZATION  2012  . EYE SURGERY     left eye, lens replacement  . MASS EXCISION  01/13/2011   Procedure: EXCISION MASS;  Surgeon: Stark Klein, MD;  Location: [place];  Service: General;  Laterality: Left;  Left abdominal wall mass (subcutaneous, 4-5 cm).  . THYROIDECTOMY  08/14/2011   Procedure: THYROIDECTOMY;  Surgeon: Earnstine Regal, MD;  Location[facility];  Service: General;  Laterality: N/A;  Total Thyroidectomy  . VASCULAR SURGERY     STENT PLACED 03/2010 RT LEG   Social History   Socioeconomic History  . Marital status: Widowed    Spouse name: Not on file  . Number of children: Not on file  . Years of education: Not on file  . Highest education level: Not on file  Occupational History  . Not on file  Social Needs  . Financial resource strain: Not on file  . Food insecurity:    Worry: Not on file    Inability: Not on file  . Transportation needs:    Medical: Not on file    Non-medical: Not on file  Tobacco Use  . Smoking status: Never Smoker  . Smokeless tobacco: Never Used  Substance and Sexual Activity  . Alcohol use: No  . Drug use: No  . Sexual activity: Not on file  Lifestyle  . Physical activity:    Days per week: Not on file    Minutes per session: Not on file  . Stress: Not on file  Relationships  . Social connections:    Talks on phone: Not on file    Gets together: Not on file    Attends religious service: Not on file    Active member of club or organization: Not on file    Attends meetings of clubs or organizations: Not on file    Relationship status: Not on file  . Intimate partner violence:    Fear of current or ex partner: Not on file    Emotionally abused: Not on file    Physically abused: Not on file    Forced sexual activity: Not on file  Other Topics Concern  . Not on file  Social History Narrative  . Not on file     Review of  Systems: General: negative for chills, fever, night sweats or weight changes.  Cardiovascular: negative for chest pain, dyspnea on exertion, edema, orthopnea, palpitations, paroxysmal nocturnal dyspnea or shortness of  breath Dermatological: negative for rash Respiratory: negative for cough or wheezing Urologic: negative for hematuria Abdominal: negative for nausea, vomiting, diarrhea, bright red blood per rectum, melena, or hematemesis Neurologic: negative for visual changes, syncope, or dizziness All other systems reviewed and are otherwise negative except as noted above.   Physical Exam:  Blood pressure (!) 144/68, pulse 75, height 5\' 5"  (1.651 m), weight 196 lb 1.9 oz (89 kg), SpO2 95 %.  General appearance: alert, cooperative and no distress Neck: no carotid bruit and no JVD Lungs: clear to auscultation bilaterally Heart: regular rate and rhythm, S1, S2 normal, no murmur, click, rub or gallop Extremities: extremities normal, atraumatic, no cyanosis or edema Pulses: 2+ and symmetric Skin: Skin color, texture, turgor normal. No rashes or lesions Neurologic: Grossly normal  EKG not performed -- personally reviewed   ASSESSMENT AND PLAN:   1. HLD: intolerant to many statins. Her last lipid panel 04/2017 showed LDL to be 188 mg/dL. She fortunately has just started taking Repatha 1 month ago. She is being followed in the lipid clinic and is scheduled to get repeat FLP and HFTs on 08/17/17. She is tolerating medication ok w/o side effects.   2. Chest Pain: concerning for possible cardiac etiology. She has many risk factors including PVD, HLD with LDL at 188 mg/DL (just started Repatha) and HTN. Cardiac caths in 2009 and 2012 were clear of any significant CAD. We will set up for Lexiscan NST to r/o ischemia.   3. PVD: She has had left iliac stents placed in the past. She is followed by Dr. Fletcher Anon and recently seen in May. Per note, Dr. Fletcher Anon outlined that "she has chronic lower back and  bilateral leg pain at rest and with activities that is thought to be multifactorial due to spine disease as well as possible mild claudication. She does describe burning sensation from the knees down which seems to be suggestive of neuropathy.I do not think she benefits from revascularization at the present time.  Continue to monitor clinically".  Follow-Up w/ Dr. Irish Lack in April 2020 for her yearly visit. If her stress test is abnormal, we will arrange sooner f/u to discuss further work-up.   Armie Moren Ladoris Gene, MHS [facility] 07/22/2017 9:00 AM

## 2017-07-26 ENCOUNTER — Telehealth (HOSPITAL_COMMUNITY): Payer: Self-pay | Admitting: *Deleted

## 2017-07-26 NOTE — Telephone Encounter (Signed)
Patient given detailed instructions per Myocardial Perfusion Study Information Sheet for the test on 07/29/17 at 0945. Patient notified to arrive 15 minutes early and that it is imperative to arrive on time for appointment to keep from having the test rescheduled.  If you need to cancel or reschedule your appointment, please call the office within 24 hours of your appointment. . Patient verbalized understanding.Yvonne Tran, [place]e [place]

## 2017-07-27 DIAGNOSIS — L03032 Cellulitis of left toe: Secondary | ICD-10-CM | POA: Diagnosis not present

## 2017-07-29 ENCOUNTER — Ambulatory Visit (HOSPITAL_COMMUNITY): Payer: PPO | Attending: Cardiology

## 2017-07-29 DIAGNOSIS — R079 Chest pain, unspecified: Secondary | ICD-10-CM | POA: Diagnosis not present

## 2017-07-29 DIAGNOSIS — R0600 Dyspnea, unspecified: Secondary | ICD-10-CM | POA: Insufficient documentation

## 2017-07-29 DIAGNOSIS — I10 Essential (primary) hypertension: Secondary | ICD-10-CM | POA: Diagnosis not present

## 2017-07-29 LAB — MYOCARDIAL PERFUSION IMAGING
LV dias vol: 71 mL (ref 46–106)
LV sys vol: 22 mL
Peak HR: 93 {beats}/min
RATE: 0.27
Rest HR: 75 {beats}/min
SDS: 0
SRS: 0
SSS: 0
TID: 1.03

## 2017-07-29 MED ORDER — REGADENOSON 0.4 MG/5ML IV SOLN
0.4000 mg | Freq: Once | INTRAVENOUS | Status: AC
  Administered 2017-07-29: 0.4 mg via INTRAVENOUS

## 2017-07-29 MED ORDER — TECHNETIUM TC 99M TETROFOSMIN IV KIT
9.7000 | PACK | Freq: Once | INTRAVENOUS | Status: AC | PRN
  Administered 2017-07-29: 9.7 via INTRAVENOUS
  Filled 2017-07-29: qty 10

## 2017-07-29 MED ORDER — TECHNETIUM TC 99M TETROFOSMIN IV KIT
31.5000 | PACK | Freq: Once | INTRAVENOUS | Status: AC | PRN
  Administered 2017-07-29: 31.5 via INTRAVENOUS
  Filled 2017-07-29: qty 32

## 2017-07-29 NOTE — Progress Notes (Signed)
Pt has been made aware of normal result and verbalized understanding.  jw 07/29/17

## 2017-08-17 ENCOUNTER — Other Ambulatory Visit: Payer: PPO

## 2017-08-17 DIAGNOSIS — E782 Mixed hyperlipidemia: Secondary | ICD-10-CM

## 2017-08-17 LAB — LIPID PANEL
Chol/HDL Ratio: 2.4 ratio (ref 0.0–4.4)
Cholesterol, Total: 161 mg/dL (ref 100–199)
HDL: 68 mg/dL (ref >39)
LDL Calculated: 80 mg/dL (ref 0–99)
Triglycerides: 67 mg/dL (ref 0–149)
VLDL Cholesterol Cal: 13 mg/dL (ref 5–40)

## 2017-08-17 LAB — HEPATIC FUNCTION PANEL
ALT: 20 IU/L (ref 0–32)
AST: 32 IU/L (ref 0–40)
Albumin: 3.8 g/dL (ref 3.5–4.7)
Alkaline Phosphatase: 72 IU/L (ref 39–117)
Bilirubin Total: 0.3 mg/dL (ref 0.0–1.2)
Bilirubin, Direct: 0.11 mg/dL (ref 0.00–0.40)
Total Protein: 6.2 g/dL (ref 6.0–8.5)

## 2017-08-19 ENCOUNTER — Other Ambulatory Visit: Payer: Self-pay | Admitting: Pharmacist

## 2017-08-19 MED ORDER — EVOLOCUMAB 140 MG/ML ~~LOC~~ SOAJ: 140.0000 mg | SUBCUTANEOUS | 11 refills | Status: DC

## 2017-10-06 ENCOUNTER — Other Ambulatory Visit: Payer: Self-pay | Admitting: Family Medicine

## 2017-10-06 DIAGNOSIS — Z1231 Encounter for screening mammogram for malignant neoplasm of breast: Secondary | ICD-10-CM

## 2017-10-12 DIAGNOSIS — L309 Dermatitis, unspecified: Secondary | ICD-10-CM | POA: Diagnosis not present

## 2017-10-18 DIAGNOSIS — R040 Epistaxis: Secondary | ICD-10-CM | POA: Diagnosis not present

## 2017-10-29 DIAGNOSIS — Z961 Presence of intraocular lens: Secondary | ICD-10-CM | POA: Diagnosis not present

## 2017-10-29 DIAGNOSIS — H401133 Primary open-angle glaucoma, bilateral, severe stage: Secondary | ICD-10-CM | POA: Diagnosis not present

## 2017-11-09 ENCOUNTER — Ambulatory Visit: Payer: PPO

## 2017-11-26 ENCOUNTER — Ambulatory Visit
Admission: RE | Admit: 2017-11-26 | Discharge: 2017-11-26 | Disposition: A | Discharge status: Home / Self Care | Payer: PPO | Source: Ambulatory Visit | Attending: Family Medicine | Admitting: Family Medicine

## 2017-11-26 DIAGNOSIS — Z1231 Encounter for screening mammogram for malignant neoplasm of breast: Secondary | ICD-10-CM | POA: Diagnosis not present

## 2017-12-08 DIAGNOSIS — H547 Unspecified visual loss: Secondary | ICD-10-CM | POA: Diagnosis not present

## 2017-12-08 DIAGNOSIS — I10 Essential (primary) hypertension: Secondary | ICD-10-CM | POA: Diagnosis not present

## 2017-12-08 DIAGNOSIS — I25119 Atherosclerotic heart disease of native coronary artery with unspecified angina pectoris: Secondary | ICD-10-CM | POA: Diagnosis not present

## 2017-12-08 DIAGNOSIS — E039 Hypothyroidism, unspecified: Secondary | ICD-10-CM | POA: Diagnosis not present

## 2017-12-08 DIAGNOSIS — M549 Dorsalgia, unspecified: Secondary | ICD-10-CM | POA: Diagnosis not present

## 2017-12-08 DIAGNOSIS — G5793 Unspecified mononeuropathy of bilateral lower limbs: Secondary | ICD-10-CM | POA: Diagnosis not present

## 2017-12-08 DIAGNOSIS — E785 Hyperlipidemia, unspecified: Secondary | ICD-10-CM | POA: Diagnosis not present

## 2017-12-08 DIAGNOSIS — I739 Peripheral vascular disease, unspecified: Secondary | ICD-10-CM | POA: Diagnosis not present

## 2017-12-08 DIAGNOSIS — H409 Unspecified glaucoma: Secondary | ICD-10-CM | POA: Diagnosis not present

## 2017-12-28 DIAGNOSIS — B349 Viral infection, unspecified: Secondary | ICD-10-CM | POA: Diagnosis not present

## 2017-12-28 DIAGNOSIS — J0100 Acute maxillary sinusitis, unspecified: Secondary | ICD-10-CM | POA: Diagnosis not present

## 2018-03-01 DIAGNOSIS — H401122 Primary open-angle glaucoma, left eye, moderate stage: Secondary | ICD-10-CM | POA: Diagnosis not present

## 2018-03-01 DIAGNOSIS — H04123 Dry eye syndrome of bilateral lacrimal glands: Secondary | ICD-10-CM | POA: Diagnosis not present

## 2018-03-01 DIAGNOSIS — H401113 Primary open-angle glaucoma, right eye, severe stage: Secondary | ICD-10-CM | POA: Diagnosis not present

## 2018-03-01 DIAGNOSIS — Z961 Presence of intraocular lens: Secondary | ICD-10-CM | POA: Diagnosis not present

## 2018-04-14 ENCOUNTER — Telehealth: Payer: Self-pay

## 2018-04-14 NOTE — Telephone Encounter (Signed)
Pt agreed to phone visit, she does not have a smart phone or computer.      Virtual Visit Pre-Appointment Phone Call  Steps For Call:  1. Confirm consent - "In the setting of the current Covid19 crisis, you are scheduled for a (phone or video) visit with your provider on (date) at (time).  Just as we do with many in-office visits, in order for you to participate in this visit, we must obtain consent.  If you'd like, I can send this to your mychart (if signed up) or email for you to review.  Otherwise, I can obtain your verbal consent now.  All virtual visits are billed to your insurance company just like a normal visit would be.  By agreeing to a virtual visit, we'd like you to understand that the technology does not allow for your provider to perform an examination, and thus may limit your provider's ability to fully assess your condition.  Finally, though the technology is pretty good, we cannot assure that it will always work on either your or our end, and in the setting of a video visit, we may have to convert it to a phone-only visit.  In either situation, we cannot ensure that we have a secure connection.  Are you willing to proceed?"  2. Give patient instructions for WebEx download to smartphone as below if video visit  3. Advise patient to be prepared with any vital sign or heart rhythm information, their current medicines, and a piece of paper and pen handy for any instructions they may receive the day of their visit  4. Inform patient they will receive a phone call 15 minutes prior to their appointment time (may be from unknown caller ID) so they should be prepared to answer  5. Confirm that appointment type is correct in Epic appointment notes (video vs telephone)    TELEPHONE CALL NOTE  Yvonne Tran has been deemed a candidate for a follow-up tele-health visit to limit community exposure during the Covid-19 pandemic. I spoke with the patient via phone to ensure  availability of phone/video source, confirm preferred email & phone number, and discuss instructions and expectations.  I reminded Yvonne Tran to be prepared with any vital sign and/or heart rhythm information that could potentially be obtained via home monitoring, at the time of her visit. I reminded Yvonne Tran to expect a phone call at the time of her visit if her visit.  Did the patient verbally acknowledge consent to treatment? yes  Gar Ponto, CMA 04/14/2018 2:52 PM   DOWNLOADING THE [place], go to [employer] and type in WebEx in the search bar. [place] [employer], the blue/green circle. The app is free but as with any other app downloads, their phone may require them to verify saved payment information or Apple password. The patient does NOT have to create an account.  - If Android, ask patient to go to [employer] and type in WebEx in the search bar. [place] [employer], the blue/green circle. The app is free but as with any other app downloads, their phone may require them to verify saved payment information or Android password. The patient does NOT have to create an account.   CONSENT FOR TELE-HEALTH VISIT - PLEASE REVIEW  I hereby voluntarily request, consent and authorize [facility] and its employed or contracted physicians, Engineer, materials, nurse practitioners or other licensed health care professionals (the Practitioner),  to provide me with telemedicine health care services (the "Services") as deemed necessary by the treating Practitioner. I acknowledge and consent to receive the Services by the Practitioner via telemedicine. I understand that the telemedicine visit will involve communicating with the Practitioner through live audiovisual communication technology and the disclosure of certain medical information by electronic transmission. I acknowledge that I have been given the  opportunity to request an in-person assessment or other available alternative prior to the telemedicine visit and am voluntarily participating in the telemedicine visit.  I understand that I have the right to withhold or withdraw my consent to the use of telemedicine in the course of my care at any time, without affecting my right to future care or treatment, and that the Practitioner or I may terminate the telemedicine visit at any time. I understand that I have the right to inspect all information obtained and/or recorded in the course of the telemedicine visit and may receive copies of available information for a reasonable fee.  I understand that some of the potential risks of receiving the Services via telemedicine include:  Marland Kitchen Delay or interruption in medical evaluation due to technological equipment failure or disruption; . Information transmitted may not be sufficient (e.g. poor resolution of images) to allow for appropriate medical decision making by the Practitioner; and/or  . In rare instances, security protocols could fail, causing a breach of personal health information.  Furthermore, I acknowledge that it is my responsibility to provide information about my medical history, conditions and care that is complete and accurate to the best of my ability. I acknowledge that Practitioner's advice, recommendations, and/or decision may be based on factors not within their control, such as incomplete or inaccurate data provided by me or distortions of diagnostic images or specimens that may result from electronic transmissions. I understand that the practice of medicine is not an exact science and that Practitioner makes no warranties or guarantees regarding treatment outcomes. I acknowledge that I will receive a copy of this consent concurrently upon execution via email to the email address I last provided but may also request a printed copy by calling the office of [place].    I understand that  my insurance will be billed for this visit.   I have read or had this consent read to me. . I understand the contents of this consent, which adequately explains the benefits and risks of the Services being provided via telemedicine.  . I have been provided ample opportunity to ask questions regarding this consent and the Services and have had my questions answered to my satisfaction. . I give my informed consent for the services to be provided through the use of telemedicine in my medical care  By participating in this telemedicine visit I agree to the above.

## 2018-04-20 NOTE — Progress Notes (Signed)
Virtual Visit via Telephone Note   This visit type was conducted due to national recommendations for restrictions regarding the COVID-19 Pandemic (e.g. social distancing) in an effort to limit this patient's exposure and mitigate transmission in our community.  Due to her co-morbid illnesses, this patient is at least at moderate risk for complications without adequate follow up.  This format is felt to be most appropriate for this patient at this time.  The patient did not have access to video technology/had technical difficulties with video requiring transitioning to audio format only (telephone).  All issues noted in this document were discussed and addressed.  No physical exam could be performed with this format.  Please refer to the patient's chart for her  consent to telehealth for [facility].   Patient did not have the capability to do a video visit.  Evaluation Performed:  Follow-up visit  Date:  04/21/2018   ID:  Yvonne, Tran March 24, 1934, MRN [id number]  Patient Location: Home Provider Location: Home  PCP:  Cari Caraway, MD  Cardiologist:  No primary care provider on file. [place] Electrophysiologist:  None   Chief Complaint:  Chest pain  History of Present Illness:    Yvonne Tran is a 84 y.o. female with PAD. She has had left iliac stents placed in the past.  Her walking is limited by balance problems and now uses a cane, or rolling walker.  Cardiac Caths in 2009 and 2012 were clear of any significant CAD.  She is limited by back pain.   She reported trouble swallowing since her thyroid operation in the past.  It improved, but still occasionally causes problems.  Was Better with drinking water.  In the past, she noted the following two problems.: She can hear her heartbeat in her right ear, and describes it as a "thunderous noise."  Left leg burning and itching, anteriorly.    Denies : . Dizziness. Leg edema.  Orthopnea.  Palpitations. Paroxysmal nocturnal dyspnea. Shortness of breath. Syncope.   She does some walking.  Rare chest pain.     The patient does not have symptoms concerning for COVID-19 infection (fever, chills, cough, or new shortness of breath).    Past Medical History:  Diagnosis Date  . Cardiac angina ([place])   . Glaucoma    GLAUCOMA IN   BOTH EYES:  LEGALLY BLIND IN RIGHT EYE  . Goiter    THROID GOITER WITH COMPRESSION--PT HAVING TROUBLE SWALLOWING AND SOB  . Headache(784.0)    LEFT TEMPORAL AREA  . Heart attack ([place])    30 YRS AGO  DR Dafina Suk  EAGLE CARD  . Hyperlipidemia   . Hypertension   . Lipoma   . Pain    RIGHT THIGH FOR QUITE SOME TIME--LEG GIVES AWAY SOMETIMES.  LOWER BACK PAIN  - S/P HX OF PREVIOUS BACK SURGERY  . Peripheral vascular disease ([place])    09/2008 STENT LEFT EXTERNAL ILIAC;  03/20/10 RE-STENT OF LEFT EXTERNAL ILIAC STENT- AT [facility] WITH DR. Havish Petties  . Recurrent upper respiratory infection (URI) MAY 2013   RECENT COLD   . Shortness of breath    WITH EXERTION   . Stroke [facility])    MINI STROKE  YRS AGO    Past Surgical History:  Procedure Laterality Date  . APPENDECTOMY    . BACK SURGERY  1988   PT STATES AFTER THE BACK SURGERY PT HAS PARALYSIS LEFT ARM AND LEG--TOOK 2&1/2 YEARS TO REGAIN USE OF LEFT SIDE--LEFT SIDE REMAINS WEAKER  THAN HER RIFHT SIDE.  Marland Kitchen CARDIAC CATHETERIZATION  2012  . EYE SURGERY     left eye, lens replacement  . MASS EXCISION  01/13/2011   Procedure: EXCISION MASS;  Surgeon: Stark Klein, MD;  Location: [place];  Service: General;  Laterality: Left;  Left abdominal wall mass (subcutaneous, 4-5 cm).  . THYROIDECTOMY  08/14/2011   Procedure: THYROIDECTOMY;  Surgeon: Earnstine Regal, MD;  [facility];  Service: General;  Laterality: N/A;  Total Thyroidectomy  . VASCULAR SURGERY     STENT PLACED 03/2010 RT LEG     Current Meds  Medication Sig  . amLODipine (NORVASC) 5 MG tablet Take 5 mg by mouth daily.  Marland Kitchen aspirin EC 81 MG tablet Take 81 mg  by mouth every morning.   . COMBIGAN 0.2-0.5 % ophthalmic solution Place 1 drop into the right eye 2 (two) times daily.  Marland Kitchen latanoprost (XALATAN) 0.005 % ophthalmic solution Place 1 drop into both eyes at bedtime.  Marland Kitchen levothyroxine (SYNTHROID, LEVOTHROID) 88 MCG tablet Take 88 mcg by mouth daily before breakfast.  . LUMIGAN 0.01 % SOLN Place 1 drop into the right eye at bedtime.  Mckinley Jewel Dimesylate (RHOPRESSA OP) Place 1 drop into the right eye at bedtime.  . nitroGLYCERIN (NITROSTAT) 0.4 MG SL tablet Place 0.4 mg under the tongue every 5 (five) minutes as needed. For chest pain.     Allergies:   Crestor [rosuvastatin]; Eggs or egg-derived products; Lemon juice; Milk-related compounds; and Shellfish allergy   Social History   Tobacco Use  . Smoking status: Never Smoker  . Smokeless tobacco: Never Used  Substance Use Topics  . Alcohol use: No  . Drug use: No     Family Hx: The patient's family history includes Cancer in her brother and mother; Heart attack in her father; Heart disease in her father; Hypertension in her father; Stroke in her mother. There is no history of Anesthesia problems, Hypotension, Malignant hyperthermia, Pseudochol deficiency, or Breast cancer.  ROS:   Please see the history of present illness.    Occasional chest pain All other systems reviewed and are negative.   Prior CV studies:   The following studies were reviewed today:  Labs reviewed  Labs/Other Tests and Data Reviewed:    EKG:  NSR in 4/19  Recent Labs: 08/17/2017: ALT 20   Recent Lipid Panel Lab Results  Component Value Date/Time   CHOL 161 08/17/2017 10:02 AM   TRIG 67 08/17/2017 10:02 AM   HDL 68 08/17/2017 10:02 AM   CHOLHDL 2.4 08/17/2017 10:02 AM   CHOLHDL 4 05/15/2014 09:47 AM   LDLCALC 80 08/17/2017 10:02 AM   LDLDIRECT 196.5 12/12/2012 11:04 AM    Wt Readings from Last 3 Encounters:  04/21/18 196 lb (88.9 kg)  07/29/17 196 lb (88.9 kg)  07/22/17 196 lb 1.9 oz (89 kg)      Objective:    Vital Signs:  BP 96/62   Pulse 72   Ht 5\' 5"  (1.651 m)   Wt 196 lb (88.9 kg)   BMI 32.62 kg/m    Well nourished, well developed female in no acute distress. No apparent shortness of breath.  Video visit not available  ASSESSMENT & PLAN:    1. PAD/aortic atherosclerosis: No uts on feet that don't heal.  No sign of tissue loss.  COntinue to monitor.  WIll f/u with Dr. Fletcher Anon.  I think her walking is limited in general so future PV procedures would only be indicated  for CLI. 2. Chest pain: Occasionally uses NTG.  Has had a negative cath in the past.  Unclear that her sx are angina.  We did go over precautions to call 911 based on chest pain.   3. Hyperlipidemia: LDL 80 in 8/19.  4. HTN: The current medical regimen is effective;  continue present plan and medications. 5. Balance disorder: Left leg gives out.  Has not fallen.  Using a cane and walker.  I stressed the importance of avoifdig falling. 6. Leg burning: Sx continues.  Likely neuropathy.  COVID-19 Education: The signs and symptoms of COVID-19 were discussed with the patient and how to seek care for testing (follow up with PCP or arrange E-visit).  The importance of social distancing was discussed today.  Time:   Today, I have spent 18 minutes with the patient with telehealth technology discussing the above problems.     Medication Adjustments/Labs and Tests Ordered: Current medicines are reviewed at length with the patient today.  Concerns regarding medicines are outlined above.   Tests Ordered: No orders of the defined types were placed in this encounter.   Medication Changes: No orders of the defined types were placed in this encounter.   Disposition:  Follow up in 1 year(s)  Signed, Larae Grooms, MD  04/21/2018 9:42 AM    [facility]

## 2018-04-21 ENCOUNTER — Telehealth (INDEPENDENT_AMBULATORY_CARE_PROVIDER_SITE_OTHER): Payer: PPO | Admitting: Interventional Cardiology

## 2018-04-21 ENCOUNTER — Other Ambulatory Visit: Payer: Self-pay

## 2018-04-21 ENCOUNTER — Encounter: Payer: Self-pay | Admitting: Interventional Cardiology

## 2018-04-21 VITALS — BP 96/62 | HR 72 | Ht 65.0 in | Wt 196.0 lb

## 2018-04-21 DIAGNOSIS — E782 Mixed hyperlipidemia: Secondary | ICD-10-CM | POA: Diagnosis not present

## 2018-04-21 DIAGNOSIS — R079 Chest pain, unspecified: Secondary | ICD-10-CM

## 2018-04-21 DIAGNOSIS — R072 Precordial pain: Secondary | ICD-10-CM | POA: Diagnosis not present

## 2018-04-21 DIAGNOSIS — I700 Atherosclerosis of aorta: Secondary | ICD-10-CM | POA: Diagnosis not present

## 2018-04-21 DIAGNOSIS — E785 Hyperlipidemia, unspecified: Secondary | ICD-10-CM

## 2018-04-21 DIAGNOSIS — I10 Essential (primary) hypertension: Secondary | ICD-10-CM | POA: Diagnosis not present

## 2018-04-21 DIAGNOSIS — I739 Peripheral vascular disease, unspecified: Secondary | ICD-10-CM

## 2018-04-21 MED ORDER — NITROGLYCERIN 0.4 MG SL SUBL: 0.4000 mg | SUBLINGUAL_TABLET | SUBLINGUAL | 3 refills | Status: DC | PRN

## 2018-04-21 MED ORDER — AMLODIPINE BESYLATE 5 MG PO TABS: 5.0000 mg | ORAL_TABLET | Freq: Every day | ORAL | 3 refills | Status: DC

## 2018-04-21 NOTE — Patient Instructions (Signed)

## 2018-06-29 ENCOUNTER — Other Ambulatory Visit: Payer: Self-pay | Admitting: Pediatric Intensive Care

## 2018-06-29 DIAGNOSIS — Z20822 Contact with and (suspected) exposure to covid-19: Secondary | ICD-10-CM

## 2018-07-04 LAB — NOVEL CORONAVIRUS, NAA: SARS-CoV-2, NAA: NOT DETECTED

## 2018-07-13 DIAGNOSIS — I25119 Atherosclerotic heart disease of native coronary artery with unspecified angina pectoris: Secondary | ICD-10-CM | POA: Diagnosis not present

## 2018-07-13 DIAGNOSIS — I739 Peripheral vascular disease, unspecified: Secondary | ICD-10-CM | POA: Diagnosis not present

## 2018-07-13 DIAGNOSIS — H409 Unspecified glaucoma: Secondary | ICD-10-CM | POA: Diagnosis not present

## 2018-07-13 DIAGNOSIS — E785 Hyperlipidemia, unspecified: Secondary | ICD-10-CM | POA: Diagnosis not present

## 2018-07-13 DIAGNOSIS — I10 Essential (primary) hypertension: Secondary | ICD-10-CM | POA: Diagnosis not present

## 2018-07-13 DIAGNOSIS — E039 Hypothyroidism, unspecified: Secondary | ICD-10-CM | POA: Diagnosis not present

## 2018-07-13 DIAGNOSIS — H547 Unspecified visual loss: Secondary | ICD-10-CM | POA: Diagnosis not present

## 2018-07-13 DIAGNOSIS — G5793 Unspecified mononeuropathy of bilateral lower limbs: Secondary | ICD-10-CM | POA: Diagnosis not present

## 2018-07-13 DIAGNOSIS — M549 Dorsalgia, unspecified: Secondary | ICD-10-CM | POA: Diagnosis not present

## 2018-07-19 DIAGNOSIS — G5793 Unspecified mononeuropathy of bilateral lower limbs: Secondary | ICD-10-CM | POA: Diagnosis not present

## 2018-07-19 DIAGNOSIS — E785 Hyperlipidemia, unspecified: Secondary | ICD-10-CM | POA: Diagnosis not present

## 2018-07-19 DIAGNOSIS — I739 Peripheral vascular disease, unspecified: Secondary | ICD-10-CM | POA: Diagnosis not present

## 2018-07-19 DIAGNOSIS — H409 Unspecified glaucoma: Secondary | ICD-10-CM | POA: Diagnosis not present

## 2018-07-19 DIAGNOSIS — E039 Hypothyroidism, unspecified: Secondary | ICD-10-CM | POA: Diagnosis not present

## 2018-07-19 DIAGNOSIS — E559 Vitamin D deficiency, unspecified: Secondary | ICD-10-CM | POA: Diagnosis not present

## 2018-07-19 DIAGNOSIS — I10 Essential (primary) hypertension: Secondary | ICD-10-CM | POA: Diagnosis not present

## 2018-07-19 DIAGNOSIS — I2510 Atherosclerotic heart disease of native coronary artery without angina pectoris: Secondary | ICD-10-CM | POA: Diagnosis not present

## 2018-07-19 DIAGNOSIS — H539 Unspecified visual disturbance: Secondary | ICD-10-CM | POA: Diagnosis not present

## 2018-07-20 DIAGNOSIS — E785 Hyperlipidemia, unspecified: Secondary | ICD-10-CM | POA: Diagnosis not present

## 2018-07-20 DIAGNOSIS — I209 Angina pectoris, unspecified: Secondary | ICD-10-CM | POA: Diagnosis not present

## 2018-07-20 DIAGNOSIS — J45909 Unspecified asthma, uncomplicated: Secondary | ICD-10-CM | POA: Diagnosis not present

## 2018-07-20 DIAGNOSIS — I2510 Atherosclerotic heart disease of native coronary artery without angina pectoris: Secondary | ICD-10-CM | POA: Diagnosis not present

## 2018-07-20 DIAGNOSIS — I25119 Atherosclerotic heart disease of native coronary artery with unspecified angina pectoris: Secondary | ICD-10-CM | POA: Diagnosis not present

## 2018-07-20 DIAGNOSIS — H409 Unspecified glaucoma: Secondary | ICD-10-CM | POA: Diagnosis not present

## 2018-07-20 DIAGNOSIS — I10 Essential (primary) hypertension: Secondary | ICD-10-CM | POA: Diagnosis not present

## 2018-07-20 DIAGNOSIS — E039 Hypothyroidism, unspecified: Secondary | ICD-10-CM | POA: Diagnosis not present

## 2018-07-27 DIAGNOSIS — H401113 Primary open-angle glaucoma, right eye, severe stage: Secondary | ICD-10-CM | POA: Diagnosis not present

## 2018-07-27 DIAGNOSIS — H401122 Primary open-angle glaucoma, left eye, moderate stage: Secondary | ICD-10-CM | POA: Diagnosis not present

## 2018-07-27 DIAGNOSIS — Z961 Presence of intraocular lens: Secondary | ICD-10-CM | POA: Diagnosis not present

## 2018-07-27 DIAGNOSIS — H04123 Dry eye syndrome of bilateral lacrimal glands: Secondary | ICD-10-CM | POA: Diagnosis not present

## 2018-08-09 DIAGNOSIS — M79675 Pain in left toe(s): Secondary | ICD-10-CM | POA: Diagnosis not present

## 2018-08-09 DIAGNOSIS — M2041 Other hammer toe(s) (acquired), right foot: Secondary | ICD-10-CM | POA: Diagnosis not present

## 2018-08-09 DIAGNOSIS — M79674 Pain in right toe(s): Secondary | ICD-10-CM | POA: Diagnosis not present

## 2018-08-09 DIAGNOSIS — M2042 Other hammer toe(s) (acquired), left foot: Secondary | ICD-10-CM | POA: Diagnosis not present

## 2018-08-12 ENCOUNTER — Encounter: Payer: Self-pay | Admitting: Pharmacist

## 2018-09-17 ENCOUNTER — Emergency Department (HOSPITAL_COMMUNITY): Payer: PPO

## 2018-09-17 ENCOUNTER — Encounter (HOSPITAL_COMMUNITY): Payer: Self-pay | Admitting: Emergency Medicine

## 2018-09-17 ENCOUNTER — Other Ambulatory Visit: Payer: Self-pay

## 2018-09-17 ENCOUNTER — Inpatient Hospital Stay (HOSPITAL_COMMUNITY)
Admission: EM | Admit: 2018-09-17 | Discharge: 2018-09-21 | DRG: 053 | Disposition: A | Discharge status: Home Health | Payer: PPO | Attending: Internal Medicine | Admitting: Internal Medicine

## 2018-09-17 DIAGNOSIS — E890 Postprocedural hypothyroidism: Secondary | ICD-10-CM | POA: Diagnosis not present

## 2018-09-17 DIAGNOSIS — E876 Hypokalemia: Secondary | ICD-10-CM | POA: Diagnosis present

## 2018-09-17 DIAGNOSIS — E782 Mixed hyperlipidemia: Secondary | ICD-10-CM | POA: Diagnosis not present

## 2018-09-17 DIAGNOSIS — Z79890 Hormone replacement therapy (postmenopausal): Secondary | ICD-10-CM | POA: Diagnosis not present

## 2018-09-17 DIAGNOSIS — Z823 Family history of stroke: Secondary | ICD-10-CM | POA: Diagnosis not present

## 2018-09-17 DIAGNOSIS — I739 Peripheral vascular disease, unspecified: Secondary | ICD-10-CM | POA: Diagnosis present

## 2018-09-17 DIAGNOSIS — M4802 Spinal stenosis, cervical region: Secondary | ICD-10-CM | POA: Diagnosis present

## 2018-09-17 DIAGNOSIS — G9520 Unspecified cord compression: Secondary | ICD-10-CM | POA: Diagnosis not present

## 2018-09-17 DIAGNOSIS — Z79899 Other long term (current) drug therapy: Secondary | ICD-10-CM

## 2018-09-17 DIAGNOSIS — K449 Diaphragmatic hernia without obstruction or gangrene: Secondary | ICD-10-CM | POA: Diagnosis not present

## 2018-09-17 DIAGNOSIS — S0003XA Contusion of scalp, initial encounter: Secondary | ICD-10-CM | POA: Diagnosis present

## 2018-09-17 DIAGNOSIS — S14125A Central cord syndrome at C5 level of cervical spinal cord, initial encounter: Secondary | ICD-10-CM | POA: Diagnosis not present

## 2018-09-17 DIAGNOSIS — H548 Legal blindness, as defined in USA: Secondary | ICD-10-CM | POA: Diagnosis not present

## 2018-09-17 DIAGNOSIS — H409 Unspecified glaucoma: Secondary | ICD-10-CM | POA: Diagnosis present

## 2018-09-17 DIAGNOSIS — Z888 Allergy status to other drugs, medicaments and biological substances status: Secondary | ICD-10-CM

## 2018-09-17 DIAGNOSIS — Z95828 Presence of other vascular implants and grafts: Secondary | ICD-10-CM | POA: Diagnosis not present

## 2018-09-17 DIAGNOSIS — I252 Old myocardial infarction: Secondary | ICD-10-CM

## 2018-09-17 DIAGNOSIS — Z91011 Allergy to milk products: Secondary | ICD-10-CM

## 2018-09-17 DIAGNOSIS — I2510 Atherosclerotic heart disease of native coronary artery without angina pectoris: Secondary | ICD-10-CM | POA: Diagnosis present

## 2018-09-17 DIAGNOSIS — I10 Essential (primary) hypertension: Secondary | ICD-10-CM | POA: Diagnosis not present

## 2018-09-17 DIAGNOSIS — R112 Nausea with vomiting, unspecified: Secondary | ICD-10-CM

## 2018-09-17 DIAGNOSIS — Z20828 Contact with and (suspected) exposure to other viral communicable diseases: Secondary | ICD-10-CM | POA: Diagnosis present

## 2018-09-17 DIAGNOSIS — M21372 Foot drop, left foot: Secondary | ICD-10-CM | POA: Diagnosis not present

## 2018-09-17 DIAGNOSIS — E042 Nontoxic multinodular goiter: Secondary | ICD-10-CM | POA: Diagnosis present

## 2018-09-17 DIAGNOSIS — Z8673 Personal history of transient ischemic attack (TIA), and cerebral infarction without residual deficits: Secondary | ICD-10-CM | POA: Diagnosis not present

## 2018-09-17 DIAGNOSIS — Z7982 Long term (current) use of aspirin: Secondary | ICD-10-CM | POA: Diagnosis not present

## 2018-09-17 DIAGNOSIS — W06XXXA Fall from bed, initial encounter: Secondary | ICD-10-CM | POA: Diagnosis present

## 2018-09-17 DIAGNOSIS — Z8249 Family history of ischemic heart disease and other diseases of the circulatory system: Secondary | ICD-10-CM

## 2018-09-17 DIAGNOSIS — R52 Pain, unspecified: Secondary | ICD-10-CM

## 2018-09-17 DIAGNOSIS — Z91013 Allergy to seafood: Secondary | ICD-10-CM

## 2018-09-17 DIAGNOSIS — S199XXA Unspecified injury of neck, initial encounter: Secondary | ICD-10-CM | POA: Diagnosis not present

## 2018-09-17 DIAGNOSIS — S299XXA Unspecified injury of thorax, initial encounter: Secondary | ICD-10-CM | POA: Diagnosis not present

## 2018-09-17 DIAGNOSIS — S0512XA Contusion of eyeball and orbital tissues, left eye, initial encounter: Secondary | ICD-10-CM | POA: Diagnosis not present

## 2018-09-17 DIAGNOSIS — Z91018 Allergy to other foods: Secondary | ICD-10-CM

## 2018-09-17 DIAGNOSIS — Z91012 Allergy to eggs: Secondary | ICD-10-CM

## 2018-09-17 LAB — BASIC METABOLIC PANEL
Anion gap: 11 (ref 5–15)
BUN: 15 mg/dL (ref 8–23)
CO2: 24 mmol/L (ref 22–32)
Calcium: 8.7 mg/dL — ABNORMAL LOW (ref 8.9–10.3)
Chloride: 107 mmol/L (ref 98–111)
Creatinine, Ser: 0.85 mg/dL (ref 0.44–1.00)
GFR calc Af Amer: >60 mL/min (ref >60)
GFR calc non Af Amer: >60 mL/min (ref >60)
Glucose, Bld: 133 mg/dL — ABNORMAL HIGH (ref 70–99)
Potassium: 3.2 mmol/L — ABNORMAL LOW (ref 3.5–5.1)
Sodium: 142 mmol/L (ref 135–145)

## 2018-09-17 LAB — CBC WITH DIFFERENTIAL/PLATELET
Abs Immature Granulocytes: 0.02 10*3/uL (ref 0.00–0.07)
Basophils Absolute: 0 10*3/uL (ref 0.0–0.1)
Basophils Relative: 1 %
Eosinophils Absolute: 0.2 10*3/uL (ref 0.0–0.5)
Eosinophils Relative: 3 %
HCT: 40.2 % (ref 36.0–46.0)
Hemoglobin: 13 g/dL (ref 12.0–15.0)
Immature Granulocytes: 0 %
Lymphocytes Relative: 31 %
Lymphs Abs: 1.4 10*3/uL (ref 0.7–4.0)
MCH: 28.5 pg (ref 26.0–34.0)
MCHC: 32.3 g/dL (ref 30.0–36.0)
MCV: 88.2 fL (ref 80.0–100.0)
Monocytes Absolute: 0.4 10*3/uL (ref 0.1–1.0)
Monocytes Relative: 9 %
Neutro Abs: 2.6 10*3/uL (ref 1.7–7.7)
Neutrophils Relative %: 56 %
Platelets: 234 10*3/uL (ref 150–400)
RBC: 4.56 MIL/uL (ref 3.87–5.11)
RDW: 15.6 % — ABNORMAL HIGH (ref 11.5–15.5)
WBC: 4.7 10*3/uL (ref 4.0–10.5)
nRBC: 0 % (ref 0.0–0.2)

## 2018-09-17 LAB — SARS CORONAVIRUS 2 BY RT PCR (HOSPITAL ORDER, PERFORMED IN ~~LOC~~ HOSPITAL LAB): SARS Coronavirus 2: NEGATIVE

## 2018-09-17 MED ORDER — DEXAMETHASONE SODIUM PHOSPHATE 4 MG/ML IJ SOLN
4.0000 mg | Freq: Three times a day (TID) | INTRAMUSCULAR | Status: DC
  Administered 2018-09-17 – 2018-09-19 (×6): 4 mg via INTRAVENOUS
  Filled 2018-09-17 (×8): qty 1

## 2018-09-17 MED ORDER — NETARSUDIL DIMESYLATE 0.02 % OP SOLN: 1.0000 [drp] | Freq: Every day | OPHTHALMIC | Status: DC

## 2018-09-17 MED ORDER — ACETAMINOPHEN 500 MG PO TABS
1000.0000 mg | ORAL_TABLET | Freq: Once | ORAL | Status: AC
  Administered 2018-09-17: 1000 mg via ORAL
  Filled 2018-09-17: qty 2

## 2018-09-17 MED ORDER — VITAMIN B-12 100 MCG PO TABS
100.0000 ug | ORAL_TABLET | Freq: Every day | ORAL | Status: DC
  Administered 2018-09-17 – 2018-09-21 (×5): 100 ug via ORAL
  Filled 2018-09-17 (×6): qty 1

## 2018-09-17 MED ORDER — IOHEXOL 300 MG/ML  SOLN
75.0000 mL | Freq: Once | INTRAMUSCULAR | Status: AC | PRN
  Administered 2018-09-17: 75 mL via INTRAVENOUS

## 2018-09-17 MED ORDER — GABAPENTIN 100 MG PO CAPS
100.0000 mg | ORAL_CAPSULE | Freq: Three times a day (TID) | ORAL | Status: DC
  Administered 2018-09-17 – 2018-09-18 (×3): 100 mg via ORAL
  Filled 2018-09-17 (×3): qty 1

## 2018-09-17 MED ORDER — BRIMONIDINE TARTRATE-TIMOLOL 0.2-0.5 % OP SOLN
1.0000 [drp] | Freq: Two times a day (BID) | OPHTHALMIC | Status: DC
  Filled 2018-09-17: qty 5

## 2018-09-17 MED ORDER — ENOXAPARIN SODIUM 40 MG/0.4ML ~~LOC~~ SOLN
40.0000 mg | SUBCUTANEOUS | Status: DC
  Administered 2018-09-17 – 2018-09-20 (×4): 40 mg via SUBCUTANEOUS
  Filled 2018-09-17 (×4): qty 0.4

## 2018-09-17 MED ORDER — ONDANSETRON HCL 4 MG/2ML IJ SOLN
4.0000 mg | Freq: Once | INTRAMUSCULAR | Status: AC
  Administered 2018-09-17: 4 mg via INTRAVENOUS
  Filled 2018-09-17: qty 2

## 2018-09-17 MED ORDER — LATANOPROST 0.005 % OP SOLN: 1.0000 [drp] | Freq: Every day | OPHTHALMIC | Status: DC

## 2018-09-17 MED ORDER — MORPHINE SULFATE (PF) 4 MG/ML IV SOLN
4.0000 mg | Freq: Once | INTRAVENOUS | Status: AC
  Administered 2018-09-17: 4 mg via INTRAVENOUS
  Filled 2018-09-17: qty 1

## 2018-09-17 MED ORDER — LATANOPROST 0.005 % OP SOLN
1.0000 [drp] | Freq: Every day | OPHTHALMIC | Status: DC
  Administered 2018-09-18 – 2018-09-20 (×3): 1 [drp] via OPHTHALMIC
  Filled 2018-09-17: qty 2.5

## 2018-09-17 MED ORDER — AMLODIPINE BESYLATE 5 MG PO TABS
5.0000 mg | ORAL_TABLET | Freq: Every day | ORAL | Status: DC
  Administered 2018-09-17 – 2018-09-18 (×2): 5 mg via ORAL
  Filled 2018-09-17 (×2): qty 1

## 2018-09-17 MED ORDER — SODIUM CHLORIDE (PF) 0.9 % IJ SOLN
INTRAMUSCULAR | Status: AC
  Filled 2018-09-17: qty 50

## 2018-09-17 MED ORDER — TIMOLOL MALEATE 0.5 % OP SOLN
1.0000 [drp] | Freq: Two times a day (BID) | OPHTHALMIC | Status: DC
  Administered 2018-09-17 – 2018-09-21 (×8): 1 [drp] via OPHTHALMIC
  Filled 2018-09-17: qty 5

## 2018-09-17 MED ORDER — LEVOTHYROXINE SODIUM 88 MCG PO TABS
88.0000 ug | ORAL_TABLET | Freq: Every day | ORAL | Status: DC
  Administered 2018-09-18 – 2018-09-21 (×4): 88 ug via ORAL
  Filled 2018-09-17 (×4): qty 1

## 2018-09-17 MED ORDER — BRIMONIDINE TARTRATE 0.2 % OP SOLN
1.0000 [drp] | Freq: Two times a day (BID) | OPHTHALMIC | Status: DC
  Administered 2018-09-17 – 2018-09-21 (×8): 1 [drp] via OPHTHALMIC
  Filled 2018-09-17: qty 5

## 2018-09-17 MED ORDER — POTASSIUM CHLORIDE CRYS ER 20 MEQ PO TBCR
40.0000 meq | EXTENDED_RELEASE_TABLET | Freq: Every day | ORAL | Status: DC
  Administered 2018-09-17 – 2018-09-18 (×2): 40 meq via ORAL
  Filled 2018-09-17 (×2): qty 2

## 2018-09-17 MED ORDER — MORPHINE SULFATE (PF) 2 MG/ML IV SOLN
2.0000 mg | INTRAVENOUS | Status: DC | PRN
  Administered 2018-09-17 – 2018-09-18 (×2): 2 mg via INTRAVENOUS
  Filled 2018-09-17 (×2): qty 1

## 2018-09-17 MED ORDER — ACETAMINOPHEN 500 MG PO TABS
1000.0000 mg | ORAL_TABLET | Freq: Four times a day (QID) | ORAL | Status: DC | PRN
  Administered 2018-09-17 – 2018-09-18 (×2): 1000 mg via ORAL
  Filled 2018-09-17 (×2): qty 2

## 2018-09-17 NOTE — Consult Note (Signed)
Reason for Consult: central cord injury Referring Physician: Larene Beach Yvonne Tran is an 84 y.o. female.  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  This surgery occurred in the 1980s at the [facility].  She has had pain and left foot drop ever since.  This contributes to her falling.  Came to emergency room after having been found down after apparently accidentally falling out of bed and landing on her left side of face and forehead experienced severe pain upper extremity bilaterally.  She likely extended her neck in the fall. CT head spine face done in the ED and placed in c-collar by EDP, although this has been removed with negative cervical flexion/extension X rays.  She has been complaining of bilateral hand numbness, weakness and burning.  Past Medical History:  Diagnosis Date  . Cardiac angina ([place])   . Glaucoma    GLAUCOMA IN   BOTH EYES:  LEGALLY BLIND IN RIGHT EYE  . Goiter    THROID GOITER WITH COMPRESSION--PT HAVING TROUBLE SWALLOWING AND SOB  . Headache(784.0)    LEFT TEMPORAL AREA  . Heart attack ([place])    30 YRS AGO  DR VARANASI  EAGLE CARD  . Hyperlipidemia   . Hypertension   . Lipoma   . Pain    RIGHT THIGH FOR QUITE SOME TIME--LEG GIVES AWAY SOMETIMES.  LOWER BACK PAIN  - S/P HX OF PREVIOUS BACK SURGERY  . Peripheral vascular disease ([place])    09/2008 STENT LEFT EXTERNAL ILIAC;  03/20/10 RE-STENT OF LEFT EXTERNAL ILIAC STENT- AT [facility] WITH DR. VARANASI  . Recurrent upper respiratory infection (URI) MAY 2013   RECENT COLD   . Shortness of breath    WITH EXERTION   . Stroke [facility])    MINI STROKE   YRS AGO     Past Surgical History:  Procedure Laterality Date  . APPENDECTOMY    . BACK SURGERY  1988   PT STATES AFTER THE BACK SURGERY PT HAS PARALYSIS LEFT ARM AND LEG--TOOK 2&1/2 YEARS TO REGAIN USE OF LEFT SIDE--LEFT SIDE REMAINS WEAKER THAN HER RIFHT SIDE.  Marland Kitchen CARDIAC CATHETERIZATION  2012  . EYE SURGERY     left eye, lens replacement  . MASS EXCISION  01/13/2011   Procedure: EXCISION MASS;  Surgeon: Stark Klein, MD;  Location: [facility];  Service: General;  Laterality: Left;  Left abdominal wall mass (subcutaneous, 4-5 cm).  . THYROIDECTOMY  08/14/2011   Procedure: THYROIDECTOMY;  Surgeon: Earnstine Regal, MD;  Location: [facility];  Service: General;  Laterality: N/A;  Total Thyroidectomy  . VASCULAR SURGERY     STENT PLACED 03/2010 RT LEG    Family History  Problem Relation Age of Onset  . Cancer Mother        brain  . Stroke Mother   . Heart disease Father   . Heart attack Father   . Hypertension Father   . Cancer Brother        prostate  . Anesthesia problems Neg Hx   . Hypotension Neg Hx   . Malignant hyperthermia Neg Hx   . Pseudochol deficiency Neg Hx   . Breast cancer Neg Hx  Social History:  reports that she has never smoked. She has never used smokeless tobacco. She reports that she does not drink alcohol or use drugs.  Allergies:  Allergies  Allergen Reactions  . Crestor [Rosuvastatin]     MYALGIAS  . Eggs Or Egg-Derived Products Itching, Nausea Only and Swelling  . Lemon Juice Itching, Nausea Only and Swelling  . Milk-Related Compounds Itching, Nausea Only and Swelling  . Repatha [Evolocumab]     Leg swelling and aches  . Shellfish Allergy Itching, Nausea Only and Swelling    Medications: Reviewed  Results for orders placed or performed during the hospital encounter of 09/17/18 (from the past 48 hour(s))  CBC with Differential     Status: Abnormal   Collection Time: 09/17/18  8:03 AM  Result Value Ref Range   WBC 4.7 4.0 - 10.5 K/uL   RBC 4.56 3.87 -  5.11 MIL/uL   Hemoglobin 13.0 12.0 - 15.0 g/dL   HCT 40.2 36.0 - 46.0 %   MCV 88.2 80.0 - 100.0 fL   MCH 28.5 26.0 - 34.0 pg   MCHC 32.3 30.0 - 36.0 g/dL   RDW 15.6 (H) 11.5 - 15.5 %   Platelets 234 150 - 400 K/uL   nRBC 0.0 0.0 - 0.2 %   Neutrophils Relative % 56 %   Neutro Abs 2.6 1.7 - 7.7 K/uL   Lymphocytes Relative 31 %   Lymphs Abs 1.4 0.7 - 4.0 K/uL   Monocytes Relative 9 %   Monocytes Absolute 0.4 0.1 - 1.0 K/uL   Eosinophils Relative 3 %   Eosinophils Absolute 0.2 0.0 - 0.5 K/uL   Basophils Relative 1 %   Basophils Absolute 0.0 0.0 - 0.1 K/uL   Immature Granulocytes 0 %   Abs Immature Granulocytes 0.02 0.00 - 0.07 K/uL    Comment: Performed at [facility], [place] [address], [place], [place] [id number]  Basic metabolic panel     Status: Abnormal   Collection Time: 09/17/18  8:03 AM  Result Value Ref Range   Sodium 142 135 - 145 mmol/L   Potassium 3.2 (L) 3.5 - 5.1 mmol/L   Chloride 107 98 - 111 mmol/L   CO2 24 22 - 32 mmol/L   Glucose, Bld 133 (H) 70 - 99 mg/dL   BUN 15 8 - 23 mg/dL   Creatinine, Ser 0.85 0.44 - 1.00 mg/dL   Calcium 8.7 (L) 8.9 - 10.3 mg/dL   GFR calc non Af Amer >60 >60 mL/min   GFR calc Af Amer >60 >60 mL/min   Anion gap 11 5 - 15    Comment: Performed at [facility], [place] [address] [place], [place] [address]  SARS Coronavirus 2 [facility] order, Performed in [facility] hospital lab) Nasopharyngeal Nasopharyngeal Swab     Status: None   Collection Time: 09/17/18  6:12 PM   Specimen: Nasopharyngeal Swab  Result Value Ref Range   SARS Coronavirus 2 NEGATIVE NEGATIVE    Comment: (NOTE) If result is NEGATIVE SARS-CoV-2 target nucleic acids are NOT DETECTED. The SARS-CoV-2 RNA is generally detectable in upper and lower  respiratory specimens during the acute phase of infection. The lowest  concentration of SARS-CoV-2 viral copies this assay can detect is 250  copies / mL. A negative result does not preclude  SARS-CoV-2 infection  and should not be used as the sole basis for treatment or other  patient management decisions.  A negative result may occur with  improper  specimen collection / handling, submission of specimen other  than nasopharyngeal swab, presence of viral mutation(s) within the  areas targeted by this assay, and inadequate number of viral copies  (<250 copies / mL). A negative result must be combined with clinical  observations, patient history, and epidemiological information. If result is POSITIVE SARS-CoV-2 target nucleic acids are DETECTED. The SARS-CoV-2 RNA is generally detectable in upper and lower  respiratory specimens dur ing the acute phase of infection.  Positive  results are indicative of active infection with SARS-CoV-2.  Clinical  correlation with patient history and other diagnostic information is  necessary to determine patient infection status.  Positive results do  not rule out bacterial infection or co-infection with other viruses. If result is PRESUMPTIVE POSTIVE SARS-CoV-2 nucleic acids MAY BE PRESENT.   A presumptive positive result was obtained on the submitted specimen  and confirmed on repeat testing.  While 2019 novel coronavirus  (SARS-CoV-2) nucleic acids may be present in the submitted sample  additional confirmatory testing may be necessary for epidemiological  and / or clinical management purposes  to differentiate between  SARS-CoV-2 and other Sarbecovirus currently known to infect humans.  If clinically indicated additional testing with an alternate test  methodology [id number]) is advised. The SARS-CoV-2 RNA is generally  detectable in upper and lower respiratory sp ecimens during the acute  phase of infection. The expected result is Negative. Fact Sheet for Patients:  StrictlyIdeas.no Fact Sheet for Healthcare Providers: BankingDealers.co.za This test is not yet approved or cleared by the  [place] FDA and has been authorized for detection and/or diagnosis of SARS-CoV-2 by FDA under an Emergency Use Authorization (EUA).  This EUA will remain in effect (meaning this test can be used) for the duration of the COVID-19 declaration under Section 564(b)(1) of the Act, 21 U.S.C. section 360bbb-3(b)(1), unless the authorization is terminated or revoked sooner. Performed at [facility], [place] [address] [place], [place] [address]     Ct Head Wo Contrast  Result Date: 09/17/2018 CLINICAL DATA:  Head trauma secondary to a fall out of bed this morning. Scalp hematoma over the left frontal bone. EXAM: CT HEAD WITHOUT CONTRAST TECHNIQUE: Contiguous axial images were obtained from the base of the skull through the vertex without intravenous contrast. COMPARISON:  06/01/2009 FINDINGS: Brain: No evidence of acute infarction, hemorrhage, hydrocephalus, extra-axial collection or mass lesion/mass effect. Vascular: No hyperdense vessel or unexpected calcification. Skull: Normal. Negative for fracture or focal lesion. Sinuses/Orbits: No acute abnormality. Tiny metallic foreign body in the anterior aspect of the left eye, unchanged since 06/01/2009. No acute abnormality of the sinuses. Other: Scalp hematoma over the left frontal bone approximately 2 cm in size. This is adjacent to the lateral aspect of the left superior orbital rim. IMPRESSION: No acute intracranial abnormality. Scalp hematoma over the left frontal bone. Electronically Signed   By: Lorriane Shire M.D.   On: 09/17/2018 10:32   Ct Chest W Contrast  Result Date: 09/17/2018 CLINICAL DATA:  Back pain after a fall out of bed this morning. Head trauma with scalp hematoma. EXAM: CT CHEST WITH CONTRAST TECHNIQUE: Multidetector CT imaging of the chest was performed during intravenous contrast administration. CONTRAST:  33mL OMNIPAQUE IOHEXOL 300 MG/ML  SOLN COMPARISON:  CT scan of the abdomen and pelvis dated 09/12/2015  FINDINGS: Cardiovascular: Aortic atherosclerosis. Heart size is normal. Small pericardial effusion, chronic. Mediastinum/Nodes: Thyroidectomy. No hilar or mediastinal adenopathy. Esophagus and trachea appear normal. Lungs/Pleura: 4 mm calcified granuloma in the  left upper lobe. Chronic central diaphragmatic hernia. The dome of the right lobe of the liver protrudes through the hernia and compresses the right middle lobe chronically. Minimal scarring at the left lung base. No effusions. Upper Abdomen: Multiple benign-appearing cysts in the liver. Benign cystic lesion in the spleen. Small stable cyst on the upper pole the left kidney. Small progressive cyst in the upper pole the right kidney. Musculoskeletal: No chest wall abnormality. No acute or significant osseous findings. IMPRESSION: 1. No acute abnormalities. 2. Chronic right central diaphragmatic hernia containing the dome of the right lobe of the liver. Aortic Atherosclerosis (ICD10-I70.0). Electronically Signed   By: Lorriane Shire M.D.   On: 09/17/2018 10:47   Ct Cervical Spine Wo Contrast  Result Date: 09/17/2018 CLINICAL DATA:  Head trauma secondary to a fall out of bed this morning. Scalp hematoma. EXAM: CT CERVICAL SPINE WITHOUT CONTRAST TECHNIQUE: Multidetector CT imaging of the cervical spine was performed without intravenous contrast. Multiplanar CT image reconstructions were also generated. COMPARISON:  None. FINDINGS: Alignment: Normal. Skull base and vertebrae: No acute fracture. No primary bone lesion or focal pathologic process. Soft tissues and spinal canal: No prevertebral fluid or swelling. No visible canal hematoma. Disc levels: C2-3: Moderate right facet arthritis. Slight right foraminal narrowing. C3-4: Small disc bulge to the left of midline. C4-5: Moderate left facet arthritis. C5-6: Disc space narrowing with broad-based endplate osteophyte from the inferior endplate of C5 with slight narrowing of both neural foramina. C6-7: Slight  disc space narrowing. C7-T1: Negative. Upper chest: Negative. Other: None. IMPRESSION: No acute abnormality of the cervical spine. Multilevel degenerative disc and joint disease. Electronically Signed   By: Lorriane Shire M.D.   On: 09/17/2018 10:39   Ct Thoracic Spine Wo Contrast  Result Date: 09/17/2018 CLINICAL DATA:  Back pain and paresthesias and bilateral upper extremity weakness since a fall out of bed this morning. Hematoma on the left side of the forehead. EXAM: CT THORACIC SPINE WITHOUT CONTRAST TECHNIQUE: Multidetector CT images of the thoracic were obtained using the standard protocol without intravenous contrast. COMPARISON:  None. FINDINGS: Alignment: Normal. Vertebrae: No acute fracture or focal pathologic process. Paraspinal and other soft tissues: Negative. Disc levels: No visible disc protrusions. No spinal stenosis. Degenerative disc disease primarily at T7-8, T9-10, and T10-11. Right facet arthritis at T11-12. IMPRESSION: No acute abnormality of the thoracic spine. Multilevel degenerative disc disease. Electronically Signed   By: Lorriane Shire M.D.   On: 09/17/2018 10:28   Mr Cervical Spine Wo Contrast  Result Date: 09/17/2018 CLINICAL DATA:  Head trauma after falling out of bed this morning. EXAM: MRI CERVICAL SPINE WITHOUT CONTRAST TECHNIQUE: Multiplanar, multisequence MR imaging of the cervical spine was performed. No intravenous contrast was administered. COMPARISON:  CT scan dated 09/17/2018 and 06/02/2011 FINDINGS: Alignment: Minimal chronic retrolisthesis of C5 and C6, unchanged since 2013. Vertebrae: No fracture or bone destruction. Cord: Disc protrusion at C3-4 slightly indents the ventral aspect of the left side of the spinal cord without myelopathy. Retrolisthesis and endplate osteophytes compress the spinal cord to an AP dimension of 6 mm at C5-6 without myelopathy. Posterior Fossa, vertebral arteries, paraspinal tissues: There is a tiny amount of prevertebral fluid at C3, C4  and C5 seen on series 6. No other soft tissue edema in the neck. No mass lesions. Disc levels: C2-3: Normal disc.  Moderate right facet arthritis. C3-4: Small soft disc protrusion to the left of midline indenting the ventral aspect of the spinal cord without myelopathy. No  foraminal stenosis. C4-5: Slight left facet arthritis.  Otherwise negative. C5-6: Chronic disc space narrowing and slight retrolisthesis. Narrowing of the AP dimension of the spinal canal with slight compression of the spinal cord without myelopathy. Bilateral foraminal stenosis. C6-7: Degenerative changes of the vertebral endplates. Tiny disc bulges into the neural foramen without foraminal stenosis. C7-T1: Slight right facet arthritis.  Otherwise negative. IMPRESSION: 1. Cervical spinal stenosis at C5-6 with slight compression of the spinal cord but without myelopathy. 2. Soft disc protrusion at C3-4 to the left with slight compression of the spinal cord without myelopathy. 3. No fracture. There is a small amount of prevertebral fluid from C3-C5 which is nonspecific. I see no evidence of other soft tissue injury. Electronically Signed   By: Lorriane Shire M.D.   On: 09/17/2018 14:24   Dg Cerv Spine Flex&ext Only  Result Date: 09/17/2018 CLINICAL DATA:  Head trauma after fall and of the bed this morning. Small amount of prevertebral edema or fluid seen on MRI scan today. EXAM: CERVICAL SPINE - FLEXION AND EXTENSION VIEWS ONLY COMPARISON:  None. FINDINGS: There is slight reversal of the normal cervical lordosis. There is no abnormal subluxation with flexion or extension. Motion in the mid to lower cervical spine is quite limited. IMPRESSION: No abnormal subluxation with flexion or extension. Electronically Signed   By: Lorriane Shire M.D.   On: 09/17/2018 16:05   Ct Maxillofacial Wo Contrast  Result Date: 09/17/2018 CLINICAL DATA:  Facial trauma secondary to a fall out of bed this morning. Scalp hematoma over the left frontal bone. EXAM:  CT MAXILLOFACIAL WITHOUT CONTRAST TECHNIQUE: Multidetector CT imaging of the maxillofacial structures was performed. Multiplanar CT image reconstructions were also generated. COMPARISON:  CT scan of the head dated 06/01/2009 FINDINGS: Osseous: There is no fracture. No destructive process. Orbits: Tiny metallic foreign body in the anterior chamber of the left eye, unchanged. Sinuses: Minimal mucosal thickening of the left maxillary sinus. Otherwise normal. Soft tissues: Scalp hematoma and scalp contusion over the left superior orbital rim. No adjacent fracture. Limited intracranial: Normal. IMPRESSION: 1. No acute abnormality of the maxillofacial structures. Scalp hematoma and contusion over the left superior orbital rim. 2. Tiny metallic foreign body in the anterior chamber of the left eye, unchanged. Electronically Signed   By: Lorriane Shire M.D.   On: 09/17/2018 10:36    Review of Systems - Per HPI   Blood pressure (!) 159/72, pulse 60, temperature 98.6 F (37 C), temperature source Oral, resp. rate 18, height 5\' 5"  (1.651 m), weight 90.7 kg, SpO2 97 %. Physical Exam  Constitutional: She is oriented to person, place, and time. She appears well-developed and well-nourished.  HENT:  Head: Normocephalic and atraumatic.  Eyes: Pupils are equal, round, and reactive to light. EOM are normal.  Neck: Neck supple. Muscular tenderness present.  Neurological: She is alert and oriented to person, place, and time. No cranial nerve deficit. GCS eye subscore is 4. GCS verbal subscore is 5. GCS motor subscore is 6. She displays no Babinski's sign on the right side. She displays no Babinski's sign on the left side.  Reflex Scores:      Tricep reflexes are 3+ on the right side and 3+ on the left side.      Bicep reflexes are 3+ on the right side and 3+ on the left side.      Brachioradialis reflexes are 2+ on the right side and 2+ on the left side.      Patellar reflexes  are 2+ on the right side and 2+ on the  left side.      Achilles reflexes are 2+ on the right side and 2+ on the left side. Bilateral distal upper extremity weakness, principally in hands with burning dysesthesias in both arms.  She has more burning in right arm and feels she is weaker in left hand than in right.  She has baseline left leg weakness and foot drop related to spinal surgery in 1980s at the [facility]. Otherwise no leg weakness or numbness.  Her hand weakness and burning have both improved over the course of the day and her grips have also improved.    Assessment/Plan: Patient has cervical stenosis with cord compression most marked at the C 56 level.  She fell from her bed this morning and sustained a central cord injury, likely secondary to extension with cord injury at the C 56 level.  Her cervical spine is not unstable.  She has principally muscular tenderness without significant bony tenderness.  She has already improved with decadron and gabapentin should help relieve her burning dysesthesias (300 TID).  I have advised the patient and her daughter that they should consider surgery after she has improved to decompress the cervical cord at the C 56 level, as I think she is at increased risk of repeat injury.  They are not sure what they want to do.  This includes deciding if she continues to live alone at home, move in with her daughter, or possibly move back to [place], where she has more support.  I told her I did not think this surgery needs to be done acutely, but we can revisit the idea in a few weeks.  I also told her this could be deferred until her return to [place] and she could have it done there.  I also explained that the surgery would be to prevent reinjury and to relieve ongoing cord compression, but that it would not make her better; this will likely occur with time.  She may benefit from Rehab depending on the continued severity of her upper extremity weakness, although this does appear to be improving  fairly rapidly.  Peggyann Shoals, MD 09/17/2018, 7:23 PM

## 2018-09-17 NOTE — ED Notes (Signed)
C-collar removed for pt to travel to xray. MD consulted and agreed that c collar can be removed safely at this time.

## 2018-09-17 NOTE — H&P (Addendum)
HPI   Yvonne Tran [id number] DOB: 08/25/1934 DOA: 09/17/2018  PCP: Cari Caraway, MD   Chief Complaint: Fall out of bed, pain  HPI:  84 year old African-American female known PVD with ileal stents in the past CAD with questionable heart attack previously-cath 2009 2012 nonobstructive-intolerant to statins-multinodular goiter status post resection 08/17/2017 Glaucoma in both eyes Has had a history of back surgery which failed and had severe inability to move left side of her body uses walker and cane  Came to emergency room after having been found down after apparently accidentally falling out of bed and landing on her left side experienced severe pain upper extremity bilaterally CT head spine face done in the ED and placed in c-collar by EDP  Review of Systems:  Pertinent +'s: Severe pain, weakness of both upper and lower extremities inability to dorsi or plantarflex Pertinent -"s: Unclear if able to pass urine versus not she has a pure wick-did not pass urine before coming to the emergency room-no chest pain no chills no Reiger no fever no dark stool no tarry stool  ED Course:   Given morphine and Tylenol with relief in pain from 10/10--7/10 also started on gabapentin 100 3 times daily and Decadron 4 mg every 8 Neurosurgeon Dr. Vertell Limber consulted by ED and will see patient in consult but did not feel any surgical indication was imminent   Past Medical History:  Diagnosis Date  . Cardiac angina ([place])   . Glaucoma    GLAUCOMA IN   BOTH EYES:  LEGALLY BLIND IN RIGHT EYE  . Goiter    THROID GOITER WITH COMPRESSION--PT HAVING TROUBLE SWALLOWING AND SOB  . Headache(784.0)    LEFT TEMPORAL AREA  . Heart attack ([place])    30 YRS AGO  DR VARANASI  EAG[place] CARD  . Hyperlipidemia   . Hypertension   . Lipoma   . Pain    RIGHT THIGH FOR QUITE SOME TIME--LEG GIVES AWAY SOMETIMES.  LOWER BACK PAIN  - S/P HX OF PREVIOUS BACK SURGERY  . Peripheral vascular disease ([place])    09/2008 STENT LEFT EXTERNAL ILIAC;  03/20/10 RE-STENT OF LEFT EXTERNAL ILIAC STENT- AT [facility] WITH DR. VARANASI  . Recurrent upper respiratory infection (URI) MAY 2013   RECENT COLD   . Shortness of breath    WITH EXERTION   . Stroke [facility])    MINI STROKE  YRS AGO    Past Surgical History:  Procedure Laterality Date  . APPENDECTOMY    . BACK SURGERY  1988   PT STATES AFTER THE BACK SURGERY PT HAS PARALYSIS LEFT ARM AND LEG--TOOK 2&1/2 YEARS TO REGAIN USE OF LEFT SIDE--LEFT SIDE REMAINS WEAKER THAN HER RIFHT SIDE.  Marland Kitchen CARDIAC CATHETERIZATION  2012  . EYE SURGERY     left eye, lens replacement  . MASS EXCISION  01/13/2011   Procedure: EXCISION MASS;  Surgeon: Stark Klein, MD;  Location: [facility];  Service: General;  Laterality: Left;  Left abdominal wall mass (subcutaneous, 4-5 cm).  . THYROIDECTOMY  08/14/2011   Procedure: THYROIDECTOMY;  Surgeon: Earnstine Regal, MD;  Location: [facility];  Service: General;  Laterality: N/A;  Total Thyroidectomy  . VASCULAR SURGERY     STENT PLACED 03/2010 RT LEG    reports that she has never smoked. She has never used smokeless tobacco. She reports that she does not drink alcohol or use drugs.  Mobility: This is a walker and cane at the baseline  Allergies  Allergen Reactions  .  Crestor [Rosuvastatin]     MYALGIAS  . Eggs Or Egg-Derived Products Itching, Nausea Only and Swelling  . Lemon Juice Itching, Nausea Only and Swelling  . Milk-Related Compounds Itching, Nausea Only and Swelling  . Repatha [Evolocumab]     Leg swelling and aches  . Shellfish Allergy Itching, Nausea Only and Swelling   Family History  Problem Relation Age of Onset  . Cancer Mother        brain  . Stroke Mother   . Heart disease Father   . Heart attack Father   . Hypertension Father   . Cancer Brother        prostate  . Anesthesia problems Neg Hx   . Hypotension Neg Hx   . Malignant hyperthermia Neg Hx   . Pseudochol deficiency Neg Hx   . Breast cancer Neg Hx    Prior to  Admission medications   Medication Sig Start Date End Date Taking? Authorizing Provider  amLODipine (NORVASC) 5 MG tablet Take 1 tablet (5 mg total) by mouth daily. 04/21/18  Yes Jettie Booze, MD  aspirin EC 81 MG tablet Take 81 mg by mouth every morning.    Yes [provider]  BIOTIN PO Take 1 tablet by mouth daily.   Yes [provider]  COMBIGAN 0.2-0.5 % ophthalmic solution Place 1 drop into the right eye 2 (two) times daily. 02/12/17  Yes [provider]  latanoprost (XALATAN) 0.005 % ophthalmic solution Place 1 drop into both eyes at bedtime.   Yes [provider]  levothyroxine (SYNTHROID, LEVOTHROID) 88 MCG tablet Take 88 mcg by mouth daily before breakfast.   Yes [provider]  LUMIGAN 0.01 % SOLN Place 1 drop into the right eye at bedtime. 02/12/17  Yes [provider]  Multiple Vitamins-Minerals (CENTRUM SILVER 50+WOMEN) TABS Take 1 tablet by mouth daily.   Yes [provider]  Netarsudil Dimesylate (RHOPRESSA OP) Place 1 drop into the right eye at bedtime.   Yes [provider]  nitroGLYCERIN (NITROSTAT) 0.4 MG SL tablet Place 1 tablet (0.4 mg total) under the tongue every 5 (five) minutes as needed. For chest pain. 04/21/18  Yes Jettie Booze, MD  vitamin B-12 (CYANOCOBALAMIN) 100 MCG tablet Take 100 mcg by mouth daily.   Yes [provider]    Physical Exam:  Vitals:   09/17/18 1500 09/17/18 1605  BP: (!) 157/73 (!) 159/72  Pulse: (!) 59 60  Resp:  18  Temp:    SpO2: 94% 97%     Awake alert coherent able to track with eyes-severe point tenderness to C6-C7-C8 T1-T2-T3 spinous processes and paravertebral spasm  No thyromegaly  Very poor dentition is somewhat edentulous  No JVD  S1-S2 no murmur rub or gallop  Abdomen soft no rebound no guarding  She is very weak in her upper extremities can barely grab my fingers she has severe difficulty raising her right arm over her head  cannot dorsi or plantarflex sensory is diminished bilaterally  I have personally reviewed following labs and imaging studies  Labs:   Potassium 3.2 BUN/creatinine 15/0.8 White count normal hemoglobin 13  Imaging studies:   CTs of head and neck show CT evidence of C2-3 moderate facet stenosis right foraminal narrowing small disc bulge at C3-C4, C4-C5 moderate left facet arthritis--C5-C6 disc space narrowing with broad-based osteophyte inferior endplate C5 with slight narrowing at C6 and 7  MRI of the neck shows spinal stenosis C5 and 6 with slight compression  of spinal cord without myelopathy and soft disc protrusion C3-4 compression spinal cord no fracture noted small amount of prevertebral fluid C3-C5  Medical tests:   EKG independently reviewed: None  Test discussed with performing physician:  Yes discussed with Dr. Maryan Rued of the emergency room  Decision to obtain old records:      Review and summation of old records:     Assessment/Plan Central cord syndrome Defer to neurosurgery further management-continuing Decadron, gabapentin, pain control Tylenol morphine-they will see in consult Once seen by them therapy can evaluate I do not think she will be able to go home right away from hospital Severe glaucoma legally blind Continue eyedrops Mild hypokalemia Give K. Dur 40 and repeat labs a.m. Reported heart attack in the past with negative cath 2009 2012 Holding aspirin at this time resume once patient cleared by neurosurgery May need to be placed on beta-blocker/ACE-can continue Norvasc 5 Hypothyroidism Continue Synthroid 88 daily, outpatient TSH    Severity of Illness: The appropriate patient status for this patient is INPATIENT. Inpatient status is judged to be reasonable and necessary in order to provide the required intensity of service to ensure the patient's safety. The patient's presenting symptoms, physical exam findings, and initial radiographic and laboratory  data in the context of their chronic comorbidities is felt to place them at high risk for further clinical deterioration. Furthermore, it is not anticipated that the patient will be medically stable for discharge from the hospital within 2 midnights of admission. The following factors support the patient status of inpatient.   " The patient's presenting symptoms include severe weakness. " The worrisome physical exam findings include severe weakness which is new to the patient secondary to spinal cord injury. " The initial radiographic and laboratory data are worrisome because of osteophytes on CTs and MRIs. " The chronic co-morbidities include multiple.   * I certify that at the point of admission it is my clinical judgment that the patient will require inpatient hospital care spanning beyond 2 midnights from the point of admission due to high intensity of service, high risk for further deterioration and high frequency of surveillance required.*    DVT prophylaxis: Lovenox Code Status: Full CODE STATUS Family Communication: None Consults called: Neurosurgery consulted as above  Time spent: 62 minutes  Verlon Au, MD [days-call my NP partners at night for Care related issues] [facility] --[facility] [employer] [facility] , [email or web address]; password [facility]  09/17/2018, 4:59 PM

## 2018-09-17 NOTE — ED Notes (Signed)
ED TO INPATIENT HANDOFF REPORT  ED Nurse Name and Phone #:  Leafy Kindle [phone]  S Name/Age/Gender Yvonne Tran 84 y.o. female Room/Bed: WA09/WA09  Code Status   Code Status: Prior  Home/SNF/Other Home Patient oriented to: self, place, time and situation Is this baseline? Yes   Triage Complete: Triage complete  Chief Complaint Fall  Triage Note Pt family states that patient fell out of bed this morning. Pt has hematoma on left lateral forehead. Denies LOC or taking blood thinners.    Allergies Allergies  Allergen Reactions  . Crestor [Rosuvastatin]     MYALGIAS  . Eggs Or Egg-Derived Products Itching, Nausea Only and Swelling  . Lemon Juice Itching, Nausea Only and Swelling  . Milk-Related Compounds Itching, Nausea Only and Swelling  . Repatha [Evolocumab]     Leg swelling and aches  . Shellfish Allergy Itching, Nausea Only and Swelling    Level of Care/Admitting Diagnosis ED Disposition    ED Disposition Condition Comment   Admit  Hospital Area: [facility] [[id number]]  Level of Care: Med-Surg [16]  Covid Evaluation: Asymptomatic Screening Protocol (No Symptoms)  Diagnosis: Central cord syndrome at C5 level of cervical spinal cord, sequela [facility] [id number]  Admitting Physician: Nita Sells [phone]  Attending Physician: Nita Sells [phone]  Estimated length of stay: 3 - 4 days  Certification:: I certify this patient will need inpatient services for at least 2 midnights  PT Class (Do Not Modify): Inpatient [101]  PT Acc Code (Do Not Modify): Private [1]       B Medical/Surgery History Past Medical History:  Diagnosis Date  . Cardiac angina ([place])   . Glaucoma    GLAUCOMA IN   BOTH EYES:  LEGALLY BLIND IN RIGHT EYE  . Goiter    THROID GOITER WITH COMPRESSION--PT HAVING TROUBLE SWALLOWING AND SOB  . Headache(784.0)    LEFT TEMPORAL AREA  . Heart attack ([place])    30 YRS AGO  DR VARANASI  EAGLE CARD  .  Hyperlipidemia   . Hypertension   . Lipoma   . Pain    RIGHT THIGH FOR QUITE SOME TIME--LEG GIVES AWAY SOMETIMES.  LOWER BACK PAIN  - S/P HX OF PREVIOUS BACK SURGERY  . Peripheral vascular disease ([place])    09/2008 STENT LEFT EXTERNAL ILIAC;  03/20/10 RE-STENT OF LEFT EXTERNAL ILIAC STENT- AT [facility] WITH DR. VARANASI  . Recurrent upper respiratory infection (URI) MAY 2013   RECENT COLD   . Shortness of breath    WITH EXERTION   . Stroke [facility])    MINI STROKE  YRS AGO    Past Surgical History:  Procedure Laterality Date  . APPENDECTOMY    . BACK SURGERY  1988   PT STATES AFTER THE BACK SURGERY PT HAS PARALYSIS LEFT ARM AND LEG--TOOK 2&1/2 YEARS TO REGAIN USE OF LEFT SIDE--LEFT SIDE REMAINS WEAKER THAN HER RIFHT SIDE.  Marland Kitchen CARDIAC CATHETERIZATION  2012  . EYE SURGERY     left eye, lens replacement  . MASS EXCISION  01/13/2011   Procedure: EXCISION MASS;  Surgeon: Stark Klein, MD;  Location: [facility];  Service: General;  Laterality: Left;  Left abdominal wall mass (subcutaneous, 4-5 cm).  . THYROIDECTOMY  08/14/2011   Procedure: THYROIDECTOMY;  Surgeon: Earnstine Regal, MD;  Location: [facility];  Service: General;  Laterality: N/A;  Total Thyroidectomy  . VASCULAR SURGERY     STENT PLACED 03/2010 RT LEG     A IV Location/Drains/Wounds Patient Lines/Drains/Airways  Status   Active Line/Drains/Airways    Name:   Placement date:   Placement time:   Site:   Days:   Peripheral IV 09/17/18 Left Antecubital   09/17/18    0808    Antecubital   less than 1   Incision 01/13/11 Other (Comment) Left   01/13/11    1045     2804   Incision 08/14/11 Neck   08/14/11    1018     2591          Intake/Output Last 24 hours No intake or output data in the 24 hours ending 09/17/18 1949  Labs/Imaging Results for orders placed or performed during the hospital encounter of 09/17/18 (from the past 48 hour(s))  CBC with Differential     Status: Abnormal   Collection Time: 09/17/18  8:03 AM  Result Value Ref Range    WBC 4.7 4.0 - 10.5 K/uL   RBC 4.56 3.87 - 5.11 MIL/uL   Hemoglobin 13.0 12.0 - 15.0 g/dL   HCT 40.2 36.0 - 46.0 %   MCV 88.2 80.0 - 100.0 fL   MCH 28.5 26.0 - 34.0 pg   MCHC 32.3 30.0 - 36.0 g/dL   RDW 15.6 (H) 11.5 - 15.5 %   Platelets 234 150 - 400 K/uL   nRBC 0.0 0.0 - 0.2 %   Neutrophils Relative % 56 %   Neutro Abs 2.6 1.7 - 7.7 K/uL   Lymphocytes Relative 31 %   Lymphs Abs 1.4 0.7 - 4.0 K/uL   Monocytes Relative 9 %   Monocytes Absolute 0.4 0.1 - 1.0 K/uL   Eosinophils Relative 3 %   Eosinophils Absolute 0.2 0.0 - 0.5 K/uL   Basophils Relative 1 %   Basophils Absolute 0.0 0.0 - 0.1 K/uL   Immature Granulocytes 0 %   Abs Immature Granulocytes 0.02 0.00 - 0.07 K/uL    Comment: Performed at [facility], [place] [address], [place], [place] [id number]  Basic metabolic panel     Status: Abnormal   Collection Time: 09/17/18  8:03 AM  Result Value Ref Range   Sodium 142 135 - 145 mmol/L   Potassium 3.2 (L) 3.5 - 5.1 mmol/L   Chloride 107 98 - 111 mmol/L   CO2 24 22 - 32 mmol/L   Glucose, Bld 133 (H) 70 - 99 mg/dL   BUN 15 8 - 23 mg/dL   Creatinine, Ser 0.85 0.44 - 1.00 mg/dL   Calcium 8.7 (L) 8.9 - 10.3 mg/dL   GFR calc non Af Amer >60 >60 mL/min   GFR calc Af Amer >60 >60 mL/min   Anion gap 11 5 - 15    Comment: Performed at [facility], [place] [address] [place], [place] [address]  SARS Coronavirus 2 [facility] [place] order, Performed in [facility] hospital lab) Nasopharyngeal Nasopharyngeal Swab     Status: None   Collection Time: 09/17/18  6:12 PM   Specimen: Nasopharyngeal Swab  Result Value Ref Range   SARS Coronavirus 2 NEGATIVE NEGATIVE    Comment: (NOTE) If result is NEGATIVE SARS-CoV-2 target nucleic acids are NOT DETECTED. The SARS-CoV-2 RNA is generally detectable in upper and lower  respiratory specimens during the acute phase of infection. The lowest  concentration of SARS-CoV-2 viral copies this assay can detect is 250  copies /  mL. A negative result does not preclude SARS-CoV-2 infection  and should not be used as the sole basis for treatment or other  patient  management decisions.  A negative result may occur with  improper specimen collection / handling, submission of specimen other  than nasopharyngeal swab, presence of viral mutation(s) within the  areas targeted by this assay, and inadequate number of viral copies  (<250 copies / mL). A negative result must be combined with clinical  observations, patient history, and epidemiological information. If result is POSITIVE SARS-CoV-2 target nucleic acids are DETECTED. The SARS-CoV-2 RNA is generally detectable in upper and lower  respiratory specimens dur ing the acute phase of infection.  Positive  results are indicative of active infection with SARS-CoV-2.  Clinical  correlation with patient history and other diagnostic information is  necessary to determine patient infection status.  Positive results do  not rule out bacterial infection or co-infection with other viruses. If result is PRESUMPTIVE POSTIVE SARS-CoV-2 nucleic acids MAY BE PRESENT.   A presumptive positive result was obtained on the submitted specimen  and confirmed on repeat testing.  While 2019 novel coronavirus  (SARS-CoV-2) nucleic acids may be present in the submitted sample  additional confirmatory testing may be necessary for epidemiological  and / or clinical management purposes  to differentiate between  SARS-CoV-2 and other Sarbecovirus currently known to infect humans.  If clinically indicated additional testing with an alternate test  methodology [phone]) is advised. The SARS-CoV-2 RNA is generally  detectable in upper and lower respiratory sp ecimens during the acute  phase of infection. The expected result is Negative. Fact Sheet for Patients:  StrictlyIdeas.no Fact Sheet for Healthcare Providers: BankingDealers.co.za This test is  not yet approved or cleared by the [place] FDA and has been authorized for detection and/or diagnosis of SARS-CoV-2 by FDA under an Emergency Use Authorization (EUA).  This EUA will remain in effect (meaning this test can be used) for the duration of the COVID-19 declaration under Section 564(b)(1) of the Act, 21 U.S.C. section 360bbb-3(b)(1), unless the authorization is terminated or revoked sooner. Performed at [facility], [place] [address], [place], [place] [address]    Ct Head Wo Contrast  Result Date: 09/17/2018 CLINICAL DATA:  Head trauma secondary to a fall out of bed this morning. Scalp hematoma over the left frontal bone. EXAM: CT HEAD WITHOUT CONTRAST TECHNIQUE: Contiguous axial images were obtained from the base of the skull through the vertex without intravenous contrast. COMPARISON:  06/01/2009 FINDINGS: Brain: No evidence of acute infarction, hemorrhage, hydrocephalus, extra-axial collection or mass lesion/mass effect. Vascular: No hyperdense vessel or unexpected calcification. Skull: Normal. Negative for fracture or focal lesion. Sinuses/Orbits: No acute abnormality. Tiny metallic foreign body in the anterior aspect of the left eye, unchanged since 06/01/2009. No acute abnormality of the sinuses. Other: Scalp hematoma over the left frontal bone approximately 2 cm in size. This is adjacent to the lateral aspect of the left superior orbital rim. IMPRESSION: No acute intracranial abnormality. Scalp hematoma over the left frontal bone. Electronically Signed   By: Lorriane Shire M.D.   On: 09/17/2018 10:32   Ct Chest W Contrast  Result Date: 09/17/2018 CLINICAL DATA:  Back pain after a fall out of bed this morning. Head trauma with scalp hematoma. EXAM: CT CHEST WITH CONTRAST TECHNIQUE: Multidetector CT imaging of the chest was performed during intravenous contrast administration. CONTRAST:  10mL OMNIPAQUE IOHEXOL 300 MG/ML  SOLN COMPARISON:  CT scan of the abdomen  and pelvis dated 09/12/2015 FINDINGS: Cardiovascular: Aortic atherosclerosis. Heart size is normal. Small pericardial effusion, chronic. Mediastinum/Nodes: Thyroidectomy. No hilar or mediastinal adenopathy. Esophagus and  trachea appear normal. Lungs/Pleura: 4 mm calcified granuloma in the left upper lobe. Chronic central diaphragmatic hernia. The dome of the right lobe of the liver protrudes through the hernia and compresses the right middle lobe chronically. Minimal scarring at the left lung base. No effusions. Upper Abdomen: Multiple benign-appearing cysts in the liver. Benign cystic lesion in the spleen. Small stable cyst on the upper pole the left kidney. Small progressive cyst in the upper pole the right kidney. Musculoskeletal: No chest wall abnormality. No acute or significant osseous findings. IMPRESSION: 1. No acute abnormalities. 2. Chronic right central diaphragmatic hernia containing the dome of the right lobe of the liver. Aortic Atherosclerosis (ICD10-I70.0). Electronically Signed   By: Lorriane Shire M.D.   On: 09/17/2018 10:47   Ct Cervical Spine Wo Contrast  Result Date: 09/17/2018 CLINICAL DATA:  Head trauma secondary to a fall out of bed this morning. Scalp hematoma. EXAM: CT CERVICAL SPINE WITHOUT CONTRAST TECHNIQUE: Multidetector CT imaging of the cervical spine was performed without intravenous contrast. Multiplanar CT image reconstructions were also generated. COMPARISON:  None. FINDINGS: Alignment: Normal. Skull base and vertebrae: No acute fracture. No primary bone lesion or focal pathologic process. Soft tissues and spinal canal: No prevertebral fluid or swelling. No visible canal hematoma. Disc levels: C2-3: Moderate right facet arthritis. Slight right foraminal narrowing. C3-4: Small disc bulge to the left of midline. C4-5: Moderate left facet arthritis. C5-6: Disc space narrowing with broad-based endplate osteophyte from the inferior endplate of C5 with slight narrowing of both  neural foramina. C6-7: Slight disc space narrowing. C7-T1: Negative. Upper chest: Negative. Other: None. IMPRESSION: No acute abnormality of the cervical spine. Multilevel degenerative disc and joint disease. Electronically Signed   By: Lorriane Shire M.D.   On: 09/17/2018 10:39   Ct Thoracic Spine Wo Contrast  Result Date: 09/17/2018 CLINICAL DATA:  Back pain and paresthesias and bilateral upper extremity weakness since a fall out of bed this morning. Hematoma on the left side of the forehead. EXAM: CT THORACIC SPINE WITHOUT CONTRAST TECHNIQUE: Multidetector CT images of the thoracic were obtained using the standard protocol without intravenous contrast. COMPARISON:  None. FINDINGS: Alignment: Normal. Vertebrae: No acute fracture or focal pathologic process. Paraspinal and other soft tissues: Negative. Disc levels: No visible disc protrusions. No spinal stenosis. Degenerative disc disease primarily at T7-8, T9-10, and T10-11. Right facet arthritis at T11-12. IMPRESSION: No acute abnormality of the thoracic spine. Multilevel degenerative disc disease. Electronically Signed   By: Lorriane Shire M.D.   On: 09/17/2018 10:28   Mr Cervical Spine Wo Contrast  Result Date: 09/17/2018 CLINICAL DATA:  Head trauma after falling out of bed this morning. EXAM: MRI CERVICAL SPINE WITHOUT CONTRAST TECHNIQUE: Multiplanar, multisequence MR imaging of the cervical spine was performed. No intravenous contrast was administered. COMPARISON:  CT scan dated 09/17/2018 and 06/02/2011 FINDINGS: Alignment: Minimal chronic retrolisthesis of C5 and C6, unchanged since 2013. Vertebrae: No fracture or bone destruction. Cord: Disc protrusion at C3-4 slightly indents the ventral aspect of the left side of the spinal cord without myelopathy. Retrolisthesis and endplate osteophytes compress the spinal cord to an AP dimension of 6 mm at C5-6 without myelopathy. Posterior Fossa, vertebral arteries, paraspinal tissues: There is a tiny amount  of prevertebral fluid at C3, C4 and C5 seen on series 6. No other soft tissue edema in the neck. No mass lesions. Disc levels: C2-3: Normal disc.  Moderate right facet arthritis. C3-4: Small soft disc protrusion to the left of midline indenting  the ventral aspect of the spinal cord without myelopathy. No foraminal stenosis. C4-5: Slight left facet arthritis.  Otherwise negative. C5-6: Chronic disc space narrowing and slight retrolisthesis. Narrowing of the AP dimension of the spinal canal with slight compression of the spinal cord without myelopathy. Bilateral foraminal stenosis. C6-7: Degenerative changes of the vertebral endplates. Tiny disc bulges into the neural foramen without foraminal stenosis. C7-T1: Slight right facet arthritis.  Otherwise negative. IMPRESSION: 1. Cervical spinal stenosis at C5-6 with slight compression of the spinal cord but without myelopathy. 2. Soft disc protrusion at C3-4 to the left with slight compression of the spinal cord without myelopathy. 3. No fracture. There is a small amount of prevertebral fluid from C3-C5 which is nonspecific. I see no evidence of other soft tissue injury. Electronically Signed   By: Lorriane Shire M.D.   On: 09/17/2018 14:24   Dg Cerv Spine Flex&ext Only  Result Date: 09/17/2018 CLINICAL DATA:  Head trauma after fall and of the bed this morning. Small amount of prevertebral edema or fluid seen on MRI scan today. EXAM: CERVICAL SPINE - FLEXION AND EXTENSION VIEWS ONLY COMPARISON:  None. FINDINGS: There is slight reversal of the normal cervical lordosis. There is no abnormal subluxation with flexion or extension. Motion in the mid to lower cervical spine is quite limited. IMPRESSION: No abnormal subluxation with flexion or extension. Electronically Signed   By: Lorriane Shire M.D.   On: 09/17/2018 16:05   Ct Maxillofacial Wo Contrast  Result Date: 09/17/2018 CLINICAL DATA:  Facial trauma secondary to a fall out of bed this morning. Scalp hematoma  over the left frontal bone. EXAM: CT MAXILLOFACIAL WITHOUT CONTRAST TECHNIQUE: Multidetector CT imaging of the maxillofacial structures was performed. Multiplanar CT image reconstructions were also generated. COMPARISON:  CT scan of the head dated 06/01/2009 FINDINGS: Osseous: There is no fracture. No destructive process. Orbits: Tiny metallic foreign body in the anterior chamber of the left eye, unchanged. Sinuses: Minimal mucosal thickening of the left maxillary sinus. Otherwise normal. Soft tissues: Scalp hematoma and scalp contusion over the left superior orbital rim. No adjacent fracture. Limited intracranial: Normal. IMPRESSION: 1. No acute abnormality of the maxillofacial structures. Scalp hematoma and contusion over the left superior orbital rim. 2. Tiny metallic foreign body in the anterior chamber of the left eye, unchanged. Electronically Signed   By: Lorriane Shire M.D.   On: 09/17/2018 10:36    Pending Labs [employer] (From admission, onward)    Start     Ordered   Signed and Held  CBC  (enoxaparin (LOVENOX)    CrCl >/= 30 ml/min)  Once,   R    Comments: Baseline for enoxaparin therapy IF NOT ALREADY DRAWN.  Notify MD if PLT < 100 K.    Signed and Held   Signed and Held  Creatinine, serum  (enoxaparin (LOVENOX)    CrCl >/= 30 ml/min)  Once,   R    Comments: Baseline for enoxaparin therapy IF NOT ALREADY DRAWN.    Signed and Held   Signed and Held  Creatinine, serum  (enoxaparin (LOVENOX)    CrCl >/= 30 ml/min)  Weekly,   R    Comments: while on enoxaparin therapy    Signed and Held   Signed and Held  Comprehensive metabolic panel  Tomorrow morning,   R     Signed and Held   Signed and Held  CBC  Tomorrow morning,   R     Signed and Held  Signed and Held  Protime-INR  Tomorrow morning,   R     Signed and Held          Vitals/Pain Today's Vitals   09/17/18 1605 09/17/18 1900 09/17/18 1930 09/17/18 1945  BP: (!) 159/72 (!) 191/81 (!) 172/86 (!) 172/86  Pulse: 60 72   60  Resp: 18 19  19   Temp:      TempSrc:      SpO2: 97% 98%  100%  Weight:      Height:      PainSc:        Isolation Precautions No active isolations  Medications Medications  sodium chloride (PF) 0.9 % injection (has no administration in time range)  dexamethasone (DECADRON) injection 4 mg (4 mg Intravenous Given 09/17/18 1518)  gabapentin (NEURONTIN) capsule 100 mg (100 mg Oral Given 09/17/18 1659)  acetaminophen (TYLENOL) tablet 1,000 mg (1,000 mg Oral Given 09/17/18 1811)  potassium chloride SA (K-DUR) CR tablet 40 mEq (40 mEq Oral Given 09/17/18 1900)  morphine 2 MG/ML injection 2 mg (2 mg Intravenous Given 09/17/18 1900)  morphine 4 MG/ML injection 4 mg (4 mg Intravenous Given 09/17/18 0812)  acetaminophen (TYLENOL) tablet 1,000 mg (1,000 mg Oral Given 09/17/18 0812)  ondansetron (ZOFRAN) injection 4 mg (4 mg Intravenous Given 09/17/18 0812)  iohexol (OMNIPAQUE) 300 MG/ML solution 75 mL (75 mLs Intravenous Contrast Given 09/17/18 0939)  morphine 4 MG/ML injection 4 mg (4 mg Intravenous Given 09/17/18 1119)    Mobility walks High fall risk      R Recommendations: See Admitting Provider Note  Report given to: [place] [facility]

## 2018-09-17 NOTE — ED Provider Notes (Signed)
[place] DEPT Provider Note   CSN: [id number] Arrival date & time: 09/17/18  0720     History   Chief Complaint Chief Complaint  Patient presents with   Head Injury   Fall    HPI Yvonne Tran is a 84 y.o. female.     84 yo F with a chief complaint of falling out of bed.  The patient is unsure why exactly she rolled out of bed.  When she did she landed on her face.  She is complaining of head neck and bilateral upper extremity pain.  She has a sharp and shooting pain to both arms and hands.  Has pain to her neck and her back.  She is unsure exactly why she fell.  She denies chest or abdominal pain.  Denies lower extremity pain.  The history is provided by the patient and the spouse.  Head Injury Location:  Frontal Time since incident:  2 hours Mechanism of injury: fall   Fall:    Fall occurred:  From a bed   Height of fall:  3   Impact surface:  Hard floor   Point of impact:  Head   Entrapped after fall: no   Pain details:    Radiates to:  Face   Severity:  Moderate   Duration:  2 days   Timing:  Constant   Progression:  Worsening Chronicity:  New Relieved by:  Nothing Worsened by:  Nothing Ineffective treatments:  None tried Associated symptoms: headache and neck pain   Associated symptoms: no nausea and no vomiting   Fall Associated symptoms include headaches. Pertinent negatives include no chest pain and no shortness of breath.    Past Medical History:  Diagnosis Date   Cardiac angina ([place])    Glaucoma    GLAUCOMA IN   BOTH EYES:  LEGALLY BLIND IN RIGHT EYE   Goiter    THROID GOITER WITH COMPRESSION--PT HAVING TROUBLE SWALLOWING AND SOB   Headache(784.0)    LEFT TEMPORAL AREA   Heart attack ([place])    30 YRS AGO  DR VARANASI  EAGLE CARD   Hyperlipidemia    Hypertension    Lipoma    Pain    RIGHT THIGH FOR QUITE SOME TIME--LEG GIVES AWAY SOMETIMES.  LOWER BACK PAIN  - S/P HX OF PREVIOUS BACK  SURGERY   Peripheral vascular disease ([place])    09/2008 STENT LEFT EXTERNAL ILIAC;  03/20/10 RE-STENT OF LEFT EXTERNAL ILIAC STENT- AT [facility] WITH DR. VARANASI   Recurrent upper respiratory infection (URI) MAY 2013   RECENT COLD    Shortness of breath    WITH EXERTION    Stroke [facility])    MINI STROKE  YRS AGO     Patient Active Problem List   Diagnosis Date Noted   PAD (peripheral artery disease) ([place]) 02/14/2014   Mixed hyperlipidemia 02/10/2013   Pain in limb 02/10/2013   Essential hypertension, benign 02/10/2013   Cough 10/07/2012   Hypothyroidism, postsurgical 09/08/2011   Multinodular goiter (nontoxic) 07/08/2011   Abdominal wall mass of left upper quadrant, 4 x 5 cm in anterior axillary line. 11/10/2010    Past Surgical History:  Procedure Laterality Date   APPENDECTOMY     BACK SURGERY  1988   PT STATES AFTER THE BACK SURGERY PT HAS PARALYSIS LEFT ARM AND LEG--TOOK 2&1/2 YEARS TO REGAIN USE OF LEFT SIDE--LEFT SIDE REMAINS WEAKER THAN HER RIFHT SIDE.   CARDIAC CATHETERIZATION  2012   EYE SURGERY  left eye, lens replacement   MASS EXCISION  01/13/2011   Procedure: EXCISION MASS;  Surgeon: Stark Klein, MD;  Location: [facility];  Service: General;  Laterality: Left;  Left abdominal wall mass (subcutaneous, 4-5 cm).   THYROIDECTOMY  08/14/2011   Procedure: THYROIDECTOMY;  Surgeon: Earnstine Regal, MD;  Location: [facility];  Service: General;  Laterality: N/A;  Total Thyroidectomy   VASCULAR SURGERY     STENT PLACED 03/2010 RT LEG     OB History   No obstetric history on file.      Home Medications    Prior to Admission medications   Medication Sig Start Date End Date Taking? Authorizing Provider  amLODipine (NORVASC) 5 MG tablet Take 1 tablet (5 mg total) by mouth daily. 04/21/18  Yes Jettie Booze, MD  aspirin EC 81 MG tablet Take 81 mg by mouth every morning.    Yes [provider]  BIOTIN PO Take 1 tablet by mouth daily.   Yes [provider]  COMBIGAN 0.2-0.5 % ophthalmic solution Place 1 drop into the right eye 2 (two) times daily. 02/12/17  Yes [provider]  latanoprost (XALATAN) 0.005 % ophthalmic solution Place 1 drop into both eyes at bedtime.   Yes [provider]  levothyroxine (SYNTHROID, LEVOTHROID) 88 MCG tablet Take 88 mcg by mouth daily before breakfast.   Yes [provider]  LUMIGAN 0.01 % SOLN Place 1 drop into the right eye at bedtime. 02/12/17  Yes [provider]  Multiple Vitamins-Minerals (CENTRUM SILVER 50+WOMEN) TABS Take 1 tablet by mouth daily.   Yes [provider]  Netarsudil Dimesylate (RHOPRESSA OP) Place 1 drop into the right eye at bedtime.   Yes [provider]  nitroGLYCERIN (NITROSTAT) 0.4 MG SL tablet Place 1 tablet (0.4 mg total) under the tongue every 5 (five) minutes as needed. For chest pain. 04/21/18  Yes Jettie Booze, MD  vitamin B-12 (CYANOCOBALAMIN) 100 MCG tablet Take 100 mcg by mouth daily.   Yes [provider]    Family History Family History  Problem Relation Age of Onset   Cancer Mother        brain   Stroke Mother    Heart disease Father    Heart attack Father    Hypertension Father    Cancer Brother        prostate   Anesthesia problems Neg Hx    Hypotension Neg Hx    Malignant hyperthermia Neg Hx    Pseudochol deficiency Neg Hx    Breast cancer Neg Hx     Social History Social History   Tobacco Use   Smoking status: Never Smoker   Smokeless tobacco: Never Used  Substance Use Topics   Alcohol use: No   Drug use: No     Allergies   Crestor [rosuvastatin], Eggs or egg-derived products, Lemon juice, Milk-related compounds, Repatha [evolocumab], and Shellfish allergy   Review of Systems Review of Systems  Constitutional: Negative for chills and fever.  HENT: Negative for congestion and rhinorrhea.   Eyes: Negative for redness and visual disturbance.    Respiratory: Negative for shortness of breath and wheezing.   Cardiovascular: Negative for chest pain and palpitations.  Gastrointestinal: Negative for nausea and vomiting.  Genitourinary: Negative for dysuria and urgency.  Musculoskeletal: Positive for back pain and neck pain. Negative for arthralgias and myalgias.  Skin: Negative for pallor and wound.  Neurological: Positive for headaches. Negative for dizziness.  Physical Exam Updated Vital Signs BP 133/68    Pulse (!) 57    Temp 98.6 F (37 C) (Oral)    Resp 19    Ht 5\' 5"  (1.651 m)    Wt 90.7 kg    SpO2 99%    BMI 33.28 kg/m   Physical Exam Vitals signs and nursing note reviewed.  Constitutional:      General: She is not in acute distress.    Appearance: She is well-developed. She is not diaphoretic.  HENT:     Head: Normocephalic.     Comments: Large L frontal hematoma.  Eyes:     Pupils: Pupils are equal, round, and reactive to light.  Neck:     Musculoskeletal: Normal range of motion and neck supple.  Cardiovascular:     Rate and Rhythm: Normal rate and regular rhythm.     Heart sounds: No murmur. No friction rub. No gallop.   Pulmonary:     Effort: Pulmonary effort is normal.     Breath sounds: No wheezing or rales.  Abdominal:     General: There is no distension.     Palpations: Abdomen is soft.     Tenderness: There is no abdominal tenderness.  Musculoskeletal:        General: Tenderness present.     Comments: Diffuse tenderness with palpation of bilateral upper extremities.  Has full range of motion and no identifiable bony tenderness.  Pulse and sensation intact distally.  Patient unwilling or unable to provide motor of the hands.  Intact proximal muscle strength to the upper extremities.  Denies any lower extremity symptoms.  Skin:    General: Skin is warm and dry.  Neurological:     Mental Status: She is alert and oriented to person, place, and time.     Comments: No obvious bony pathology to the upper  extremities.  Severe pain with palpation and movement diffusely.  Pain without stepoff or deformities of the c spine.   Psychiatric:        Behavior: Behavior normal.      ED Treatments / Results  Labs (all labs ordered are listed, but only abnormal results are displayed) Labs Reviewed  CBC WITH DIFFERENTIAL/PLATELET - Abnormal; Notable for the following components:      Result Value   RDW 15.6 (*)    All other components within normal limits  BASIC METABOLIC PANEL - Abnormal; Notable for the following components:   Potassium 3.2 (*)    Glucose, Bld 133 (*)    Calcium 8.7 (*)    All other components within normal limits    EKG None  Radiology Ct Head Wo Contrast  Result Date: 09/17/2018 CLINICAL DATA:  Head trauma secondary to a fall out of bed this morning. Scalp hematoma over the left frontal bone. EXAM: CT HEAD WITHOUT CONTRAST TECHNIQUE: Contiguous axial images were obtained from the base of the skull through the vertex without intravenous contrast. COMPARISON:  06/01/2009 FINDINGS: Brain: No evidence of acute infarction, hemorrhage, hydrocephalus, extra-axial collection or mass lesion/mass effect. Vascular: No hyperdense vessel or unexpected calcification. Skull: Normal. Negative for fracture or focal lesion. Sinuses/Orbits: No acute abnormality. Tiny metallic foreign body in the anterior aspect of the left eye, unchanged since 06/01/2009. No acute abnormality of the sinuses. Other: Scalp hematoma over the left frontal bone approximately 2 cm in size. This is adjacent to the lateral aspect of the left superior orbital rim. IMPRESSION: No acute intracranial abnormality. Scalp hematoma over the left  frontal bone. Electronically Signed   By: Lorriane Shire M.D.   On: 09/17/2018 10:32   Ct Chest W Contrast  Result Date: 09/17/2018 CLINICAL DATA:  Back pain after a fall out of bed this morning. Head trauma with scalp hematoma. EXAM: CT CHEST WITH CONTRAST TECHNIQUE: Multidetector CT  imaging of the chest was performed during intravenous contrast administration. CONTRAST:  68mL OMNIPAQUE IOHEXOL 300 MG/ML  SOLN COMPARISON:  CT scan of the abdomen and pelvis dated 09/12/2015 FINDINGS: Cardiovascular: Aortic atherosclerosis. Heart size is normal. Small pericardial effusion, chronic. Mediastinum/Nodes: Thyroidectomy. No hilar or mediastinal adenopathy. Esophagus and trachea appear normal. Lungs/Pleura: 4 mm calcified granuloma in the left upper lobe. Chronic central diaphragmatic hernia. The dome of the right lobe of the liver protrudes through the hernia and compresses the right middle lobe chronically. Minimal scarring at the left lung base. No effusions. Upper Abdomen: Multiple benign-appearing cysts in the liver. Benign cystic lesion in the spleen. Small stable cyst on the upper pole the left kidney. Small progressive cyst in the upper pole the right kidney. Musculoskeletal: No chest wall abnormality. No acute or significant osseous findings. IMPRESSION: 1. No acute abnormalities. 2. Chronic right central diaphragmatic hernia containing the dome of the right lobe of the liver. Aortic Atherosclerosis (ICD10-I70.0). Electronically Signed   By: Lorriane Shire M.D.   On: 09/17/2018 10:47   Ct Cervical Spine Wo Contrast  Result Date: 09/17/2018 CLINICAL DATA:  Head trauma secondary to a fall out of bed this morning. Scalp hematoma. EXAM: CT CERVICAL SPINE WITHOUT CONTRAST TECHNIQUE: Multidetector CT imaging of the cervical spine was performed without intravenous contrast. Multiplanar CT image reconstructions were also generated. COMPARISON:  None. FINDINGS: Alignment: Normal. Skull base and vertebrae: No acute fracture. No primary bone lesion or focal pathologic process. Soft tissues and spinal canal: No prevertebral fluid or swelling. No visible canal hematoma. Disc levels: C2-3: Moderate right facet arthritis. Slight right foraminal narrowing. C3-4: Small disc bulge to the left of midline.  C4-5: Moderate left facet arthritis. C5-6: Disc space narrowing with broad-based endplate osteophyte from the inferior endplate of C5 with slight narrowing of both neural foramina. C6-7: Slight disc space narrowing. C7-T1: Negative. Upper chest: Negative. Other: None. IMPRESSION: No acute abnormality of the cervical spine. Multilevel degenerative disc and joint disease. Electronically Signed   By: Lorriane Shire M.D.   On: 09/17/2018 10:39   Ct Thoracic Spine Wo Contrast  Result Date: 09/17/2018 CLINICAL DATA:  Back pain and paresthesias and bilateral upper extremity weakness since a fall out of bed this morning. Hematoma on the left side of the forehead. EXAM: CT THORACIC SPINE WITHOUT CONTRAST TECHNIQUE: Multidetector CT images of the thoracic were obtained using the standard protocol without intravenous contrast. COMPARISON:  None. FINDINGS: Alignment: Normal. Vertebrae: No acute fracture or focal pathologic process. Paraspinal and other soft tissues: Negative. Disc levels: No visible disc protrusions. No spinal stenosis. Degenerative disc disease primarily at T7-8, T9-10, and T10-11. Right facet arthritis at T11-12. IMPRESSION: No acute abnormality of the thoracic spine. Multilevel degenerative disc disease. Electronically Signed   By: Lorriane Shire M.D.   On: 09/17/2018 10:28   Mr Cervical Spine Wo Contrast  Result Date: 09/17/2018 CLINICAL DATA:  Head trauma after falling out of bed this morning. EXAM: MRI CERVICAL SPINE WITHOUT CONTRAST TECHNIQUE: Multiplanar, multisequence MR imaging of the cervical spine was performed. No intravenous contrast was administered. COMPARISON:  CT scan dated 09/17/2018 and 06/02/2011 FINDINGS: Alignment: Minimal chronic retrolisthesis of C5 and C6,  unchanged since 2013. Vertebrae: No fracture or bone destruction. Cord: Disc protrusion at C3-4 slightly indents the ventral aspect of the left side of the spinal cord without myelopathy. Retrolisthesis and endplate  osteophytes compress the spinal cord to an AP dimension of 6 mm at C5-6 without myelopathy. Posterior Fossa, vertebral arteries, paraspinal tissues: There is a tiny amount of prevertebral fluid at C3, C4 and C5 seen on series 6. No other soft tissue edema in the neck. No mass lesions. Disc levels: C2-3: Normal disc.  Moderate right facet arthritis. C3-4: Small soft disc protrusion to the left of midline indenting the ventral aspect of the spinal cord without myelopathy. No foraminal stenosis. C4-5: Slight left facet arthritis.  Otherwise negative. C5-6: Chronic disc space narrowing and slight retrolisthesis. Narrowing of the AP dimension of the spinal canal with slight compression of the spinal cord without myelopathy. Bilateral foraminal stenosis. C6-7: Degenerative changes of the vertebral endplates. Tiny disc bulges into the neural foramen without foraminal stenosis. C7-T1: Slight right facet arthritis.  Otherwise negative. IMPRESSION: 1. Cervical spinal stenosis at C5-6 with slight compression of the spinal cord but without myelopathy. 2. Soft disc protrusion at C3-4 to the left with slight compression of the spinal cord without myelopathy. 3. No fracture. There is a small amount of prevertebral fluid from C3-C5 which is nonspecific. I see no evidence of other soft tissue injury. Electronically Signed   By: Lorriane Shire M.D.   On: 09/17/2018 14:24   Ct Maxillofacial Wo Contrast  Result Date: 09/17/2018 CLINICAL DATA:  Facial trauma secondary to a fall out of bed this morning. Scalp hematoma over the left frontal bone. EXAM: CT MAXILLOFACIAL WITHOUT CONTRAST TECHNIQUE: Multidetector CT imaging of the maxillofacial structures was performed. Multiplanar CT image reconstructions were also generated. COMPARISON:  CT scan of the head dated 06/01/2009 FINDINGS: Osseous: There is no fracture. No destructive process. Orbits: Tiny metallic foreign body in the anterior chamber of the left eye, unchanged. Sinuses:  Minimal mucosal thickening of the left maxillary sinus. Otherwise normal. Soft tissues: Scalp hematoma and scalp contusion over the left superior orbital rim. No adjacent fracture. Limited intracranial: Normal. IMPRESSION: 1. No acute abnormality of the maxillofacial structures. Scalp hematoma and contusion over the left superior orbital rim. 2. Tiny metallic foreign body in the anterior chamber of the left eye, unchanged. Electronically Signed   By: Lorriane Shire M.D.   On: 09/17/2018 10:36    Procedures Procedures (including critical care time)  Medications Ordered in ED Medications  sodium chloride (PF) 0.9 % injection (has no administration in time range)  dexamethasone (DECADRON) injection 4 mg (has no administration in time range)  gabapentin (NEURONTIN) capsule 100 mg (has no administration in time range)  morphine 4 MG/ML injection 4 mg (4 mg Intravenous Given 09/17/18 0812)  acetaminophen (TYLENOL) tablet 1,000 mg (1,000 mg Oral Given 09/17/18 0812)  ondansetron (ZOFRAN) injection 4 mg (4 mg Intravenous Given 09/17/18 0812)  iohexol (OMNIPAQUE) 300 MG/ML solution 75 mL (75 mLs Intravenous Contrast Given 09/17/18 0939)  morphine 4 MG/ML injection 4 mg (4 mg Intravenous Given 09/17/18 1119)     Initial Impression / Assessment and Plan / ED Course  I have reviewed the triage vital signs and the nursing notes.  Pertinent labs & imaging results that were available during my care of the patient were reviewed by me and considered in my medical decision making (see chart for details).        84 yo F with a chief  complaints of a fall out of bed.  Patient's chief complaint is really searing bilateral upper extremity pain and weakness.  This is concerning for central cord syndrome.  Will obtain a CT scan of the head face C-spine and T-spine.  Basic labs.  Placed in a c-collar.  Reassess.  Patient continuing to have severe bilateral arm pain.  CT read as negative.  I discussed the case with  the radiologist who again reviewed her images of her C-spine.  No fracture or misalignments or other concerning signs for cord compression.  I discussed my concerns and MRI of the C-spine would be the most helpful test however the patient has an embedded foreign body in 1 of her eyes.  He evaluated this with another radiologist and felt that this was likely a glaucoma device.  He felt that this should be safe for MRI.   MRI is with some stenosis at C5-6.  I discussed the case with Dr. Vertell Limber, neurosurgery.  He felt if she was continuing to have pain then it would be reasonable to keep her in the hospital.  Started on Decadron and gabapentin.  He recommended hospitalist admission and he would come evaluate the patient while she was in the hospital.   CRITICAL CARE Performed by: Cecilio Asper   Total critical care time: 35 minutes  Critical care time was exclusive of separately billable procedures and treating other patients.  Critical care was necessary to treat or prevent imminent or life-threatening deterioration.  Critical care was time spent personally by me on the following activities: development of treatment plan with patient and/or surrogate as well as nursing, discussions with consultants, evaluation of patient's response to treatment, examination of patient, obtaining history from patient or surrogate, ordering and performing treatments and interventions, ordering and review of laboratory studies, ordering and review of radiographic studies, pulse oximetry and re-evaluation of patient's condition.  The patients results and plan were reviewed and discussed.   Any x-rays performed were independently reviewed by myself.   Differential diagnosis were considered with the presenting HPI.  Medications  sodium chloride (PF) 0.9 % injection (has no administration in time range)  dexamethasone (DECADRON) injection 4 mg (has no administration in time range)  gabapentin (NEURONTIN)  capsule 100 mg (has no administration in time range)  morphine 4 MG/ML injection 4 mg (4 mg Intravenous Given 09/17/18 0812)  acetaminophen (TYLENOL) tablet 1,000 mg (1,000 mg Oral Given 09/17/18 0812)  ondansetron (ZOFRAN) injection 4 mg (4 mg Intravenous Given 09/17/18 0812)  iohexol (OMNIPAQUE) 300 MG/ML solution 75 mL (75 mLs Intravenous Contrast Given 09/17/18 0939)  morphine 4 MG/ML injection 4 mg (4 mg Intravenous Given 09/17/18 1119)    Vitals:   09/17/18 0729 09/17/18 1115 09/17/18 1430  BP: (!) 238/94 (!) 146/80 133/68  Pulse: 87 60 (!) 57  Resp: 15 19   Temp: 98.4 F (36.9 C) 98.6 F (37 C)   TempSrc: Oral Oral   SpO2: 99% 98% 99%  Weight: 90.7 kg    Height: 5\' 5"  (1.651 m)      Final diagnoses:  Central cord syndrome at C5 level of cervical spinal cord, initial encounter [facility])    Admission/ observation were discussed with the admitting physician, patient and/or family and they are comfortable with the plan.    Final Clinical Impressions(s) / ED Diagnoses   Final diagnoses:  Central cord syndrome at C5 level of cervical spinal cord, initial encounter [facility])    ED Discharge Orders  None       Deno Etienne, DO 09/17/18 1516

## 2018-09-17 NOTE — ED Triage Notes (Signed)
Pt family states that patient fell out of bed this morning. Pt has hematoma on left lateral forehead. Denies LOC or taking blood thinners.

## 2018-09-17 NOTE — ED Notes (Signed)
Called report to Catharine on 3W

## 2018-09-18 ENCOUNTER — Other Ambulatory Visit: Payer: Self-pay

## 2018-09-18 ENCOUNTER — Encounter (HOSPITAL_COMMUNITY): Payer: Self-pay | Admitting: *Deleted

## 2018-09-18 DIAGNOSIS — E782 Mixed hyperlipidemia: Secondary | ICD-10-CM

## 2018-09-18 DIAGNOSIS — I10 Essential (primary) hypertension: Secondary | ICD-10-CM

## 2018-09-18 DIAGNOSIS — E890 Postprocedural hypothyroidism: Secondary | ICD-10-CM

## 2018-09-18 LAB — COMPREHENSIVE METABOLIC PANEL
ALT: 22 U/L (ref 0–44)
AST: 32 U/L (ref 15–41)
Albumin: 3.6 g/dL (ref 3.5–5.0)
Alkaline Phosphatase: 74 U/L (ref 38–126)
Anion gap: 10 (ref 5–15)
BUN: 14 mg/dL (ref 8–23)
CO2: 25 mmol/L (ref 22–32)
Calcium: 9 mg/dL (ref 8.9–10.3)
Chloride: 104 mmol/L (ref 98–111)
Creatinine, Ser: 0.73 mg/dL (ref 0.44–1.00)
GFR calc Af Amer: >60 mL/min (ref >60)
GFR calc non Af Amer: >60 mL/min (ref >60)
Glucose, Bld: 151 mg/dL — ABNORMAL HIGH (ref 70–99)
Potassium: 4.2 mmol/L (ref 3.5–5.1)
Sodium: 139 mmol/L (ref 135–145)
Total Bilirubin: 0.4 mg/dL (ref 0.3–1.2)
Total Protein: 7.1 g/dL (ref 6.5–8.1)

## 2018-09-18 LAB — PROTIME-INR
INR: 1.1 (ref 0.8–1.2)
Prothrombin Time: 13.8 seconds (ref 11.4–15.2)

## 2018-09-18 LAB — CBC
HCT: 42.3 % (ref 36.0–46.0)
Hemoglobin: 13.5 g/dL (ref 12.0–15.0)
MCH: 28.2 pg (ref 26.0–34.0)
MCHC: 31.9 g/dL (ref 30.0–36.0)
MCV: 88.5 fL (ref 80.0–100.0)
Platelets: 250 10*3/uL (ref 150–400)
RBC: 4.78 MIL/uL (ref 3.87–5.11)
RDW: 15.5 % (ref 11.5–15.5)
WBC: 5.6 10*3/uL (ref 4.0–10.5)
nRBC: 0 % (ref 0.0–0.2)

## 2018-09-18 MED ORDER — GABAPENTIN 300 MG PO CAPS
300.0000 mg | ORAL_CAPSULE | Freq: Three times a day (TID) | ORAL | Status: DC
  Administered 2018-09-18 – 2018-09-21 (×10): 300 mg via ORAL
  Filled 2018-09-18 (×10): qty 1

## 2018-09-18 MED ORDER — HYDRALAZINE HCL 20 MG/ML IJ SOLN
10.0000 mg | Freq: Four times a day (QID) | INTRAMUSCULAR | Status: DC | PRN
  Administered 2018-09-18: 10 mg via INTRAVENOUS
  Filled 2018-09-18: qty 1

## 2018-09-18 MED ORDER — AMLODIPINE BESYLATE 5 MG PO TABS: 5.0000 mg | ORAL_TABLET | Freq: Once | ORAL | Status: AC

## 2018-09-18 MED ORDER — ASPIRIN EC 81 MG PO TBEC
81.0000 mg | DELAYED_RELEASE_TABLET | Freq: Every morning | ORAL | Status: DC
  Administered 2018-09-18 – 2018-09-21 (×4): 81 mg via ORAL
  Filled 2018-09-18 (×4): qty 1

## 2018-09-18 MED ORDER — ACETAMINOPHEN 500 MG PO TABS
1000.0000 mg | ORAL_TABLET | Freq: Four times a day (QID) | ORAL | Status: DC | PRN
  Administered 2018-09-18 – 2018-09-21 (×4): 1000 mg via ORAL
  Filled 2018-09-18 (×4): qty 2

## 2018-09-18 MED ORDER — HYDROCODONE-ACETAMINOPHEN 5-325 MG PO TABS
1.0000 | ORAL_TABLET | ORAL | Status: DC | PRN
  Administered 2018-09-19 – 2018-09-21 (×3): 1 via ORAL
  Filled 2018-09-18 (×3): qty 1

## 2018-09-18 MED ORDER — AMLODIPINE BESYLATE 10 MG PO TABS
10.0000 mg | ORAL_TABLET | Freq: Every day | ORAL | Status: DC
  Administered 2018-09-19 – 2018-09-21 (×3): 10 mg via ORAL
  Filled 2018-09-18 (×3): qty 1

## 2018-09-18 NOTE — Evaluation (Signed)
Physical Therapy Evaluation Patient Details Name: Yvonne Tran MRN: [id number] DOB: 1934-11-03 Today's Date: 09/18/2018   History of Present Illness  84 yo female admitted after falling at home. Dx: central cord syndrome with bil UE paresthesia and weakness. MRI (+) C5-C6 stenosis, C3-C4 disc protrusion, both with slight compression of spinal cord  Clinical Impression  On eval, pt required Min assist +2 for safety. She walked ~15 feet using a RW. Pt continues to experience bil UE paresthesia and weakness. She also exhibits decreased activity tolerance and impaired gait and balance. Discussed d/c plan-pt is agreeable to rehab. Will follow during hospital stay.     Follow Up Recommendations SNF    Equipment Recommendations  None recommended by PT    Recommendations for Other Services       Precautions / Restrictions Precautions Precautions: Fall Precaution Comments: L side weakness/foot drop (at baseline) Restrictions Weight Bearing Restrictions: No      Mobility  Bed Mobility Overal bed mobility: Needs Assistance Bed Mobility: Sidelying to Sit   Sidelying to sit: Min assist;HOB elevated       General bed mobility comments: Assist for LEs and trunk. Increased time.  Transfers Overall transfer level: Needs assistance Equipment used: Rolling walker (2 wheeled) Transfers: Sit to/from Stand Sit to Stand: Mod assist;From elevated surface         General transfer comment: Assist to rise, stabilize. VCs safety, technique, hand placement.  Ambulation/Gait Ambulation/Gait assistance: Min assist;+2 safety/equipment Gait Distance (Feet): 15 Feet Assistive device: Rolling walker (2 wheeled) Gait Pattern/deviations: Step-through pattern;Decreased stride length     General Gait Details: Assist to stabilize pt throughut distance. Unsteady. Pt was fatigued after short walk.  Stairs            Wheelchair Mobility    Modified Rankin (Stroke Patients  Only)       Balance Overall balance assessment: Needs assistance;History of Falls         Standing balance support: Bilateral upper extremity supported Standing balance-Leahy Scale: Poor                               Pertinent Vitals/Pain Pain Assessment: 0-10 Pain Score: 8  Pain Location: bil hands/UEs Pain Descriptors / Indicators: Burning;Discomfort Pain Intervention(s): Monitored during session;Repositioned    Home Living Family/patient expects to be discharged to:: Unsure Living Arrangements: Alone   Type of Home: Apartment Home Access: Level entry     Home Layout: One level Home Equipment: Walker - 2 wheels;Cane - single point      Prior Function Level of Independence: Independent with assistive device(s)         Comments: uses RW vs cane for ambulation     Hand Dominance        Extremity/Trunk Assessment   Upper Extremity Assessment Upper Extremity Assessment: Defer to OT evaluation;LUE deficits/detail LUE Deficits / Details: hip flexion 3+/5, elbow extension 3+/5, fair grip LUE Coordination: decreased fine motor    Lower Extremity Assessment Lower Extremity Assessment: LLE deficits/detail LLE Deficits / Details: L foot drop (chronic)    Cervical / Trunk Assessment Cervical / Trunk Assessment: Normal  Communication   Communication: No difficulties  Cognition Arousal/Alertness: Awake/alert Behavior During Therapy: WFL for tasks assessed/performed Overall Cognitive Status: Within Functional Limits for tasks assessed  General Comments      Exercises     Assessment/Plan    PT Assessment Patient needs continued PT services  PT Problem List Decreased strength;Decreased mobility;Decreased range of motion;Decreased activity tolerance;Decreased balance;Decreased knowledge of use of DME;Pain       PT Treatment Interventions DME instruction;Gait training;Therapeutic  exercise;Therapeutic activities;Patient/family education;Balance training;Functional mobility training    PT Goals (Current goals can be found in the Care Plan section)  Acute Rehab PT Goals Patient Stated Goal: less pain PT Goal Formulation: With patient Time For Goal Achievement: 10/02/18 Potential to Achieve Goals: Good    Frequency Min 3X/week   Barriers to discharge        Co-evaluation               AM-PAC PT "6 Clicks" Mobility  Outcome Measure Help needed turning from your back to your side while in a flat bed without using bedrails?: A Little Help needed moving from lying on your back to sitting on the side of a flat bed without using bedrails?: A Little Help needed moving to and from a bed to a chair (including a wheelchair)?: A Little Help needed standing up from a chair using your arms (e.g., wheelchair or bedside chair)?: A Lot Help needed to walk in hospital room?: A Lot Help needed climbing 3-5 steps with a railing? : A Lot 6 Click Score: 15    End of Session Equipment Utilized During Treatment: Gait belt Activity Tolerance: Patient tolerated treatment well Patient left: in chair;with call bell/phone within reach;with family/visitor present   PT Visit Diagnosis: Unsteadiness on feet (R26.81);History of falling (Z91.81);Repeated falls (R29.6);Difficulty in walking, not elsewhere classified (R26.2);Other symptoms and signs involving the nervous system (R29.898);Muscle weakness (generalized) (M62.81);Pain Pain - Right/Left: (bil) Pain - part of body: Leg    Time: 1347-1401 PT Time Calculation (min) (ACUTE ONLY): 14 min   Charges:   PT Evaluation $PT Eval Moderate Complexity: [place], PT Acute Rehabilitation Services Pager: [phone] Office: [phone]

## 2018-09-18 NOTE — Progress Notes (Signed)
[facility]                                                                              Patient Demographics  Yvonne Tran, is a 84 y.o. female, DOB - 1934/12/07, [id number]  Admit date - 09/17/2018   Admitting Physician Nita Sells, MD  Outpatient Primary MD for the patient is Cari Caraway, MD  Outpatient specialists:   LOS - 1  days   Medical records reviewed and are as summarized below:    Chief Complaint  Patient presents with   Head Injury   Fall       Brief summary   Patient is 84 year old female with known history of PVD, ileal stents, CAD, multinodular goiter status post resection 08/2017, hypothyroidism presented to ED after a fall.  Patient has a history of back surgery in 1s at [facility] clinic which failed and had severe inability to move left side of her body, uses walker or cane, contributing to her falls.  Patient presented to ED after found down, apparently accidentally fell out of the bed and landed on her left side of the face, hitting her head and forehead.  Patient reported severe pain in her upper extremities bilaterally, weakness with numbness and tingling Patient was placed in c-collar by EDP. The C-spine showed disc space narrowing with broad-based end plate osteophyte from inferior endplate of C5 with slight narrowing of both neural foramina.  CT chest showed chronic right central diaphragmatic hernia containing the dome of the right lobe of liver otherwise no acute abnormalities CT head showed scalp hematoma over the left frontal bone otherwise no acute intracranial abnormality.  Assessment & Plan    Principal Problem:   Central cord syndrome at C5 level of cervical spinal cord ([place]) with underlying C-spine stenosis -Patient presented after a fall, sustained a central cord injury likely secondary to extension at C5-C6 level. -Presented with pain in the bilateral upper extremities, weakness, numbness and  tingling, headache -Started on Decadron, gabapentin.  Neurosurgery was consulted, seen by Dr. Vertell Limber, recommended surgery after she has improved to decompress the cervical cord at C5-C6 level.  Dr. Vertell Limber recommended surgery in a few weeks with outpatient follow-up versus return to [facility] clinic for surgery. -Increase Neurontin to 300 mg 3 times daily, continue dexamethasone IV x 3 days with taper outpatient, added Norco as needed for moderate pain -PT OT, likely will need STR  Active Problems:   Hypothyroidism, postsurgical -Continue Synthroid 88 MCG daily    Essential hypertension, benign -BP elevated, likely due to pain  -Increase Norvasc to 10 mg daily, will give extra dose of 5 mg now, added hydralazine IV with parameters    PAD (peripheral artery disease) (HCC) -Continue aspirin 81 mg daily, intolerant of statins  Code Status: Full CODE STATUS DVT Prophylaxis:  Lovenox  Family Communication: Discussed all imaging results, lab results, explained to the patient   Disposition Plan: PT OT evaluation  Time Spent in minutes   35 minutes  Procedures:  Multiple CT imagings  Consultants:   Neurosurgery, Dr. Vertell Limber  Antimicrobials:   Anti-infectives (From admission, onward)   None  Medications  Scheduled Meds:  amLODipine  5 mg Oral Daily   brimonidine  1 drop Right Eye BID   And   timolol  1 drop Right Eye BID   dexamethasone (DECADRON) injection  4 mg Intravenous Q8H   enoxaparin (LOVENOX) injection  40 mg Subcutaneous Q24H   gabapentin  100 mg Oral TID   latanoprost  1 drop Right Eye QHS   levothyroxine  88 mcg Oral QAC breakfast   Netarsudil Dimesylate  1 drop Right Eye QHS   potassium chloride  40 mEq Oral Daily   vitamin B-12  100 mcg Oral Daily   Continuous Infusions: PRN Meds:.acetaminophen, morphine injection      Subjective:   Yvonne Tran was seen and examined today.  States headache is improving however still has  burning sensation is in her upper extremities, weakness and numbness and tingling.  Has baseline left-sided weakness, chronic from prior surgery 1980s.  Patient denies dizziness, chest pain, shortness of breath, abdominal pain, N/V/D/C.   Objective:   Vitals:   09/17/18 1930 09/17/18 1945 09/17/18 2054 09/18/18 0516  BP: (!) 172/86 (!) 172/86 (!) 177/80 (!) 181/79  Pulse:  60 67 63  Resp:  19 18 18   Temp:   98.6 F (37 C) 97.7 F (36.5 C)  TempSrc:   Oral Oral  SpO2:  100% 98% 93%  Weight:      Height:        Intake/Output Summary (Last 24 hours) at 09/18/2018 1037 Last data filed at 09/18/2018 0600 Gross per 24 hour  Intake 0 ml  Output 500 ml  Net -500 ml     Wt Readings from Last 3 Encounters:  09/17/18 90.7 kg  04/21/18 88.9 kg  07/29/17 88.9 kg     Exam  General: Alert and oriented x 3, NAD  Eyes:   HEENT:  Atraumatic, normocephalic  Cardiovascular: S1 S2 auscultated, no murmurs, RRR  Respiratory: Clear to auscultation bilaterally, no wheezing, rales or rhonchi  Gastrointestinal: Soft, nontender, nondistended, + bowel sounds  Ext: no pedal edema bilaterally  Neuro: Baseline left leg weakness with foot drop, bilateral UE 3/5  Musculoskeletal: No digital cyanosis, clubbing  Skin: No rashes  Psych: Normal affect and demeanor, alert and oriented x3    Data Reviewed:  I have personally reviewed following labs and imaging studies  Micro Results Recent Results (from the past 240 hour(s))  SARS Coronavirus 2 [facility] order, Performed in [place] hospital lab) Nasopharyngeal Nasopharyngeal Swab     Status: None   Collection Time: 09/17/18  6:12 PM   Specimen: Nasopharyngeal Swab  Result Value Ref Range Status   SARS Coronavirus 2 NEGATIVE NEGATIVE Final    Comment: (NOTE) If result is NEGATIVE SARS-CoV-2 target nucleic acids are NOT DETECTED. The SARS-CoV-2 RNA is generally detectable in upper and lower  respiratory specimens during the acute  phase of infection. The lowest  concentration of SARS-CoV-2 viral copies this assay can detect is 250  copies / mL. A negative result does not preclude SARS-CoV-2 infection  and should not be used as the sole basis for treatment or other  patient management decisions.  A negative result may occur with  improper specimen collection / handling, submission of specimen other  than nasopharyngeal swab, presence of viral mutation(s) within the  areas targeted by this assay, and inadequate number of viral copies  (<250 copies / mL). A negative result must be combined with clinical  observations, patient history, and epidemiological information. If  result is POSITIVE SARS-CoV-2 target nucleic acids are DETECTED. The SARS-CoV-2 RNA is generally detectable in upper and lower  respiratory specimens dur ing the acute phase of infection.  Positive  results are indicative of active infection with SARS-CoV-2.  Clinical  correlation with patient history and other diagnostic information is  necessary to determine patient infection status.  Positive results do  not rule out bacterial infection or co-infection with other viruses. If result is PRESUMPTIVE POSTIVE SARS-CoV-2 nucleic acids MAY BE PRESENT.   A presumptive positive result was obtained on the submitted specimen  and confirmed on repeat testing.  While 2019 novel coronavirus  (SARS-CoV-2) nucleic acids may be present in the submitted sample  additional confirmatory testing may be necessary for epidemiological  and / or clinical management purposes  to differentiate between  SARS-CoV-2 and other Sarbecovirus currently known to infect humans.  If clinically indicated additional testing with an alternate test  methodology [phone]) is advised. The SARS-CoV-2 RNA is generally  detectable in upper and lower respiratory sp ecimens during the acute  phase of infection. The expected result is Negative. Fact Sheet for Patients:   StrictlyIdeas.no Fact Sheet for Healthcare Providers: BankingDealers.co.za This test is not yet approved or cleared by the [place] FDA and has been authorized for detection and/or diagnosis of SARS-CoV-2 by FDA under an Emergency Use Authorization (EUA).  This EUA will remain in effect (meaning this test can be used) for the duration of the COVID-19 declaration under Section 564(b)(1) of the Act, 21 U.S.C. section 360bbb-3(b)(1), unless the authorization is terminated or revoked sooner. Performed at [facility], [place] [address] [place], [place] [address]     Radiology Reports Ct Head Wo Contrast  Result Date: 09/17/2018 CLINICAL DATA:  Head trauma secondary to a fall out of bed this morning. Scalp hematoma over the left frontal bone. EXAM: CT HEAD WITHOUT CONTRAST TECHNIQUE: Contiguous axial images were obtained from the base of the skull through the vertex without intravenous contrast. COMPARISON:  06/01/2009 FINDINGS: Brain: No evidence of acute infarction, hemorrhage, hydrocephalus, extra-axial collection or mass lesion/mass effect. Vascular: No hyperdense vessel or unexpected calcification. Skull: Normal. Negative for fracture or focal lesion. Sinuses/Orbits: No acute abnormality. Tiny metallic foreign body in the anterior aspect of the left eye, unchanged since 06/01/2009. No acute abnormality of the sinuses. Other: Scalp hematoma over the left frontal bone approximately 2 cm in size. This is adjacent to the lateral aspect of the left superior orbital rim. IMPRESSION: No acute intracranial abnormality. Scalp hematoma over the left frontal bone. Electronically Signed   By: Lorriane Shire M.D.   On: 09/17/2018 10:32   Ct Chest W Contrast  Result Date: 09/17/2018 CLINICAL DATA:  Back pain after a fall out of bed this morning. Head trauma with scalp hematoma. EXAM: CT CHEST WITH CONTRAST TECHNIQUE: Multidetector CT imaging  of the chest was performed during intravenous contrast administration. CONTRAST:  87mL OMNIPAQUE IOHEXOL 300 MG/ML  SOLN COMPARISON:  CT scan of the abdomen and pelvis dated 09/12/2015 FINDINGS: Cardiovascular: Aortic atherosclerosis. Heart size is normal. Small pericardial effusion, chronic. Mediastinum/Nodes: Thyroidectomy. No hilar or mediastinal adenopathy. Esophagus and trachea appear normal. Lungs/Pleura: 4 mm calcified granuloma in the left upper lobe. Chronic central diaphragmatic hernia. The dome of the right lobe of the liver protrudes through the hernia and compresses the right middle lobe chronically. Minimal scarring at the left lung base. No effusions. Upper Abdomen: Multiple benign-appearing cysts in the liver. Benign cystic lesion in the  spleen. Small stable cyst on the upper pole the left kidney. Small progressive cyst in the upper pole the right kidney. Musculoskeletal: No chest wall abnormality. No acute or significant osseous findings. IMPRESSION: 1. No acute abnormalities. 2. Chronic right central diaphragmatic hernia containing the dome of the right lobe of the liver. Aortic Atherosclerosis (ICD10-I70.0). Electronically Signed   By: Lorriane Shire M.D.   On: 09/17/2018 10:47   Ct Cervical Spine Wo Contrast  Result Date: 09/17/2018 CLINICAL DATA:  Head trauma secondary to a fall out of bed this morning. Scalp hematoma. EXAM: CT CERVICAL SPINE WITHOUT CONTRAST TECHNIQUE: Multidetector CT imaging of the cervical spine was performed without intravenous contrast. Multiplanar CT image reconstructions were also generated. COMPARISON:  None. FINDINGS: Alignment: Normal. Skull base and vertebrae: No acute fracture. No primary bone lesion or focal pathologic process. Soft tissues and spinal canal: No prevertebral fluid or swelling. No visible canal hematoma. Disc levels: C2-3: Moderate right facet arthritis. Slight right foraminal narrowing. C3-4: Small disc bulge to the left of midline. C4-5:  Moderate left facet arthritis. C5-6: Disc space narrowing with broad-based endplate osteophyte from the inferior endplate of C5 with slight narrowing of both neural foramina. C6-7: Slight disc space narrowing. C7-T1: Negative. Upper chest: Negative. Other: None. IMPRESSION: No acute abnormality of the cervical spine. Multilevel degenerative disc and joint disease. Electronically Signed   By: Lorriane Shire M.D.   On: 09/17/2018 10:39   Ct Thoracic Spine Wo Contrast  Result Date: 09/17/2018 CLINICAL DATA:  Back pain and paresthesias and bilateral upper extremity weakness since a fall out of bed this morning. Hematoma on the left side of the forehead. EXAM: CT THORACIC SPINE WITHOUT CONTRAST TECHNIQUE: Multidetector CT images of the thoracic were obtained using the standard protocol without intravenous contrast. COMPARISON:  None. FINDINGS: Alignment: Normal. Vertebrae: No acute fracture or focal pathologic process. Paraspinal and other soft tissues: Negative. Disc levels: No visible disc protrusions. No spinal stenosis. Degenerative disc disease primarily at T7-8, T9-10, and T10-11. Right facet arthritis at T11-12. IMPRESSION: No acute abnormality of the thoracic spine. Multilevel degenerative disc disease. Electronically Signed   By: Lorriane Shire M.D.   On: 09/17/2018 10:28   Mr Cervical Spine Wo Contrast  Result Date: 09/17/2018 CLINICAL DATA:  Head trauma after falling out of bed this morning. EXAM: MRI CERVICAL SPINE WITHOUT CONTRAST TECHNIQUE: Multiplanar, multisequence MR imaging of the cervical spine was performed. No intravenous contrast was administered. COMPARISON:  CT scan dated 09/17/2018 and 06/02/2011 FINDINGS: Alignment: Minimal chronic retrolisthesis of C5 and C6, unchanged since 2013. Vertebrae: No fracture or bone destruction. Cord: Disc protrusion at C3-4 slightly indents the ventral aspect of the left side of the spinal cord without myelopathy. Retrolisthesis and endplate osteophytes  compress the spinal cord to an AP dimension of 6 mm at C5-6 without myelopathy. Posterior Fossa, vertebral arteries, paraspinal tissues: There is a tiny amount of prevertebral fluid at C3, C4 and C5 seen on series 6. No other soft tissue edema in the neck. No mass lesions. Disc levels: C2-3: Normal disc.  Moderate right facet arthritis. C3-4: Small soft disc protrusion to the left of midline indenting the ventral aspect of the spinal cord without myelopathy. No foraminal stenosis. C4-5: Slight left facet arthritis.  Otherwise negative. C5-6: Chronic disc space narrowing and slight retrolisthesis. Narrowing of the AP dimension of the spinal canal with slight compression of the spinal cord without myelopathy. Bilateral foraminal stenosis. C6-7: Degenerative changes of the vertebral endplates. Tiny disc bulges  into the neural foramen without foraminal stenosis. C7-T1: Slight right facet arthritis.  Otherwise negative. IMPRESSION: 1. Cervical spinal stenosis at C5-6 with slight compression of the spinal cord but without myelopathy. 2. Soft disc protrusion at C3-4 to the left with slight compression of the spinal cord without myelopathy. 3. No fracture. There is a small amount of prevertebral fluid from C3-C5 which is nonspecific. I see no evidence of other soft tissue injury. Electronically Signed   By: Lorriane Shire M.D.   On: 09/17/2018 14:24   Dg Cerv Spine Flex&ext Only  Result Date: 09/17/2018 CLINICAL DATA:  Head trauma after fall and of the bed this morning. Small amount of prevertebral edema or fluid seen on MRI scan today. EXAM: CERVICAL SPINE - FLEXION AND EXTENSION VIEWS ONLY COMPARISON:  None. FINDINGS: There is slight reversal of the normal cervical lordosis. There is no abnormal subluxation with flexion or extension. Motion in the mid to lower cervical spine is quite limited. IMPRESSION: No abnormal subluxation with flexion or extension. Electronically Signed   By: Lorriane Shire M.D.   On: 09/17/2018  16:05   Ct Maxillofacial Wo Contrast  Result Date: 09/17/2018 CLINICAL DATA:  Facial trauma secondary to a fall out of bed this morning. Scalp hematoma over the left frontal bone. EXAM: CT MAXILLOFACIAL WITHOUT CONTRAST TECHNIQUE: Multidetector CT imaging of the maxillofacial structures was performed. Multiplanar CT image reconstructions were also generated. COMPARISON:  CT scan of the head dated 06/01/2009 FINDINGS: Osseous: There is no fracture. No destructive process. Orbits: Tiny metallic foreign body in the anterior chamber of the left eye, unchanged. Sinuses: Minimal mucosal thickening of the left maxillary sinus. Otherwise normal. Soft tissues: Scalp hematoma and scalp contusion over the left superior orbital rim. No adjacent fracture. Limited intracranial: Normal. IMPRESSION: 1. No acute abnormality of the maxillofacial structures. Scalp hematoma and contusion over the left superior orbital rim. 2. Tiny metallic foreign body in the anterior chamber of the left eye, unchanged. Electronically Signed   By: Lorriane Shire M.D.   On: 09/17/2018 10:36    Lab Data:  CBC: Recent Labs  Lab 09/17/18 0803 09/18/18 0353  WBC 4.7 5.6  NEUTROABS 2.6  --   HGB 13.0 13.5  HCT 40.2 42.3  MCV 88.2 88.5  PLT 234 [id number]   Basic Metabolic Panel: Recent Labs  Lab 09/17/18 0803 09/18/18 0353  NA 142 139  K 3.2* 4.2  CL 107 104  CO2 24 25  GLUCOSE 133* 151*  BUN 15 14  CREATININE 0.85 0.73  CALCIUM 8.7* 9.0   GFR: Estimated Creatinine Clearance: 61.4 mL/min (by C-G formula based on SCr of 0.73 mg/dL). Liver Function Tests: Recent Labs  Lab 09/18/18 0353  AST 32  ALT 22  ALKPHOS 74  BILITOT 0.4  PROT 7.1  ALBUMIN 3.6   No results for input(s): LIPASE, AMYLASE in the last 168 hours. No results for input(s): AMMONIA in the last 168 hours. Coagulation Profile: Recent Labs  Lab 09/18/18 0353  INR 1.1   Cardiac Enzymes: No results for input(s): CKTOTAL, CKMB, CKMBINDEX, TROPONINI in  the last 168 hours. BNP (last 3 results) No results for input(s): PROBNP in the last 8760 hours. HbA1C: No results for input(s): HGBA1C in the last 72 hours. CBG: No results for input(s): GLUCAP in the last 168 hours. Lipid Profile: No results for input(s): CHOL, HDL, LDLCALC, TRIG, CHOLHDL, LDLDIRECT in the last 72 hours. Thyroid Function Tests: No results for input(s): TSH, T4TOTAL, FREET4, T3FREE, THYROIDAB in  the last 72 hours. Anemia Panel: No results for input(s): VITAMINB12, FOLATE, FERRITIN, TIBC, IRON, RETICCTPCT in the last 72 hours. Urine analysis: No results found for: COLORURINE, APPEARANCEUR, LABSPEC, PHURINE, GLUCOSEU, HGBUR, BILIRUBINUR, KETONESUR, PROTEINUR, UROBILINOGEN, NITRITE, LEUKOCYTESUR   Miroslav Gin M.D. [facility] 09/18/2018, 10:37 AM  Pager: [phone] Between 7am to 7pm - call Pager - [phone]  After 7pm go to [email or web address] - password [email or web address]  Call night coverage person covering after 7pm

## 2018-09-19 LAB — CBC
HCT: 41.9 % (ref 36.0–46.0)
Hemoglobin: 13.5 g/dL (ref 12.0–15.0)
MCH: 28 pg (ref 26.0–34.0)
MCHC: 32.2 g/dL (ref 30.0–36.0)
MCV: 86.7 fL (ref 80.0–100.0)
Platelets: 265 10*3/uL (ref 150–400)
RBC: 4.83 MIL/uL (ref 3.87–5.11)
RDW: 15.6 % — ABNORMAL HIGH (ref 11.5–15.5)
WBC: 10.4 10*3/uL (ref 4.0–10.5)
nRBC: 0 % (ref 0.0–0.2)

## 2018-09-19 LAB — BASIC METABOLIC PANEL
Anion gap: 9 (ref 5–15)
BUN: 21 mg/dL (ref 8–23)
CO2: 24 mmol/L (ref 22–32)
Calcium: 9 mg/dL (ref 8.9–10.3)
Chloride: 106 mmol/L (ref 98–111)
Creatinine, Ser: 0.83 mg/dL (ref 0.44–1.00)
GFR calc Af Amer: >60 mL/min (ref >60)
GFR calc non Af Amer: >60 mL/min (ref >60)
Glucose, Bld: 145 mg/dL — ABNORMAL HIGH (ref 70–99)
Potassium: 4.6 mmol/L (ref 3.5–5.1)
Sodium: 139 mmol/L (ref 135–145)

## 2018-09-19 MED ORDER — DEXAMETHASONE 4 MG PO TABS
4.0000 mg | ORAL_TABLET | Freq: Three times a day (TID) | ORAL | Status: AC
  Administered 2018-09-19 – 2018-09-20 (×3): 4 mg via ORAL
  Filled 2018-09-19 (×3): qty 1

## 2018-09-19 MED ORDER — POLYETHYLENE GLYCOL 3350 17 G PO PACK
17.0000 g | PACK | Freq: Every day | ORAL | Status: DC | PRN
  Administered 2018-09-19 – 2018-09-20 (×2): 17 g via ORAL
  Filled 2018-09-19 (×2): qty 1

## 2018-09-19 MED ORDER — DOCUSATE SODIUM 100 MG PO CAPS
100.0000 mg | ORAL_CAPSULE | Freq: Two times a day (BID) | ORAL | Status: DC
  Administered 2018-09-19 – 2018-09-20 (×4): 100 mg via ORAL
  Filled 2018-09-19 (×5): qty 1

## 2018-09-19 MED ORDER — BISACODYL 10 MG RE SUPP
10.0000 mg | Freq: Every day | RECTAL | Status: DC | PRN
  Administered 2018-09-20: 10 mg via RECTAL
  Filled 2018-09-19: qty 1

## 2018-09-19 NOTE — Progress Notes (Signed)
    Durable Medical Equipment  (From admission, onward)         Start     Ordered   09/19/18 1309  For home use only DME Hospital bed  Once    Question Answer Comment  Length of Need 6 Months   The above medical condition requires: Patient requires the ability to reposition frequently   Head must be elevated greater than: 30 degrees   Bed type Semi-electric   Support Surface: Low Air loss Mattress      09/19/18 1308

## 2018-09-19 NOTE — Progress Notes (Signed)
Unable to flush IV. Removed it.  Dr Tana Coast said it is okay for the patient to not have an IV tonight.

## 2018-09-19 NOTE — Plan of Care (Signed)

## 2018-09-19 NOTE — Progress Notes (Signed)
Physical Therapy Treatment Patient Details Name: Yvonne Tran MRN: [id number] DOB: October 05, 1934 Today's Date: 09/19/2018    History of Present Illness 84 yo female admitted after falling at home. Dx: central cord syndrome with bil UE paresthesia and weakness. MRI (+) C5-C6 stenosis, C3-C4 disc protrusion, both with slight compression of spinal cord    PT Comments    Progressing with mobility.    Follow Up Recommendations  SNF;Home health PT;Supervision/Assistance - 31 hour(per family decision-daughter wants to take pt home per chart)     Equipment Recommendations  None recommended by PT    Recommendations for Other Services       Precautions / Restrictions Precautions Precautions: Fall Precaution Comments: L side weakness/foot drop (at baseline) Restrictions Weight Bearing Restrictions: No    Mobility  Bed Mobility Overal bed mobility: Needs Assistance Bed Mobility: Sit to Supine   Sidelying to sit: Min assist;HOB elevated   Sit to supine: Min assist   General bed mobility comments: Assist for LEs. Increased time.  Transfers Overall transfer level: Needs assistance Equipment used: Rolling walker (2 wheeled) Transfers: Sit to/from Stand Sit to Stand: Min assist Stand pivot transfers: Mod assist       General transfer comment: Assist to rise, stabilize. VCs safety, technique, hand placement.  Ambulation/Gait Ambulation/Gait assistance: Min assist Gait Distance (Feet): 60 Feet Assistive device: Rolling walker (2 wheeled) Gait Pattern/deviations: Step-through pattern;Decreased stride length     General Gait Details: Assist to stabilize pt throughut distance. Unsteady. Pt was fatigued after walk.   Stairs             Wheelchair Mobility    Modified Rankin (Stroke Patients Only)       Balance Overall balance assessment: Needs assistance         Standing balance support: Bilateral upper extremity supported Standing balance-Leahy  Scale: Poor                              Cognition Arousal/Alertness: Awake/alert Behavior During Therapy: WFL for tasks assessed/performed Overall Cognitive Status: Within Functional Limits for tasks assessed                                        Exercises      General Comments        Pertinent Vitals/Pain Pain Assessment: 0-10 Pain Score: 5  Pain Location: bil hands/UEs Pain Descriptors / Indicators: Burning;Discomfort Pain Intervention(s): Monitored during session    Home Living Family/patient expects to be discharged to:: Unsure Living Arrangements: Alone   Type of Home: Apartment Home Access: Level entry   Home Layout: One level Home Equipment: Walker - 2 wheels;Cane - single point      Prior Function Level of Independence: Independent with assistive device(s)      Comments: uses RW vs cane for ambulation   PT Goals (current goals can now be found in the care plan section) Acute Rehab PT Goals Patient Stated Goal: less pain Progress towards PT goals: Progressing toward goals    Frequency    Min 3X/week      PT Plan Current plan remains appropriate    Co-evaluation              AM-PAC PT "6 Clicks" Mobility   Outcome Measure  Help needed turning from your back to your side while in a flat  bed without using bedrails?: A Little Help needed moving from lying on your back to sitting on the side of a flat bed without using bedrails?: A Little Help needed moving to and from a bed to a chair (including a wheelchair)?: A Little Help needed standing up from a chair using your arms (e.g., wheelchair or bedside chair)?: A Little Help needed to walk in hospital room?: A Little Help needed climbing 3-5 steps with a railing? : A Lot 6 Click Score: 17    End of Session Equipment Utilized During Treatment: Gait belt Activity Tolerance: Patient tolerated treatment well Patient left: in bed;with call bell/phone within  reach;with bed alarm set   PT Visit Diagnosis: Unsteadiness on feet (R26.81);History of falling (Z91.81);Repeated falls (R29.6);Difficulty in walking, not elsewhere classified (R26.2);Other symptoms and signs involving the nervous system (R29.898);Muscle weakness (generalized) (M62.81);Pain Pain - Right/Left: (bil) Pain - part of body: Arm     Time: 1351-1417 PT Time Calculation (min) (ACUTE ONLY): 26 min  Charges:  $Gait Training: 8-22 mins $Therapeutic Activity: 8-22 mins                        Weston Anna, PT Acute Rehabilitation Services Pager: [phone] Office: [phone]

## 2018-09-19 NOTE — TOC Progression Note (Addendum)
Transition of Care [facility]) - Progression Note    Patient Details  Name: Yvonne Tran MRN: [id number] Date of Birth: 03-30-1934  [facility]) CM/SW Contact  Leeroy Cha, RN Phone Number: 09/19/2018, 2:20 PM  Clinical Narrative:    Patient is recommended to go to snf or 24 supervision at [facility] to see if she can be if patient at home.message left on mobile phone to please return my call. tcf- carrie/works from home will be with patient 24 hours. Will need hospital bed and hhc.  MD has spoken to the daughter and is aware.       Expected Discharge Plan and Services           Expected Discharge Date: 09/20/18                                     Social Determinants of Health (SDOH) Interventions    Readmission Risk Interventions No flowsheet data found.

## 2018-09-19 NOTE — Evaluation (Signed)
Occupational Therapy Evaluation Patient Details Name: Yvonne Tran MRN: [id number] DOB: 1934/10/22 Today's Date: 09/19/2018    History of Present Illness 84 yo female admitted after falling at home. Dx: central cord syndrome with bil UE paresthesia and weakness. MRI (+) C5-C6 stenosis, C3-C4 disc protrusion, both with slight compression of spinal cord   Clinical Impression   Pt admitted with after a fall at home. Pt currently with functional limitations due to the deficits listed below (see OT Problem List).  Pt will benefit from skilled OT to increase their safety and independence with ADL and functional mobility for ADL to facilitate discharge to venue listed below.   Pt lives alone but will need A with ADL activity upon DC. Pt would benefit from post acute rehab or would need 24/7/ Pt reports her daughter works at [employer] and would not be able to provide 24/7 A.       Follow Up Recommendations  Home health OT;Supervision/Assistance - 24 hour;SNF    Equipment Recommendations  3 in 1 bedside commode       Precautions / Restrictions Precautions Precautions: Fall Precaution Comments: L side weakness/foot drop (at baseline)      Mobility Bed Mobility Overal bed mobility: Needs Assistance Bed Mobility: Sidelying to Sit   Sidelying to sit: Min assist;HOB elevated       General bed mobility comments: Assist for LEs and trunk. Increased time.  Transfers Overall transfer level: Needs assistance Equipment used: Rolling walker (2 wheeled) Transfers: Sit to/from [facility] Sit to Stand: Mod assist;From elevated surface Stand pivot transfers: Mod assist       General transfer comment: Assist to rise, stabilize. VCs safety, technique, hand placement.    Balance Overall balance assessment: Needs assistance;History of Falls         Standing balance support: Bilateral upper extremity supported Standing balance-Leahy Scale: Poor                              ADL either performed or assessed with clinical judgement   ADL Overall ADL's : Needs assistance/impaired Eating/Feeding: Cueing for safety;Minimal assistance;Cueing for sequencing   Grooming: Minimal assistance;Sitting   Upper Body Bathing: Minimal assistance;Sitting   Lower Body Bathing: Maximal assistance;Sit to/from stand;Cueing for safety;Cueing for compensatory techniques   Upper Body Dressing : Set up;Sitting   Lower Body Dressing: Maximal assistance;Sit to/from stand;Cueing for compensatory techniques;Cueing for safety;Cueing for sequencing   Toilet Transfer: Moderate assistance;BSC;Stand-pivot;Cueing for safety;Cueing for sequencing   Toileting- Clothing Manipulation and Hygiene: Moderate assistance;Sit to/from stand;Cueing for compensatory techniques;Cueing for safety         General ADL Comments: pt will need signiciant A with ADL activity upon DC. Pt states daughter works.  Feel pt may benefit from post acute rehab unless pt has 24/7 A     Vision Patient Visual Report: No change from baseline              Pertinent Vitals/Pain Pain Assessment: 0-10 Pain Score: 4  Pain Location: bil hands/UEs Pain Descriptors / Indicators: Burning;Discomfort Pain Intervention(s): Limited activity within patient's tolerance;Monitored during session;Repositioned     Hand Dominance Right   Extremity/Trunk Assessment Upper Extremity Assessment Upper Extremity Assessment: LUE deficits/detail LUE Deficits / Details: hip flexion 3+/5, elbow extension 3+/5, fair grip LUE Coordination: decreased fine motor;decreased gross motor   Lower Extremity Assessment LLE Deficits / Details: L foot drop (chronic)   Cervical / Trunk Assessment Cervical / Trunk  Assessment: Normal   Communication Communication Communication: No difficulties   Cognition Arousal/Alertness: Awake/alert Behavior During Therapy: WFL for tasks assessed/performed Overall Cognitive Status:  Within Functional Limits for tasks assessed                                                Home Living Family/patient expects to be discharged to:: Unsure Living Arrangements: Alone   Type of Home: Apartment Home Access: Level entry     Home Layout: One level               Home Equipment: Walker - 2 wheels;Cane - single point          Prior Functioning/Environment Level of Independence: Independent with assistive device(s)        Comments: uses RW vs cane for ambulation        OT Problem List: Decreased strength;Decreased range of motion;Decreased activity tolerance;Impaired balance (sitting and/or standing);Decreased coordination;Decreased safety awareness;Decreased knowledge of use of DME or AE;Decreased knowledge of precautions;Impaired UE functional use      OT Treatment/Interventions: Self-care/ADL training;DME and/or AE instruction    OT Goals(Current goals can be found in the care plan section) Acute Rehab OT Goals Patient Stated Goal: less pain OT Goal Formulation: With patient Time For Goal Achievement: 09/26/18  OT Frequency: Min 2X/week   Barriers to D/C: Decreased caregiver support             AM-PAC OT "6 Clicks" Daily Activity     Outcome Measure Help from another person eating meals?: A Little Help from another person taking care of personal grooming?: A Little Help from another person toileting, which includes using toliet, bedpan, or urinal?: A Lot Help from another person bathing (including washing, rinsing, drying)?: A Lot Help from another person to put on and taking off regular upper body clothing?: A Little Help from another person to put on and taking off regular lower body clothing?: A Lot 6 Click Score: 15   End of Session Equipment Utilized During Treatment: Rolling walker;Gait belt Nurse Communication: Mobility status  Activity Tolerance: Patient tolerated treatment well Patient left: in chair;with call  bell/phone within reach;with family/visitor present  OT Visit Diagnosis: Unsteadiness on feet (R26.81);Other abnormalities of gait and mobility (R26.89);Muscle weakness (generalized) (M62.81);History of falling (Z91.81);Pain Pain - Right/Left: Left Pain - part of body: Hand                Time: [id number] OT Time Calculation (min): 18 min Charges:  OT General Charges $OT Visit: 1 Visit OT Evaluation $OT Eval Moderate Complexity: 1 Mod  Kari Baars, OT Acute Rehabilitation Services [phone] Office- [place], Edwena Felty D 09/19/2018, 1:54 PM

## 2018-09-19 NOTE — Progress Notes (Signed)
[facility]                                                                              Patient Demographics  Yvonne Tran, is a 84 y.o. female, DOB - 13-Sep-1934, [id number]  Admit date - 09/17/2018   Admitting Physician Nita Sells, MD  Outpatient Primary MD for the patient is Cari Caraway, MD  Outpatient specialists:   LOS - 2  days   Medical records reviewed and are as summarized below:    Chief Complaint  Patient presents with   Head Injury   Fall       Brief summary   Patient is 84 year old female with known history of PVD, ileal stents, CAD, multinodular goiter status post resection 08/2017, hypothyroidism presented to ED after a fall.  Patient has a history of back surgery in 67s at [facility] clinic which failed and had severe inability to move left side of her body, uses walker or cane, contributing to her falls.  Patient presented to ED after found down, apparently accidentally fell out of the bed and landed on her left side of the face, hitting her head and forehead.  Patient reported severe pain in her upper extremities bilaterally, weakness with numbness and tingling Patient was placed in c-collar by EDP. The C-spine showed disc space narrowing with broad-based end plate osteophyte from inferior endplate of C5 with slight narrowing of both neural foramina.  CT chest showed chronic right central diaphragmatic hernia containing the dome of the right lobe of liver otherwise no acute abnormalities CT head showed scalp hematoma over the left frontal bone otherwise no acute intracranial abnormality.  Assessment & Plan    Principal Problem:   Central cord syndrome at C5 level of cervical spinal cord ([place]) with underlying C-spine stenosis -Patient presented after a fall, sustained a central cord injury likely secondary to extension at C5-C6 level. Presented with pain in the bilateral upper extremities, weakness, numbness and  tingling, headache -Started on Decadron, gabapentin.  Neurosurgery was consulted, seen by Dr. Vertell Limber, recommended surgery after she has improved to decompress the cervical cord at C5-C6 level.  Dr. Vertell Limber recommended surgery in a few weeks with outpatient follow-up versus return to [facility] clinic for surgery. -Still has a headache however feels better today with her bilateral upper extremities weakness.  Still has numbing and tingling.  Continue Neurontin 300 mg TID, continue IV dexamethasone, will transition to oral outpatient upon dc with taper, added Norco as needed for moderate pain -PT OT recommended skilled nursing facility however patient feels she can stay with her daughter  Active Problems:   Hypothyroidism, postsurgical -Continue Synthroid 88 MCG daily    Essential hypertension, benign -BP somewhat better from yesterday.  Continue Norvasc, hydralazine IV as needed with parameters.     PAD (peripheral artery disease) (HCC) -Continue aspirin 81 mg daily, intolerant of statins  Code Status: Full CODE STATUS DVT Prophylaxis:  Lovenox  Family Communication: Discussed all imaging results, lab results, explained to the patient and discussed with daughter on the phone.  Disposition Plan: PT OT evaluation recommended skilled nursing facility.  However patient does not want to go  to the rehab.  She wants to go home with the daughter and possibly have home health.  Discussed with the daughter on the phone, she is okay with the plan, requested hospital bed as patient has had falls from the bed before.  Patient does not feel safe to go home today, wants to work with PT and OT.  Time Spent in minutes   35 minutes  Procedures:  Multiple CT imagings  Consultants:   Neurosurgery, Dr. Vertell Limber  Antimicrobials:   Anti-infectives (From admission, onward)   None         Medications  Scheduled Meds:  amLODipine  10 mg Oral Daily   aspirin EC  81 mg Oral q morning - 10a   brimonidine   1 drop Right Eye BID   And   timolol  1 drop Right Eye BID   dexamethasone (DECADRON) injection  4 mg Intravenous Q8H   enoxaparin (LOVENOX) injection  40 mg Subcutaneous Q24H   gabapentin  300 mg Oral TID   latanoprost  1 drop Right Eye QHS   levothyroxine  88 mcg Oral QAC breakfast   Netarsudil Dimesylate  1 drop Right Eye QHS   vitamin B-12  100 mcg Oral Daily   Continuous Infusions: PRN Meds:.acetaminophen, hydrALAZINE, HYDROcodone-acetaminophen, morphine injection      Subjective:   Yvonne Tran was seen and examined today.  Per patient headache still there, bilateral upper extremity strength is slightly better from yesterday.  Still has numbness and tingling in both arms. Patient denies dizziness, chest pain, shortness of breath, abdominal pain, N/V/D/C.  No fevers.  BP improving.  Objective:   Vitals:   09/18/18 1345 09/18/18 2022 09/19/18 0450 09/19/18 0500  BP: (!) 138/59 (!) 177/72 (!) 158/71   Pulse: 60 70 63   Resp:  18 15   Temp: 98.4 F (36.9 C) 97.9 F (36.6 C) 98.6 F (37 C)   TempSrc:  Oral Oral   SpO2: 99% 98% 99%   Weight:    91.2 kg  Height:        Intake/Output Summary (Last 24 hours) at 09/19/2018 1303 Last data filed at 09/19/2018 1219 Gross per 24 hour  Intake 660 ml  Output 1100 ml  Net -440 ml     Wt Readings from Last 3 Encounters:  09/19/18 91.2 kg  04/21/18 88.9 kg  07/29/17 88.9 kg   Physical Exam  General: Alert and oriented x 3, NAD  Eyes:   HEENT:  Atraumatic, normocephalic  Cardiovascular: S1 S2 clear, no murmurs, RRR. No pedal edema b/l  Respiratory: CTAB, no wheezing, rales or rhonchi  Gastrointestinal: Soft, nontender, nondistended, NBS  Ext: no pedal edema bilaterally  Neuro: Baseline left lower extremity weakness, bilateral upper extremity strength 3-4/5,  appears to be slightly better from yesterday.  Musculoskeletal: No cyanosis, clubbing  Skin: No rashes  Psych: Normal affect and  demeanor, alert and oriented x3      Data Reviewed:  I have personally reviewed following labs and imaging studies  Micro Results Recent Results (from the past 240 hour(s))  SARS Coronavirus 2 [facility] order, Performed in [facility] hospital lab) Nasopharyngeal Nasopharyngeal Swab     Status: None   Collection Time: 09/17/18  6:12 PM   Specimen: Nasopharyngeal Swab  Result Value Ref Range Status   SARS Coronavirus 2 NEGATIVE NEGATIVE Final    Comment: (NOTE) If result is NEGATIVE SARS-CoV-2 target nucleic acids are NOT DETECTED. The SARS-CoV-2 RNA is generally detectable in upper and  lower  respiratory specimens during the acute phase of infection. The lowest  concentration of SARS-CoV-2 viral copies this assay can detect is 250  copies / mL. A negative result does not preclude SARS-CoV-2 infection  and should not be used as the sole basis for treatment or other  patient management decisions.  A negative result may occur with  improper specimen collection / handling, submission of specimen other  than nasopharyngeal swab, presence of viral mutation(s) within the  areas targeted by this assay, and inadequate number of viral copies  (<250 copies / mL). A negative result must be combined with clinical  observations, patient history, and epidemiological information. If result is POSITIVE SARS-CoV-2 target nucleic acids are DETECTED. The SARS-CoV-2 RNA is generally detectable in upper and lower  respiratory specimens dur ing the acute phase of infection.  Positive  results are indicative of active infection with SARS-CoV-2.  Clinical  correlation with patient history and other diagnostic information is  necessary to determine patient infection status.  Positive results do  not rule out bacterial infection or co-infection with other viruses. If result is PRESUMPTIVE POSTIVE SARS-CoV-2 nucleic acids MAY BE PRESENT.   A presumptive positive result was obtained on the submitted  specimen  and confirmed on repeat testing.  While 2019 novel coronavirus  (SARS-CoV-2) nucleic acids may be present in the submitted sample  additional confirmatory testing may be necessary for epidemiological  and / or clinical management purposes  to differentiate between  SARS-CoV-2 and other Sarbecovirus currently known to infect humans.  If clinically indicated additional testing with an alternate test  methodology [phone]) is advised. The SARS-CoV-2 RNA is generally  detectable in upper and lower respiratory sp ecimens during the acute  phase of infection. The expected result is Negative. Fact Sheet for Patients:  StrictlyIdeas.no Fact Sheet for Healthcare Providers: BankingDealers.co.za This test is not yet approved or cleared by the [place] FDA and has been authorized for detection and/or diagnosis of SARS-CoV-2 by FDA under an Emergency Use Authorization (EUA).  This EUA will remain in effect (meaning this test can be used) for the duration of the COVID-19 declaration under Section 564(b)(1) of the Act, 21 U.S.C. section 360bbb-3(b)(1), unless the authorization is terminated or revoked sooner. Performed at [facility], [place] [address] [place], [place] [address]     Radiology Reports Ct Head Wo Contrast  Result Date: 09/17/2018 CLINICAL DATA:  Head trauma secondary to a fall out of bed this morning. Scalp hematoma over the left frontal bone. EXAM: CT HEAD WITHOUT CONTRAST TECHNIQUE: Contiguous axial images were obtained from the base of the skull through the vertex without intravenous contrast. COMPARISON:  06/01/2009 FINDINGS: Brain: No evidence of acute infarction, hemorrhage, hydrocephalus, extra-axial collection or mass lesion/mass effect. Vascular: No hyperdense vessel or unexpected calcification. Skull: Normal. Negative for fracture or focal lesion. Sinuses/Orbits: No acute abnormality. Tiny metallic  foreign body in the anterior aspect of the left eye, unchanged since 06/01/2009. No acute abnormality of the sinuses. Other: Scalp hematoma over the left frontal bone approximately 2 cm in size. This is adjacent to the lateral aspect of the left superior orbital rim. IMPRESSION: No acute intracranial abnormality. Scalp hematoma over the left frontal bone. Electronically Signed   By: Lorriane Shire M.D.   On: 09/17/2018 10:32   Ct Chest W Contrast  Result Date: 09/17/2018 CLINICAL DATA:  Back pain after a fall out of bed this morning. Head trauma with scalp hematoma. EXAM: CT CHEST WITH  CONTRAST TECHNIQUE: Multidetector CT imaging of the chest was performed during intravenous contrast administration. CONTRAST:  77mL OMNIPAQUE IOHEXOL 300 MG/ML  SOLN COMPARISON:  CT scan of the abdomen and pelvis dated 09/12/2015 FINDINGS: Cardiovascular: Aortic atherosclerosis. Heart size is normal. Small pericardial effusion, chronic. Mediastinum/Nodes: Thyroidectomy. No hilar or mediastinal adenopathy. Esophagus and trachea appear normal. Lungs/Pleura: 4 mm calcified granuloma in the left upper lobe. Chronic central diaphragmatic hernia. The dome of the right lobe of the liver protrudes through the hernia and compresses the right middle lobe chronically. Minimal scarring at the left lung base. No effusions. Upper Abdomen: Multiple benign-appearing cysts in the liver. Benign cystic lesion in the spleen. Small stable cyst on the upper pole the left kidney. Small progressive cyst in the upper pole the right kidney. Musculoskeletal: No chest wall abnormality. No acute or significant osseous findings. IMPRESSION: 1. No acute abnormalities. 2. Chronic right central diaphragmatic hernia containing the dome of the right lobe of the liver. Aortic Atherosclerosis (ICD10-I70.0). Electronically Signed   By: Lorriane Shire M.D.   On: 09/17/2018 10:47   Ct Cervical Spine Wo Contrast  Result Date: 09/17/2018 CLINICAL DATA:  Head trauma  secondary to a fall out of bed this morning. Scalp hematoma. EXAM: CT CERVICAL SPINE WITHOUT CONTRAST TECHNIQUE: Multidetector CT imaging of the cervical spine was performed without intravenous contrast. Multiplanar CT image reconstructions were also generated. COMPARISON:  None. FINDINGS: Alignment: Normal. Skull base and vertebrae: No acute fracture. No primary bone lesion or focal pathologic process. Soft tissues and spinal canal: No prevertebral fluid or swelling. No visible canal hematoma. Disc levels: C2-3: Moderate right facet arthritis. Slight right foraminal narrowing. C3-4: Small disc bulge to the left of midline. C4-5: Moderate left facet arthritis. C5-6: Disc space narrowing with broad-based endplate osteophyte from the inferior endplate of C5 with slight narrowing of both neural foramina. C6-7: Slight disc space narrowing. C7-T1: Negative. Upper chest: Negative. Other: None. IMPRESSION: No acute abnormality of the cervical spine. Multilevel degenerative disc and joint disease. Electronically Signed   By: Lorriane Shire M.D.   On: 09/17/2018 10:39   Ct Thoracic Spine Wo Contrast  Result Date: 09/17/2018 CLINICAL DATA:  Back pain and paresthesias and bilateral upper extremity weakness since a fall out of bed this morning. Hematoma on the left side of the forehead. EXAM: CT THORACIC SPINE WITHOUT CONTRAST TECHNIQUE: Multidetector CT images of the thoracic were obtained using the standard protocol without intravenous contrast. COMPARISON:  None. FINDINGS: Alignment: Normal. Vertebrae: No acute fracture or focal pathologic process. Paraspinal and other soft tissues: Negative. Disc levels: No visible disc protrusions. No spinal stenosis. Degenerative disc disease primarily at T7-8, T9-10, and T10-11. Right facet arthritis at T11-12. IMPRESSION: No acute abnormality of the thoracic spine. Multilevel degenerative disc disease. Electronically Signed   By: Lorriane Shire M.D.   On: 09/17/2018 10:28   Mr  Cervical Spine Wo Contrast  Result Date: 09/17/2018 CLINICAL DATA:  Head trauma after falling out of bed this morning. EXAM: MRI CERVICAL SPINE WITHOUT CONTRAST TECHNIQUE: Multiplanar, multisequence MR imaging of the cervical spine was performed. No intravenous contrast was administered. COMPARISON:  CT scan dated 09/17/2018 and 06/02/2011 FINDINGS: Alignment: Minimal chronic retrolisthesis of C5 and C6, unchanged since 2013. Vertebrae: No fracture or bone destruction. Cord: Disc protrusion at C3-4 slightly indents the ventral aspect of the left side of the spinal cord without myelopathy. Retrolisthesis and endplate osteophytes compress the spinal cord to an AP dimension of 6 mm at C5-6 without myelopathy.  Posterior Fossa, vertebral arteries, paraspinal tissues: There is a tiny amount of prevertebral fluid at C3, C4 and C5 seen on series 6. No other soft tissue edema in the neck. No mass lesions. Disc levels: C2-3: Normal disc.  Moderate right facet arthritis. C3-4: Small soft disc protrusion to the left of midline indenting the ventral aspect of the spinal cord without myelopathy. No foraminal stenosis. C4-5: Slight left facet arthritis.  Otherwise negative. C5-6: Chronic disc space narrowing and slight retrolisthesis. Narrowing of the AP dimension of the spinal canal with slight compression of the spinal cord without myelopathy. Bilateral foraminal stenosis. C6-7: Degenerative changes of the vertebral endplates. Tiny disc bulges into the neural foramen without foraminal stenosis. C7-T1: Slight right facet arthritis.  Otherwise negative. IMPRESSION: 1. Cervical spinal stenosis at C5-6 with slight compression of the spinal cord but without myelopathy. 2. Soft disc protrusion at C3-4 to the left with slight compression of the spinal cord without myelopathy. 3. No fracture. There is a small amount of prevertebral fluid from C3-C5 which is nonspecific. I see no evidence of other soft tissue injury. Electronically  Signed   By: Lorriane Shire M.D.   On: 09/17/2018 14:24   Dg Cerv Spine Flex&ext Only  Result Date: 09/17/2018 CLINICAL DATA:  Head trauma after fall and of the bed this morning. Small amount of prevertebral edema or fluid seen on MRI scan today. EXAM: CERVICAL SPINE - FLEXION AND EXTENSION VIEWS ONLY COMPARISON:  None. FINDINGS: There is slight reversal of the normal cervical lordosis. There is no abnormal subluxation with flexion or extension. Motion in the mid to lower cervical spine is quite limited. IMPRESSION: No abnormal subluxation with flexion or extension. Electronically Signed   By: Lorriane Shire M.D.   On: 09/17/2018 16:05   Ct Maxillofacial Wo Contrast  Result Date: 09/17/2018 CLINICAL DATA:  Facial trauma secondary to a fall out of bed this morning. Scalp hematoma over the left frontal bone. EXAM: CT MAXILLOFACIAL WITHOUT CONTRAST TECHNIQUE: Multidetector CT imaging of the maxillofacial structures was performed. Multiplanar CT image reconstructions were also generated. COMPARISON:  CT scan of the head dated 06/01/2009 FINDINGS: Osseous: There is no fracture. No destructive process. Orbits: Tiny metallic foreign body in the anterior chamber of the left eye, unchanged. Sinuses: Minimal mucosal thickening of the left maxillary sinus. Otherwise normal. Soft tissues: Scalp hematoma and scalp contusion over the left superior orbital rim. No adjacent fracture. Limited intracranial: Normal. IMPRESSION: 1. No acute abnormality of the maxillofacial structures. Scalp hematoma and contusion over the left superior orbital rim. 2. Tiny metallic foreign body in the anterior chamber of the left eye, unchanged. Electronically Signed   By: Lorriane Shire M.D.   On: 09/17/2018 10:36    Lab Data:  CBC: Recent Labs  Lab 09/17/18 0803 09/18/18 0353 09/19/18 0434  WBC 4.7 5.6 10.4  NEUTROABS 2.6  --   --   HGB 13.0 13.5 13.5  HCT 40.2 42.3 41.9  MCV 88.2 88.5 86.7  PLT 234 250 [id number]   Basic Metabolic  Panel: Recent Labs  Lab 09/17/18 0803 09/18/18 0353 09/19/18 0434  NA 142 139 139  K 3.2* 4.2 4.6  CL 107 104 106  CO2 24 25 24   GLUCOSE 133* 151* 145*  BUN 15 14 21   CREATININE 0.85 0.73 0.83  CALCIUM 8.7* 9.0 9.0   GFR: Estimated Creatinine Clearance: 59.3 mL/min (by C-G formula based on SCr of 0.83 mg/dL). Liver Function Tests: Recent Labs  Lab 09/18/18 [phone]  AST 32  ALT 22  ALKPHOS 74  BILITOT 0.4  PROT 7.1  ALBUMIN 3.6   No results for input(s): LIPASE, AMYLASE in the last 168 hours. No results for input(s): AMMONIA in the last 168 hours. Coagulation Profile: Recent Labs  Lab 09/18/18 0353  INR 1.1   Cardiac Enzymes: No results for input(s): CKTOTAL, CKMB, CKMBINDEX, TROPONINI in the last 168 hours. BNP (last 3 results) No results for input(s): PROBNP in the last 8760 hours. HbA1C: No results for input(s): HGBA1C in the last 72 hours. CBG: No results for input(s): GLUCAP in the last 168 hours. Lipid Profile: No results for input(s): CHOL, HDL, LDLCALC, TRIG, CHOLHDL, LDLDIRECT in the last 72 hours. Thyroid Function Tests: No results for input(s): TSH, T4TOTAL, FREET4, T3FREE, THYROIDAB in the last 72 hours. Anemia Panel: No results for input(s): VITAMINB12, FOLATE, FERRITIN, TIBC, IRON, RETICCTPCT in the last 72 hours. Urine analysis: No results found for: COLORURINE, APPEARANCEUR, LABSPEC, PHURINE, GLUCOSEU, HGBUR, BILIRUBINUR, KETONESUR, PROTEINUR, UROBILINOGEN, NITRITE, LEUKOCYTESUR   Marsela Kuan M.D. [facility] 09/19/2018, 1:03 PM  Pager: [phone] Between 7am to 7pm - call Pager - [phone]  After 7pm go to [email or web address] - password [email or web address]  Call night coverage person covering after 7pm

## 2018-09-20 ENCOUNTER — Inpatient Hospital Stay (HOSPITAL_COMMUNITY): Payer: PPO

## 2018-09-20 LAB — BASIC METABOLIC PANEL
Anion gap: 12 (ref 5–15)
BUN: 24 mg/dL — ABNORMAL HIGH (ref 8–23)
CO2: 22 mmol/L (ref 22–32)
Calcium: 8.6 mg/dL — ABNORMAL LOW (ref 8.9–10.3)
Chloride: 105 mmol/L (ref 98–111)
Creatinine, Ser: 0.77 mg/dL (ref 0.44–1.00)
GFR calc Af Amer: >60 mL/min (ref >60)
GFR calc non Af Amer: >60 mL/min (ref >60)
Glucose, Bld: 130 mg/dL — ABNORMAL HIGH (ref 70–99)
Potassium: 4.1 mmol/L (ref 3.5–5.1)
Sodium: 139 mmol/L (ref 135–145)

## 2018-09-20 LAB — CBC
HCT: 41.1 % (ref 36.0–46.0)
Hemoglobin: 13.4 g/dL (ref 12.0–15.0)
MCH: 28.2 pg (ref 26.0–34.0)
MCHC: 32.6 g/dL (ref 30.0–36.0)
MCV: 86.3 fL (ref 80.0–100.0)
Platelets: 270 10*3/uL (ref 150–400)
RBC: 4.76 MIL/uL (ref 3.87–5.11)
RDW: 15.6 % — ABNORMAL HIGH (ref 11.5–15.5)
WBC: 9.7 10*3/uL (ref 4.0–10.5)
nRBC: 0 % (ref 0.0–0.2)

## 2018-09-20 MED ORDER — LOSARTAN POTASSIUM 50 MG PO TABS
50.0000 mg | ORAL_TABLET | Freq: Every day | ORAL | Status: DC
  Administered 2018-09-20 – 2018-09-21 (×2): 50 mg via ORAL
  Filled 2018-09-20 (×2): qty 1

## 2018-09-20 MED ORDER — POLYETHYLENE GLYCOL 3350 17 G PO PACK: 17.0000 g | PACK | Freq: Every day | ORAL | 0 refills | Status: DC | PRN

## 2018-09-20 MED ORDER — HYDROCODONE-ACETAMINOPHEN 5-325 MG PO TABS: 1.0000 | ORAL_TABLET | Freq: Four times a day (QID) | ORAL | 0 refills | Status: AC | PRN

## 2018-09-20 MED ORDER — PANTOPRAZOLE SODIUM 40 MG PO TBEC
40.0000 mg | DELAYED_RELEASE_TABLET | Freq: Every day | ORAL | Status: DC
  Administered 2018-09-20 – 2018-09-21 (×2): 40 mg via ORAL
  Filled 2018-09-20 (×2): qty 1

## 2018-09-20 MED ORDER — AMLODIPINE BESYLATE 10 MG PO TABS: 10.0000 mg | ORAL_TABLET | Freq: Every day | ORAL | 3 refills | Status: AC

## 2018-09-20 MED ORDER — PANTOPRAZOLE SODIUM 40 MG PO TBEC: 40.0000 mg | DELAYED_RELEASE_TABLET | Freq: Every day | ORAL | 0 refills | Status: DC

## 2018-09-20 MED ORDER — ONDANSETRON 4 MG PO TBDP: 4.0000 mg | ORAL_TABLET | Freq: Three times a day (TID) | ORAL | 0 refills | Status: DC | PRN

## 2018-09-20 MED ORDER — CLONIDINE HCL 0.1 MG PO TABS
0.1000 mg | ORAL_TABLET | Freq: Once | ORAL | Status: AC
  Administered 2018-09-20: 0.1 mg via ORAL
  Filled 2018-09-20: qty 1

## 2018-09-20 MED ORDER — ONDANSETRON HCL 4 MG/2ML IJ SOLN
4.0000 mg | Freq: Four times a day (QID) | INTRAMUSCULAR | Status: DC | PRN
  Administered 2018-09-20: 4 mg via INTRAVENOUS
  Filled 2018-09-20: qty 2

## 2018-09-20 MED ORDER — DEXAMETHASONE 2 MG PO TABS: 2.0000 mg | ORAL_TABLET | Freq: Two times a day (BID) | ORAL | 0 refills | Status: AC

## 2018-09-20 MED ORDER — ONDANSETRON HCL 4 MG PO TABS
4.0000 mg | ORAL_TABLET | Freq: Once | ORAL | Status: AC
  Administered 2018-09-20: 4 mg via ORAL
  Filled 2018-09-20: qty 1

## 2018-09-20 MED ORDER — GABAPENTIN 300 MG PO CAPS: 300.0000 mg | ORAL_CAPSULE | Freq: Three times a day (TID) | ORAL | 2 refills | Status: DC

## 2018-09-20 MED ORDER — DOCUSATE SODIUM 100 MG PO CAPS: 100.0000 mg | ORAL_CAPSULE | Freq: Two times a day (BID) | ORAL | 3 refills | Status: AC

## 2018-09-20 NOTE — Progress Notes (Signed)
32 hospitals within 25 miles from the center of [address].        Nursing Home Search Results  Results List Table  Nursing Home Information  Overall Rating  Health Inpections  Staffing  Quality Ratings  Distance   [facility]  [place]  [place], [place] [address] [phone]    3 out of 5 stars Average 3 out of 5 stars Average 2 out of 5 stars Below Average 3 out of 5 stars Average 2.55 [place]  [place]  [place], [place] [address] [phone]    2 out of 5 stars Below Average 2 out of 5 stars Below Average 2 out of 5 stars Below Average 3 out of 5 stars Average 3.17 [facility]  [address]  [place], [place] [address] [phone]    1 out of 5 stars Much Below Average 1 out of 5 stars Much Below Average 2 out of 5 stars Below Average 1 out of 5 stars Much Below Average 3.34 [place]  [place], [name] [address] [phone]    5 out of 5 stars Much Above Average 4 out of 5 stars Above Average 5 out of 5 stars Much Above Average 5 out of 5 stars Much Above Average 3.96 [place]  [place]  [place], [place] [address] [phone]    1 out of 5 stars Much Below Average 1 out of 5 stars Much Below Average 2 out of 5 stars Below Average 2 out of 5 stars Below Average 4.84 [facility] WEST  [place]  [place], [place] [address] [phone]    5 out of 5 stars Much Above Average 5 out of 5 stars Much Above Average 5 out of 5 stars Much Above Average 5 out of 5 stars Much Above Average 6.31 [place]  [address] [place], [place] [address] [phone]    2 out of 5 stars Below Average 2 out of 5 stars Below Average 2 out of 5 stars Below Average 2 out of 5 stars  Below Average 6.42 [place]  [place], [place] [address] [phone]    5 out of 5 stars Much Above Average 4 out of 5 stars Above Average 5 out of 5 stars Much Above Average 5 out of 5 stars Much Above Average 6.81 [place]  [place], [place] [address] [phone]    2 out of 5 stars Below Average 2 out of 5 stars Below Average 2 out of 5 stars Below Average 4 out of 5 stars Above Average 7.05 [facility]  [place], [place] [address] [phone]    5 out of 5 stars Much Above Average 5 out of 5 stars Much Above Average 2 out of 5 stars Below Average 5 out of 5 stars Much Above Average 7.11 [facility]  [address]  [place],  [address] [phone]    2 out of 5 stars Below Average 2 out of 5 stars Below Average 2 out of 5 stars Below Average 3 out of 5 stars  Average 7.31 [facility] THE [place] [facility]  [place], [place] [address] [phone]    2 out of 5 stars Below Average 2 out of 5 stars Below Average 2 out of 5 stars Below Average 2 out of 5 stars Below Average 7.33 [place]  [address]  [place], [place] [address] [phone]    2 out of 5 stars Below Average 2 out of 5 stars Below Average 2 out of 5 stars Below Average 3 out of 5 stars Average 8.02 [place]  [place], [place] [address] [phone]    4 out of 5 stars Above Average 4 out of 5 stars Above Average 2 out of 5 stars Below Average 4 out of 5 stars Above Average 8.87 [facility]  [address]  [place], [place] [address] [phone]    1 out of 5 stars Much Below Average 1 out of 5 stars Much Below Average 2 out of 5  stars Below Average 2 out of 5 stars Below Average 9.22 [place]  [place], [place] [address] [phone]    5 out of 5 stars Much Above Average 5 out of 5 stars Much Above Average 3 out of 5 stars Average 2 out of 5 stars Below Average 9.67 [place]  [address]  [place], [place] [address] [phone]    1 out of 5 stars Much Below Average 1 out of 5 stars Much Below Average 2 out of 5 stars Below Average 3 out of 5 stars Average 11.02 [facility] LANDING AT [facility] RIDGE  [address] [place], [place] [id number] [phone]    5 out of 5 stars Much Above Average 5 out of 5 stars Much Above Average 4 out of 5 stars Above Average 5 out of 5 stars Much Above Average 11.27 [facility]  [place], [place] [address] [phone]    1 out of 5 stars Much Below Average 1 out of 5 stars Much Below Average 2 out of 5 stars Below Average 2 out of 5 stars Below Average 11.48 [place]  [place], [place] [address] [phone]    2 out of 5 stars Below Average 1 out of 5 stars Much Below Average 4 out of 5 stars Above Average 3 out of 5 stars Average 12.88 [place]  [address]  [place], [place] [id number] [phone]    2 out of 5 stars Below Average 2 out of 5 stars Below Average 2 out of 5 stars Below Average 2 out of 5 stars Below Average 13.00 [facility]  [address] [place], [place] [address] [phone]    3 out of 5 stars Average 3 out of 5 stars Average 2 out of 5 stars Below Average 3 out of 5 stars Average 13.42 [place]  [address]  [place], [place] [address] [phone]    1 out of 5 stars Much Below Average 1 out of 5 stars Much Below Average 1 out of 5 stars  Much Below Average 3  out of 5 stars Average 14.44 [place]  [address]  [place], [place] [address] [phone]    1 out of 5 stars Much Below Average 1 out of 5 stars Much Below Average 2 out of 5 stars Below Average 3 out of 5 stars Average 15.03 [place]  [address] [place], [place] [address] [phone]    1 out of 5 stars Much Below Average 1 out of 5 stars Much Below Average 3 out of 5 stars Average 4 out of 5 stars Above Average [id number] [facility]  [address]  [place], [place] [address] [phone]    1 out of 5 stars Much Below Average 1 out of 5 stars Much Below Average Not Available12 2 out of 5 stars Below Average 17.73 [place]  [address]  [place], [place] [address] [phone]    1 out of 5 stars Much Below Average 1 out of 5 stars Much Below Average 2 out of 5 stars Below Average 2 out of 5 stars Below Average 19.69 [place]  [address]  [place], [place] [address] [phone]    4 out of 5 stars Above Average 3 out of 5 stars Average 2 out of 5 stars Below Average 5 out of 5 stars Much Above Average 19.99 [place]  [facility]  [address]  [place], [place] [address] [phone]    5 out of 5 stars Much Above Average 4 out of 5 stars Above Average 4 out of 5 stars Above Average 5 out of 5 stars Much Above Average 22.65 [place]  [place], [place] [address] [phone]    5 out of 5 stars Much Above Average 4 out of 5 stars Above Average 5 out of 5 stars Much Above Average 5 out of 5 stars Much Above Average 24.33 [facility]  [place], [place] [id number] [phone]    1 out of 5 stars Much Below Average 1 out of 5 stars Much Below Average 2 out of 5 stars Below  Average 3 out of 5 stars Average 24.51 [place]  [place]  [place], [place] [address] [phone]    5 out of 5 stars Much Above Average 4 out of 5 stars Above Average 5 out of 5 stars Much Above Average 5 out of 5 stars Much Above Average 24.66 Rachel number Footnote as displayed on nursing home Compare  1 Newly certified nursing home with less than 12-15 months of data available or the nursing opened less than 6 months ago, and there were no data to submit or claims for this measure.  2 Not enough data available to calculate a star rating.  6 This facility did not submit staffing data, or submitted data that did not meet the criteria required to calculate a staffing measure.  7 CMS determined that the percentage was not accurate or data suppressed by CMS for one or more quarters.  9 The number of residents or resident stays is too small to report. Call the facility to discuss this quality measure.  10 The data for this measure is missing or was not submitted. Call the facility to discuss this quality measure.  12 This  facility either did not submit staffing data, has reported a high number of days without a registered nurse onsite, or submitted data that could not be verified through an audit.  13 Results are based on a shorter time period than required.  14 This nursing home is not required to submit data for the [place] Reporting Program.  18 This facility is not rated due to a history of serious quality issues and is included in the special focus facility program.

## 2018-09-20 NOTE — TOC Initial Note (Signed)
Transition of Care [facility]) - Initial/Assessment Note    Patient Details  Name: Yvonne Tran MRN: [id number] Date of Birth: 10-13-1934  Transition of Care [facility]) CM/SW Contact:    Joaquin Courts, RN Phone Number: 09/20/2018, 1:02 PM  Clinical Narrative:                 CM spoke with patient at bedside and daughter via telephone. [employer] to deliver hospital bed and 3-in-1 to the home this evening. [facility] to provide HHPT and OT.  Expected Discharge Plan: [place] Barriers to Discharge: Continued Medical Work up   Patient Goals and CMS Choice Patient states their goals for this hospitalization and ongoing recovery are:: to go home CMS Medicare.gov Compare Post Acute Care list provided to:: Patient Choice offered to / list presented to : Patient  Expected Discharge Plan and Services Expected Discharge Plan: [place]   Discharge Planning Services: CM Consult Post Acute Care Choice: [place] arrangements for the past 2 months: Apartment Expected Discharge Date: 09/20/18               DME Arranged: Hospital bed, 3-N-1 DME Agency: [facility] Date DME Agency Contacted: 09/20/18 Time DME Agency Contacted: 40 Representative spoke with at DME Agency: Thedore Mins HH Arranged: OT, PT [place] Agency: [place] Date [place]: 09/20/18 Time [place]: [place] Representative spoke with at [place]: Tommi Rumps  Prior Living Arrangements/Services Living arrangements for the past 2 months: Apartment Lives with:: Adult Children Patient language and need for interpreter reviewed:: Yes Do you feel safe going back to the place where you live?: Yes      Need for Family Participation in Patient Care: Yes (Comment) Care giver support system in place?: Yes (comment)   Criminal Activity/Legal Involvement Pertinent to Current Situation/Hospitalization: No - Comment as needed  Activities of Daily Living Home Assistive  Devices/Equipment: Environmental consultant (specify type), Bedside commode/3-in-1 ADL Screening (condition at time of admission) Patient's cognitive ability adequate to safely complete daily activities?: Yes Is the patient deaf or have difficulty hearing?: No Does the patient have difficulty seeing, even when wearing glasses/contacts?: Yes Does the patient have difficulty concentrating, remembering, or making decisions?: No Patient able to express need for assistance with ADLs?: Yes Does the patient have difficulty dressing or bathing?: No Independently performs ADLs?: Yes (appropriate for developmental age) Does the patient have difficulty walking or climbing stairs?: Yes Weakness of Legs: Both Weakness of Arms/Hands: Both  Permission Sought/Granted                  Emotional Assessment Appearance:: Appears stated age Attitude/Demeanor/Rapport: Engaged Affect (typically observed): Accepting Orientation: : Oriented to Self, Oriented to Place, Oriented to  Time, Oriented to Situation   Psych Involvement: No (comment)  Admission diagnosis:  Pain [R52] Central cord syndrome at C5 level of cervical spinal cord, initial encounter [facility]) [id number] Patient Active Problem List   Diagnosis Date Noted  . Central cord syndrome at C5 level of cervical spinal cord ([place]) 09/17/2018  . PAD (peripheral artery disease) ([place]) 02/14/2014  . Mixed hyperlipidemia 02/10/2013  . Pain in limb 02/10/2013  . Essential hypertension, benign 02/10/2013  . Cough 10/07/2012  . Hypothyroidism, postsurgical 09/08/2011  . Multinodular goiter (nontoxic) 07/08/2011  . Abdominal wall mass of left upper quadrant, 4 x 5 cm in anterior axillary line. 11/10/2010   PCP:  Cari Caraway, MD Pharmacy:   [facility] ([place]) [place],  [place] - [id number] [place] [address] [place] [address] Phone: [phone] Fax: [phone]  [place], [place] [address] Phone: [phone] Fax: [phone]  [place], [place] - [address] [place] [address] Phone: [phone] Fax: [phone]  [facility] [phone] - [place], [address] AT [place] [address] [place] [address] Phone: [phone] Fax: [phone]     Social Determinants of Health ([place]) Interventions    Readmission Risk Interventions No flowsheet data found.

## 2018-09-20 NOTE — Progress Notes (Signed)
[facility]                                                                              Patient Demographics  Yvonne Tran, is a 84 y.o. female, DOB - 27-Mar-1934, [id number]  Admit date - 09/17/2018   Admitting Physician Nita Sells, MD  Outpatient Primary MD for the patient is Cari Caraway, MD  Outpatient specialists:   LOS - 3  days   Medical records reviewed and are as summarized below:    Chief Complaint  Patient presents with   Head Injury   Fall       Brief summary   Patient is 84 year old female with known history of PVD, ileal stents, CAD, multinodular goiter status post resection 08/2017, hypothyroidism presented to ED after a fall.  Patient has a history of back surgery in 40s at [facility] clinic which failed and had severe inability to move left side of her body, uses walker or cane, contributing to her falls.  Patient presented to ED after found down, apparently accidentally fell out of the bed and landed on her left side of the face, hitting her head and forehead.  Patient reported severe pain in her upper extremities bilaterally, weakness with numbness and tingling Patient was placed in c-collar by EDP. The C-spine showed disc space narrowing with broad-based end plate osteophyte from inferior endplate of C5 with slight narrowing of both neural foramina.  CT chest showed chronic right central diaphragmatic hernia containing the dome of the right lobe of liver otherwise no acute abnormalities CT head showed scalp hematoma over the left frontal bone otherwise no acute intracranial abnormality.  Assessment & Plan    Principal Problem:   Central cord syndrome at C5 level of cervical spinal cord ([place]) with underlying C-spine stenosis -Patient presented after a fall, sustained a central cord injury likely secondary to extension at C5-C6 level. Presented with pain in the bilateral upper extremities, weakness, numbness and  tingling, headache -Started on Decadron, gabapentin.  Neurosurgery was consulted, seen by Dr. Vertell Limber, recommended surgery after she has improved to decompress the cervical cord at C5-C6 level.  Dr. Vertell Limber recommended surgery in a few weeks with outpatient follow-up versus return to [facility] clinic for surgery. -Bilateral upper extremity weakness, numbness and tingling improving.   -Continue Neurontin 300 mg 3 times daily.  Patient was transitioned to oral dexamethasone 4 mg every 8 hours, she has completed total of 3 days.  Received 4 mg p.o. dexamethasone this morning, may transition to 2 mg p.o. twice daily upon discharge if still symptomatic, until she follows up with Dr. Olin Hauser next week. -PT recommended skilled nursing facility however patient prefers to go home with daughter with home health PT  Active Problems: Nausea and vomiting -Plan was to discharge home today however patient started having vomiting, 2 episodes around lunchtime. -Abdominal x-ray showed no ileus or pSBO -Continue Colace, stool softeners, possibly having esophagitis from Decadron.  Placed on PPI, Zofran    Hypothyroidism, postsurgical -Continue Synthroid 88 MCG daily    Essential hypertension, benign -BP still somewhat elevated, Norvasc dose was increased to 10 mg daily,  - continue hydralazine  IV as needed with parameters.  Added losartan 50 mg daily    PAD (peripheral artery disease) (HCC) -Continue aspirin 81 mg daily, intolerant of statins  Code Status: Full CODE STATUS DVT Prophylaxis:  Lovenox  Family Communication: Discussed all imaging results, lab results, explained to the patient and discussed with daughter on the phone on 9/14.  Disposition Plan: PT OT evaluation recommended skilled nursing facility.  However patient does not want to go to the rehab.  She wants to go home with the daughter and possibly have home health.  Discussed with the daughter on the phone, she is okay with the plan, requested  hospital bed as patient has had falls from the bed before.   DME 3n1 ordered  Time Spent in minutes   35 minutes  Procedures:  Multiple CT imagings  Consultants:   Neurosurgery, Dr. Vertell Limber  Antimicrobials:   Anti-infectives (From admission, onward)   None         Medications  Scheduled Meds:  amLODipine  10 mg Oral Daily   aspirin EC  81 mg Oral q morning - 10a   brimonidine  1 drop Right Eye BID   And   timolol  1 drop Right Eye BID   docusate sodium  100 mg Oral BID   enoxaparin (LOVENOX) injection  40 mg Subcutaneous Q24H   gabapentin  300 mg Oral TID   latanoprost  1 drop Right Eye QHS   levothyroxine  88 mcg Oral QAC breakfast   Netarsudil Dimesylate  1 drop Right Eye QHS   pantoprazole  40 mg Oral Daily   vitamin B-12  100 mcg Oral Daily   Continuous Infusions: PRN Meds:.acetaminophen, bisacodyl, hydrALAZINE, HYDROcodone-acetaminophen, morphine injection, ondansetron (ZOFRAN) IV, polyethylene glycol      Subjective:   Yvonne Tran was seen and examined today.  This morning feeling better with bilateral upper extremity weakness and numbness and tingling.  Headache improving. Around lunchtime started having nausea and vomiting x2, no hematemesis.  BP still somewhat elevated  Objective:   Vitals:   09/20/18 0422 09/20/18 0500 09/20/18 0819 09/20/18 1327  BP: (!) 180/85  (!) 141/69 (!) 144/62  Pulse: (!) 56  (!) 51 (!) 47  Resp: 14   16  Temp: 98.1 F (36.7 C)   97.6 F (36.4 C)  TempSrc: Oral   Oral  SpO2: 97%   100%  Weight:  91.8 kg    Height:        Intake/Output Summary (Last 24 hours) at 09/20/2018 1343 Last data filed at 09/20/2018 1215 Gross per 24 hour  Intake 900 ml  Output 1227 ml  Net -327 ml     Wt Readings from Last 3 Encounters:  09/20/18 91.8 kg  04/21/18 88.9 kg  07/29/17 88.9 kg   Physical Exam General: Alert and oriented x 3, NAD Eyes:  HEENT:  Atraumatic, normocephalic Cardiovascular: S1 S2  clear, RRR. No pedal edema b/l Respiratory: CTAB, no wheezing, rales or rhonchi Gastrointestinal: Soft, nontender, nondistended, NBS Ext: no pedal edema bilaterally Neuro:, Bilateral upper extremity weakness improving, 4/5, baseline L LE weakness Musculoskeletal: No cyanosis, clubbing Skin: No rashes  Psych: Normal affect and demeanor, alert and oriented x3   Data Reviewed:  I have personally reviewed following labs and imaging studies  Micro Results Recent Results (from the past 240 hour(s))  SARS Coronavirus 2 [facility] order, Performed in [facility] hospital lab) Nasopharyngeal Nasopharyngeal Swab     Status: None   Collection Time: 09/17/18  6:12 PM   Specimen: Nasopharyngeal Swab  Result Value Ref Range Status   SARS Coronavirus 2 NEGATIVE NEGATIVE Final    Comment: (NOTE) If result is NEGATIVE SARS-CoV-2 target nucleic acids are NOT DETECTED. The SARS-CoV-2 RNA is generally detectable in upper and lower  respiratory specimens during the acute phase of infection. The lowest  concentration of SARS-CoV-2 viral copies this assay can detect is 250  copies / mL. A negative result does not preclude SARS-CoV-2 infection  and should not be used as the sole basis for treatment or other  patient management decisions.  A negative result may occur with  improper specimen collection / handling, submission of specimen other  than nasopharyngeal swab, presence of viral mutation(s) within the  areas targeted by this assay, and inadequate number of viral copies  (<250 copies / mL). A negative result must be combined with clinical  observations, patient history, and epidemiological information. If result is POSITIVE SARS-CoV-2 target nucleic acids are DETECTED. The SARS-CoV-2 RNA is generally detectable in upper and lower  respiratory specimens dur ing the acute phase of infection.  Positive  results are indicative of active infection with SARS-CoV-2.  Clinical  correlation with patient  history and other diagnostic information is  necessary to determine patient infection status.  Positive results do  not rule out bacterial infection or co-infection with other viruses. If result is PRESUMPTIVE POSTIVE SARS-CoV-2 nucleic acids MAY BE PRESENT.   A presumptive positive result was obtained on the submitted specimen  and confirmed on repeat testing.  While 2019 novel coronavirus  (SARS-CoV-2) nucleic acids may be present in the submitted sample  additional confirmatory testing may be necessary for epidemiological  and / or clinical management purposes  to differentiate between  SARS-CoV-2 and other Sarbecovirus currently known to infect humans.  If clinically indicated additional testing with an alternate test  methodology [phone]) is advised. The SARS-CoV-2 RNA is generally  detectable in upper and lower respiratory sp ecimens during the acute  phase of infection. The expected result is Negative. Fact Sheet for Patients:  StrictlyIdeas.no Fact Sheet for Healthcare Providers: BankingDealers.co.za This test is not yet approved or cleared by the [place] FDA and has been authorized for detection and/or diagnosis of SARS-CoV-2 by FDA under an Emergency Use Authorization (EUA).  This EUA will remain in effect (meaning this test can be used) for the duration of the COVID-19 declaration under Section 564(b)(1) of the Act, 21 U.S.C. section 360bbb-3(b)(1), unless the authorization is terminated or revoked sooner. Performed at [facility], [place] [address], [place], [place] [address]     Radiology Reports Ct Head Wo Contrast  Result Date: 09/17/2018 CLINICAL DATA:  Head trauma secondary to a fall out of bed this morning. Scalp hematoma over the left frontal bone. EXAM: CT HEAD WITHOUT CONTRAST TECHNIQUE: Contiguous axial images were obtained from the base of the skull through the vertex without intravenous  contrast. COMPARISON:  06/01/2009 FINDINGS: Brain: No evidence of acute infarction, hemorrhage, hydrocephalus, extra-axial collection or mass lesion/mass effect. Vascular: No hyperdense vessel or unexpected calcification. Skull: Normal. Negative for fracture or focal lesion. Sinuses/Orbits: No acute abnormality. Tiny metallic foreign body in the anterior aspect of the left eye, unchanged since 06/01/2009. No acute abnormality of the sinuses. Other: Scalp hematoma over the left frontal bone approximately 2 cm in size. This is adjacent to the lateral aspect of the left superior orbital rim. IMPRESSION: No acute intracranial abnormality. Scalp hematoma over the left frontal bone.  Electronically Signed   By: Lorriane Shire M.D.   On: 09/17/2018 10:32   Ct Chest W Contrast  Result Date: 09/17/2018 CLINICAL DATA:  Back pain after a fall out of bed this morning. Head trauma with scalp hematoma. EXAM: CT CHEST WITH CONTRAST TECHNIQUE: Multidetector CT imaging of the chest was performed during intravenous contrast administration. CONTRAST:  101mL OMNIPAQUE IOHEXOL 300 MG/ML  SOLN COMPARISON:  CT scan of the abdomen and pelvis dated 09/12/2015 FINDINGS: Cardiovascular: Aortic atherosclerosis. Heart size is normal. Small pericardial effusion, chronic. Mediastinum/Nodes: Thyroidectomy. No hilar or mediastinal adenopathy. Esophagus and trachea appear normal. Lungs/Pleura: 4 mm calcified granuloma in the left upper lobe. Chronic central diaphragmatic hernia. The dome of the right lobe of the liver protrudes through the hernia and compresses the right middle lobe chronically. Minimal scarring at the left lung base. No effusions. Upper Abdomen: Multiple benign-appearing cysts in the liver. Benign cystic lesion in the spleen. Small stable cyst on the upper pole the left kidney. Small progressive cyst in the upper pole the right kidney. Musculoskeletal: No chest wall abnormality. No acute or significant osseous findings.  IMPRESSION: 1. No acute abnormalities. 2. Chronic right central diaphragmatic hernia containing the dome of the right lobe of the liver. Aortic Atherosclerosis (ICD10-I70.0). Electronically Signed   By: Lorriane Shire M.D.   On: 09/17/2018 10:47   Ct Cervical Spine Wo Contrast  Result Date: 09/17/2018 CLINICAL DATA:  Head trauma secondary to a fall out of bed this morning. Scalp hematoma. EXAM: CT CERVICAL SPINE WITHOUT CONTRAST TECHNIQUE: Multidetector CT imaging of the cervical spine was performed without intravenous contrast. Multiplanar CT image reconstructions were also generated. COMPARISON:  None. FINDINGS: Alignment: Normal. Skull base and vertebrae: No acute fracture. No primary bone lesion or focal pathologic process. Soft tissues and spinal canal: No prevertebral fluid or swelling. No visible canal hematoma. Disc levels: C2-3: Moderate right facet arthritis. Slight right foraminal narrowing. C3-4: Small disc bulge to the left of midline. C4-5: Moderate left facet arthritis. C5-6: Disc space narrowing with broad-based endplate osteophyte from the inferior endplate of C5 with slight narrowing of both neural foramina. C6-7: Slight disc space narrowing. C7-T1: Negative. Upper chest: Negative. Other: None. IMPRESSION: No acute abnormality of the cervical spine. Multilevel degenerative disc and joint disease. Electronically Signed   By: Lorriane Shire M.D.   On: 09/17/2018 10:39   Ct Thoracic Spine Wo Contrast  Result Date: 09/17/2018 CLINICAL DATA:  Back pain and paresthesias and bilateral upper extremity weakness since a fall out of bed this morning. Hematoma on the left side of the forehead. EXAM: CT THORACIC SPINE WITHOUT CONTRAST TECHNIQUE: Multidetector CT images of the thoracic were obtained using the standard protocol without intravenous contrast. COMPARISON:  None. FINDINGS: Alignment: Normal. Vertebrae: No acute fracture or focal pathologic process. Paraspinal and other soft tissues: Negative.  Disc levels: No visible disc protrusions. No spinal stenosis. Degenerative disc disease primarily at T7-8, T9-10, and T10-11. Right facet arthritis at T11-12. IMPRESSION: No acute abnormality of the thoracic spine. Multilevel degenerative disc disease. Electronically Signed   By: Lorriane Shire M.D.   On: 09/17/2018 10:28   Mr Cervical Spine Wo Contrast  Result Date: 09/17/2018 CLINICAL DATA:  Head trauma after falling out of bed this morning. EXAM: MRI CERVICAL SPINE WITHOUT CONTRAST TECHNIQUE: Multiplanar, multisequence MR imaging of the cervical spine was performed. No intravenous contrast was administered. COMPARISON:  CT scan dated 09/17/2018 and 06/02/2011 FINDINGS: Alignment: Minimal chronic retrolisthesis of C5 and C6, unchanged since  2013. Vertebrae: No fracture or bone destruction. Cord: Disc protrusion at C3-4 slightly indents the ventral aspect of the left side of the spinal cord without myelopathy. Retrolisthesis and endplate osteophytes compress the spinal cord to an AP dimension of 6 mm at C5-6 without myelopathy. Posterior Fossa, vertebral arteries, paraspinal tissues: There is a tiny amount of prevertebral fluid at C3, C4 and C5 seen on series 6. No other soft tissue edema in the neck. No mass lesions. Disc levels: C2-3: Normal disc.  Moderate right facet arthritis. C3-4: Small soft disc protrusion to the left of midline indenting the ventral aspect of the spinal cord without myelopathy. No foraminal stenosis. C4-5: Slight left facet arthritis.  Otherwise negative. C5-6: Chronic disc space narrowing and slight retrolisthesis. Narrowing of the AP dimension of the spinal canal with slight compression of the spinal cord without myelopathy. Bilateral foraminal stenosis. C6-7: Degenerative changes of the vertebral endplates. Tiny disc bulges into the neural foramen without foraminal stenosis. C7-T1: Slight right facet arthritis.  Otherwise negative. IMPRESSION: 1. Cervical spinal stenosis at C5-6  with slight compression of the spinal cord but without myelopathy. 2. Soft disc protrusion at C3-4 to the left with slight compression of the spinal cord without myelopathy. 3. No fracture. There is a small amount of prevertebral fluid from C3-C5 which is nonspecific. I see no evidence of other soft tissue injury. Electronically Signed   By: Lorriane Shire M.D.   On: 09/17/2018 14:24   Dg Cerv Spine Flex&ext Only  Result Date: 09/17/2018 CLINICAL DATA:  Head trauma after fall and of the bed this morning. Small amount of prevertebral edema or fluid seen on MRI scan today. EXAM: CERVICAL SPINE - FLEXION AND EXTENSION VIEWS ONLY COMPARISON:  None. FINDINGS: There is slight reversal of the normal cervical lordosis. There is no abnormal subluxation with flexion or extension. Motion in the mid to lower cervical spine is quite limited. IMPRESSION: No abnormal subluxation with flexion or extension. Electronically Signed   By: Lorriane Shire M.D.   On: 09/17/2018 16:05   Dg Abd Portable 1v  Result Date: 09/20/2018 CLINICAL DATA:  Nausea, vomiting. EXAM: PORTABLE ABDOMEN - 1 VIEW COMPARISON:  None. FINDINGS: The bowel gas pattern is normal. No radio-opaque calculi or other significant radiographic abnormality are seen. IMPRESSION: Negative. Electronically Signed   By: Marijo Conception M.D.   On: 09/20/2018 12:54   Ct Maxillofacial Wo Contrast  Result Date: 09/17/2018 CLINICAL DATA:  Facial trauma secondary to a fall out of bed this morning. Scalp hematoma over the left frontal bone. EXAM: CT MAXILLOFACIAL WITHOUT CONTRAST TECHNIQUE: Multidetector CT imaging of the maxillofacial structures was performed. Multiplanar CT image reconstructions were also generated. COMPARISON:  CT scan of the head dated 06/01/2009 FINDINGS: Osseous: There is no fracture. No destructive process. Orbits: Tiny metallic foreign body in the anterior chamber of the left eye, unchanged. Sinuses: Minimal mucosal thickening of the left maxillary  sinus. Otherwise normal. Soft tissues: Scalp hematoma and scalp contusion over the left superior orbital rim. No adjacent fracture. Limited intracranial: Normal. IMPRESSION: 1. No acute abnormality of the maxillofacial structures. Scalp hematoma and contusion over the left superior orbital rim. 2. Tiny metallic foreign body in the anterior chamber of the left eye, unchanged. Electronically Signed   By: Lorriane Shire M.D.   On: 09/17/2018 10:36    Lab Data:  CBC: Recent Labs  Lab 09/17/18 0803 09/18/18 0353 09/19/18 0434 09/20/18 0242  WBC 4.7 5.6 10.4 9.7  NEUTROABS 2.6  --   --   --  HGB 13.0 13.5 13.5 13.4  HCT 40.2 42.3 41.9 41.1  MCV 88.2 88.5 86.7 86.3  PLT 234 250 265 [id number]   Basic Metabolic Panel: Recent Labs  Lab 09/17/18 0803 09/18/18 0353 09/19/18 0434 09/20/18 0242  NA 142 139 139 139  K 3.2* 4.2 4.6 4.1  CL 107 104 106 105  CO2 24 25 24 22   GLUCOSE 133* 151* 145* 130*  BUN 15 14 21  24*  CREATININE 0.85 0.73 0.83 0.77  CALCIUM 8.7* 9.0 9.0 8.6*   GFR: Estimated Creatinine Clearance: 61.7 mL/min (by C-G formula based on SCr of 0.77 mg/dL). Liver Function Tests: Recent Labs  Lab 09/18/18 0353  AST 32  ALT 22  ALKPHOS 74  BILITOT 0.4  PROT 7.1  ALBUMIN 3.6   No results for input(s): LIPASE, AMYLASE in the last 168 hours. No results for input(s): AMMONIA in the last 168 hours. Coagulation Profile: Recent Labs  Lab 09/18/18 0353  INR 1.1   Cardiac Enzymes: No results for input(s): CKTOTAL, CKMB, CKMBINDEX, TROPONINI in the last 168 hours. BNP (last 3 results) No results for input(s): PROBNP in the last 8760 hours. HbA1C: No results for input(s): HGBA1C in the last 72 hours. CBG: No results for input(s): GLUCAP in the last 168 hours. Lipid Profile: No results for input(s): CHOL, HDL, LDLCALC, TRIG, CHOLHDL, LDLDIRECT in the last 72 hours. Thyroid Function Tests: No results for input(s): TSH, T4TOTAL, FREET4, T3FREE, THYROIDAB in the last 72  hours. Anemia Panel: No results for input(s): VITAMINB12, FOLATE, FERRITIN, TIBC, IRON, RETICCTPCT in the last 72 hours. Urine analysis: No results found for: COLORURINE, APPEARANCEUR, LABSPEC, PHURINE, GLUCOSEU, HGBUR, BILIRUBINUR, KETONESUR, PROTEINUR, UROBILINOGEN, NITRITE, LEUKOCYTESUR   Ronny Ruddell M.D. [facility] 09/20/2018, 1:43 PM  Pager: [phone] Between 7am to 7pm - call Pager - [phone]  After 7pm go to [email or web address] - password [email or web address]  Call night coverage person covering after 7pm

## 2018-09-20 NOTE — Care Management Important Message (Signed)
Important Message  Patient Details IM Letter given to Nancy Marus RN to present to the Patient Name: Yvonne Tran MRN: [id number] Date of Birth: 1934/09/10   Medicare Important Message Given:  Yes     Kerin Salen 09/20/2018, 11:43 AM

## 2018-09-20 NOTE — Progress Notes (Signed)
Rai, Ripudeep, MD was paged regarding the pt vomiting. MD ordered PO Zofran and Protonix. Pt vomited immediately after taking the pills. Rai, MD was paged/notified. I will continue to monitor.

## 2018-09-20 NOTE — Progress Notes (Signed)
Physical Therapy Treatment Patient Details Name: Yvonne Tran MRN: [id number] DOB: 03/21/1934 Today's Date: 09/20/2018    History of Present Illness 84 yo female admitted after falling at home. Dx: central cord syndrome with bil UE paresthesia and weakness. MRI (+) C5-C6 stenosis, C3-C4 disc protrusion, both with slight compression of spinal cord    PT Comments    Pt assisted with ambulating short distance and fatigues quickly.  Pt reports d/c today and states she is going to SNF however per nursing, d/c pending equipment delivery at home.  Pt will need assist for mobility for safety at this time.    Follow Up Recommendations  SNF;Home health PT;Supervision/Assistance - 24 hour(per chart, daughter wishes for home?)     Equipment Recommendations  None recommended by PT    Recommendations for Other Services       Precautions / Restrictions Precautions Precautions: Fall Precaution Comments: L side weakness/foot drop (at baseline)    Mobility  Bed Mobility Overal bed mobility: Needs Assistance Bed Mobility: Supine to Sit     Supine to sit: Supervision     General bed mobility comments: supervision for safety  Transfers Overall transfer level: Needs assistance Equipment used: Rolling walker (2 wheeled) Transfers: Sit to/from Stand Sit to Stand: Min assist         General transfer comment: slight assist to rise and steady, verbal cues for safe technique  Ambulation/Gait Ambulation/Gait assistance: Min assist Gait Distance (Feet): 60 Feet Assistive device: Rolling walker (2 wheeled) Gait Pattern/deviations: Step-through pattern;Decreased stride length     General Gait Details: assist to stabilize, distance to tolerance, fatigues quickly   Stairs             Wheelchair Mobility    Modified Rankin (Stroke Patients Only)       Balance                                            Cognition Arousal/Alertness:  Awake/alert Behavior During Therapy: WFL for tasks assessed/performed Overall Cognitive Status: Within Functional Limits for tasks assessed                                        Exercises      General Comments        Pertinent Vitals/Pain Pain Assessment: No/denies pain Pain Intervention(s): Monitored during session;Repositioned    Home Living                      Prior Function            PT Goals (current goals can now be found in the care plan section) Progress towards PT goals: Progressing toward goals    Frequency    Min 3X/week      PT Plan Current plan remains appropriate    Co-evaluation              AM-PAC PT "6 Clicks" Mobility   Outcome Measure  Help needed turning from your back to your side while in a flat bed without using bedrails?: A Little Help needed moving from lying on your back to sitting on the side of a flat bed without using bedrails?: A Little Help needed moving to and from a bed to a chair (including a wheelchair)?:  A Little Help needed standing up from a chair using your arms (e.g., wheelchair or bedside chair)?: A Little Help needed to walk in hospital room?: A Little Help needed climbing 3-5 steps with a railing? : A Lot 6 Click Score: 17    End of Session Equipment Utilized During Treatment: Gait belt Activity Tolerance: Patient tolerated treatment well Patient left: with call bell/phone within reach;in chair Nurse Communication: Mobility status PT Visit Diagnosis: Other abnormalities of gait and mobility (R26.89)     Time: [id number] PT Time Calculation (min) (ACUTE ONLY): 10 min  Charges:  $Gait Training: 8-22 mins                     Carmelia Bake, PT, DPT Acute Rehabilitation Services Office: [phone] Pager: [phone]  Trena Platt 09/20/2018, 1:15 PM

## 2018-09-21 LAB — CBC
HCT: 41.3 % (ref 36.0–46.0)
Hemoglobin: 13.3 g/dL (ref 12.0–15.0)
MCH: 28.2 pg (ref 26.0–34.0)
MCHC: 32.2 g/dL (ref 30.0–36.0)
MCV: 87.5 fL (ref 80.0–100.0)
Platelets: 270 10*3/uL (ref 150–400)
RBC: 4.72 MIL/uL (ref 3.87–5.11)
RDW: 15.6 % — ABNORMAL HIGH (ref 11.5–15.5)
WBC: 8.7 10*3/uL (ref 4.0–10.5)
nRBC: 0 % (ref 0.0–0.2)

## 2018-09-21 LAB — BASIC METABOLIC PANEL
Anion gap: 8 (ref 5–15)
BUN: 24 mg/dL — ABNORMAL HIGH (ref 8–23)
CO2: 24 mmol/L (ref 22–32)
Calcium: 8.6 mg/dL — ABNORMAL LOW (ref 8.9–10.3)
Chloride: 106 mmol/L (ref 98–111)
Creatinine, Ser: 0.79 mg/dL (ref 0.44–1.00)
GFR calc Af Amer: >60 mL/min (ref >60)
GFR calc non Af Amer: >60 mL/min (ref >60)
Glucose, Bld: 112 mg/dL — ABNORMAL HIGH (ref 70–99)
Potassium: 3.8 mmol/L (ref 3.5–5.1)
Sodium: 138 mmol/L (ref 135–145)

## 2018-09-21 MED ORDER — LOSARTAN POTASSIUM 50 MG PO TABS: 50.0000 mg | ORAL_TABLET | Freq: Every day | ORAL | 1 refills | Status: DC

## 2018-09-21 NOTE — Plan of Care (Signed)

## 2018-09-21 NOTE — Progress Notes (Signed)
Occupational Therapy Treatment Patient Details Name: Yvonne Tran MRN: [id number] DOB: 11-25-1934 Today's Date: 09/21/2018    History of present illness 84 yo female admitted after falling at home. Dx: central cord syndrome with bil UE paresthesia and weakness. MRI (+) C5-C6 stenosis, C3-C4 disc protrusion, both with slight compression of spinal cord   OT comments  Pt ambulated to bathroom for toileting. Had a posterior LOB when managing clothing requiring assistance to recover.  Pt doesn't have 24/7.  Per notes, family wants her home. She needs assistance when she mobilizes and performs ADLs.  Worked through [employer], mostly AROM, using squeeze ball and also level one theraband for R biceps/triceps   Follow Up Recommendations  Home health OT;Supervision/Assistance - 24 hour --at minimum assistance for mobility and adls due to increased falls risk   Equipment Recommendations  3 in 1 bedside commode    Recommendations for Other Services      Precautions / Restrictions Precautions Precautions: Fall Precaution Comments: L side weakness/foot drop (at baseline) Restrictions Weight Bearing Restrictions: No       Mobility Bed Mobility         Supine to sit: Supervision     General bed mobility comments: hospital bed; HOB raised and use of rail  Transfers   Equipment used: Rolling walker (2 wheeled)   Sit to Stand: Min assist         General transfer comment: steadying assistance for sit to stand    Balance             Standing balance-Leahy Scale: Poor                             ADL either performed or assessed with clinical judgement   ADL                           Toilet Transfer: Minimal assistance   Toileting- Clothing Manipulation and Hygiene: Moderate assistance         General ADL Comments: ambulated to bathroom with min guard once standing.  Had LOB when trying to manage clothing     Vision       Perception      Praxis      Cognition Arousal/Alertness: Awake/alert Behavior During Therapy: WFL for tasks assessed/performed Overall Cognitive Status: Within Functional Limits for tasks assessed                                          Exercises Exercises: Other exercises Other Exercises Other Exercises: worked through [employer].  AROM bil shoulders to tolerance.  R biceps, level one t-band and triceps with someone holding band. Other Exercises: LUE AROM for biceps/triceps.  Band made arm hurt more Other Exercises: squeeze ball bil   Shoulder Instructions       General Comments      Pertinent Vitals/ Pain       Pain Score: 5  Pain Location: face and LUE Pain Descriptors / Indicators: Burning;Discomfort Pain Intervention(s): Limited activity within patient's tolerance;Monitored during session;Repositioned  Home Living                                          Prior Functioning/Environment  Frequency           Progress Toward Goals  OT Goals(current goals can now be found in the care plan section)  Progress towards OT goals: Progressing toward goals  Acute Rehab OT Goals OT Goal Formulation: With patient  Plan      Co-evaluation                 AM-PAC OT "6 Clicks" Daily Activity     Outcome Measure   Help from another person eating meals?: A Little Help from another person taking care of personal grooming?: A Little Help from another person toileting, which includes using toliet, bedpan, or urinal?: A Little Help from another person bathing (including washing, rinsing, drying)?: A Lot Help from another person to put on and taking off regular upper body clothing?: A Little Help from another person to put on and taking off regular lower body clothing?: A Lot 6 Click Score: 16    End of Session    OT Visit Diagnosis: Unsteadiness on feet (R26.81);Other abnormalities of gait and mobility (R26.89);Muscle weakness  (generalized) (M62.81);History of falling (Z91.81);Pain Pain - Right/Left: Left Pain - part of body: Arm   Activity Tolerance Patient tolerated treatment well   Patient Left     Nurse Communication          Time: [id number] OT Time Calculation (min): 36 min  Charges: OT General Charges $OT Visit: 1 Visit  Lesle Chris, OTR/L Acute Rehabilitation Services [phone] [facility] pager [phone] office 09/21/2018   [place] 09/21/2018, 3:38 PM

## 2018-09-21 NOTE — Discharge Summary (Signed)
Yvonne Tran [id number] DOB: 14-Aug-1934 DOA: 09/17/2018  PCP: Cari Caraway, MD  Admit date: 09/17/2018 Discharge date: 09/21/2018  Admitted From:Home Disposition:  Home  Recommendations for Outpatient Follow-up:  1. Follow up with Neurosurgery in 1-2 weeks 2. PCP follow up for BP check and BMP ( started on losartan, increased amlodipine to 10 mg) 3. New medications: Decadron, Gabapentin, losartan, protonix 4. :  Home Health:PT  Equipment/Devices:none  Discharge Condition:STable  CODE STATUS: FULL Diet recommendation: Heart Healthy  Brief/Interim Summary: History of present illness:  Yvonne Tran is a 84 y.o. year old female with medical history significant for PVD, ileal stents, CAD, multinodular goiter status post resection 08/2017, hypothyroidism history of back surgery (1980s) which failed and had severe inability to move left side of her body which uses a walker and cane who presented on 09/17/2018 with a fall.  Patient was found down and reportedly this was due to be oxygen as she fell out of her baseline on the left side of her face hitting her head and forehead with resultant severe pain upper extremities as well as weakness and tingling.  In the ED patient was placed in c-collar by EDP.  This was removed after negative cervical flexion/extension x-rays.  Remaining hospital course addressed in problem based format below:   Hospital Course:    C5-C6 C spinal stenosis.  Presented after reported fall from the bed with resultant bilateral upper extremity pain, weakness, numbness and tingling.  CT cervical spine showed disc space narrowing of C5 9 shows slight compression of spinal cord at C5-C6 without myelopathy and no fractures her hand weakness and dysesthesias improved with Decadron and gabapentin.  Patient was evaluated by neurosurgery, Dr. Vertell Limber, who recommended to patient and her daughter to consider decompressive surgery of the C5-6 level.  The patient  and family would like to discuss, neurosurgery does not believe this is an acute issue and can be related just as an outpatient in a few weeks discharge patient will continue Neurontin 300 3 times daily, oral Decadron 2 mg daily until follow up in a week. PT recommended SNF but patient prefers going home with daughter, home health PT was provided.   Fall.  CT maxillofacial showed no acute abnormalities other than scalp hematoma and contusion of left superior orbital rim. Patient has residual weakness in left side of body from remote back surgeries.   Nausea vomiting, resolved.  Abdominal x-ray unremarkable.  Improved with supportive care. Sent home with bowel regimen as well as protonix.   HTN SBP elevated to 180s before increasing home amlodipine to 10 mg and adding losartan 50 mg daily and improved to SBP to 140s. Advised close follow up with her PCP.   Consultations:  Neurosurgery  Procedures/Studies: none Subjective: Ate breakfast. Did well. Had BM yesterday morning. Still some burning in her hands but not worse than yesterday  Discharge Exam: Vitals:   09/20/18 2008 09/21/18 0800  BP: (!) 146/62 (!) 142/58  Pulse: (!) 56 (!) 50  Resp: 16 18  Temp: 97.8 F (36.6 C) 98.2 F (36.8 C)  SpO2: 98% 98%   Vitals:   09/20/18 1327 09/20/18 2008 09/21/18 0800 09/21/18 0830  BP: (!) 144/62 (!) 146/62 (!) 142/58   Pulse: (!) 47 (!) 56 (!) 50   Resp: 16 16 18    Temp: 97.6 F (36.4 C) 97.8 F (36.6 C) 98.2 F (36.8 C)   TempSrc: Oral Oral Oral   SpO2: 100% 98% 98%   Weight:  92 kg  Height:        General: Lying in bed, no apparent distress Eyes: EOMI, anicteric ENT: Oral Mucosa clear and moist Cardiovascular: bradycardic, regular rhythm, no murmurs, rubs or gallops, no edema, Respiratory: Normal respiratory effort, Abdomen: soft, non-distended, non-tender, normal bowel sounds Skin: hematoma left periorbital area Neurologic: 4/5 strength in upper extremities bilaterally, 5/5  in right lower extremity 4/5 in left lower extremity.Mental status AAOx3, speech normal, Psychiatric:Appropriate affect, and mood  Discharge Diagnoses:  Principal Problem:   Central cord syndrome at C5 level of cervical spinal cord (HCC) Active Problems:   Hypothyroidism, postsurgical   Mixed hyperlipidemia   Essential hypertension, benign   PAD (peripheral artery disease) ([place])    Discharge Instructions  Discharge Instructions    Diet - low sodium heart healthy   Complete by: As directed    Diet - low sodium heart healthy   Complete by: As directed    Increase activity slowly   Complete by: As directed    Increase activity slowly   Complete by: As directed      Allergies as of 09/21/2018      Reactions   Crestor [rosuvastatin]    MYALGIAS   Eggs Or Egg-derived Products Itching, Nausea Only, Swelling   Lemon Juice Itching, Nausea Only, Swelling   Milk-related Compounds Itching, Nausea Only, Swelling   Repatha [evolocumab]    Leg swelling and aches   Shellfish Allergy Itching, Nausea Only, Swelling      Medication List    TAKE these medications   amLODipine 10 MG tablet Commonly known as: NORVASC Take 1 tablet (10 mg total) by mouth daily. What changed:   medication strength  how much to take   aspirin EC 81 MG tablet Take 81 mg by mouth every morning.   BIOTIN PO Take 1 tablet by mouth daily.   Centrum Silver 50+Women Tabs Take 1 tablet by mouth daily.   Combigan 0.2-0.5 % ophthalmic solution Generic drug: brimonidine-timolol Place 1 drop into the right eye 2 (two) times daily.   dexamethasone 2 MG tablet Commonly known as: Decadron Take 1 tablet (2 mg total) by mouth 2 (two) times daily for 10 days.   docusate sodium 100 MG capsule Commonly known as: COLACE Take 1 capsule (100 mg total) by mouth 2 (two) times daily. Also available over the counter   gabapentin 300 MG capsule Commonly known as: NEURONTIN Take 1 capsule (300 mg total) by mouth 3  (three) times daily.   HYDROcodone-acetaminophen 5-325 MG tablet Commonly known as: NORCO/VICODIN Take 1 tablet by mouth every 6 (six) hours as needed for up to 7 days for severe pain.   levothyroxine 88 MCG tablet Commonly known as: SYNTHROID Take 88 mcg by mouth daily before breakfast.   losartan 50 MG tablet Commonly known as: COZAAR Take 1 tablet (50 mg total) by mouth daily.   Lumigan 0.01 % Soln Generic drug: bimatoprost Place 1 drop into the right eye at bedtime.   nitroGLYCERIN 0.4 MG SL tablet Commonly known as: NITROSTAT Place 1 tablet (0.4 mg total) under the tongue every 5 (five) minutes as needed. For chest pain.   ondansetron 4 MG disintegrating tablet Commonly known as: Zofran ODT Take 1 tablet (4 mg total) by mouth every 8 (eight) hours as needed for nausea or vomiting.   pantoprazole 40 MG tablet Commonly known as: PROTONIX Take 1 tablet (40 mg total) by mouth daily. While taking decadron   polyethylene glycol 17 g packet Commonly  known as: MIRALAX / GLYCOLAX Take 17 g by mouth daily as needed for moderate constipation. Also available OTC   RHOPRESSA OP Place 1 drop into the right eye at bedtime.   Systane Balance 0.6 % Soln Generic drug: Propylene Glycol Apply 1 drop to eye 2 (two) times daily as needed (dry eyes).   vitamin B-12 100 MCG tablet Commonly known as: CYANOCOBALAMIN Take 100 mcg by mouth daily.            Durable Medical Equipment  (From admission, onward)         Start     Ordered   09/20/18 0948  For home use only DME Hospital bed  Once    Question Answer Comment  Length of Need 6 Months   The above medical condition requires: Patient requires the ability to reposition frequently   Head must be elevated greater than: 30 degrees   Bed type Semi-electric   Support Surface: Gel Overlay      09/20/18 0948   09/20/18 0622  For home use only DME 3 n 1  Once     09/20/18 [id number]         Follow-up Information    Erline Levine, MD. Schedule an appointment as soon as possible for a visit in 1 week(s).   Specialty: Neurosurgery Why: please call to make appointment in 7-10 days for follow-up and surgery Contact information: [address] [place] [address] [place] [address] [phone]        Cari Caraway, MD. Schedule an appointment as soon as possible for a visit in 2 week(s).   Specialty: Family Medicine Contact information: [place] [address] [place], [facility] Follow up.   Specialty: Home Health Services Why: agency will provide home health physical and occupational therapy. agency will call you to schedule first visit. Contact information: [place] [address] [phone]          Allergies  Allergen Reactions  . Crestor [Rosuvastatin]     MYALGIAS  . Eggs Or Egg-Derived Products Itching, Nausea Only and Swelling  . Lemon Juice Itching, Nausea Only and Swelling  . Milk-Related Compounds Itching, Nausea Only and Swelling  . Repatha [Evolocumab]     Leg swelling and aches  . Shellfish Allergy Itching, Nausea Only and Swelling        The results of significant diagnostics from this hospitalization (including imaging, microbiology, ancillary and laboratory) are listed below for reference.     Microbiology: Recent Results (from the past 240 hour(s))  SARS Coronavirus 2 [facility] order, Performed in [facility] hospital lab) Nasopharyngeal Nasopharyngeal Swab     Status: None   Collection Time: 09/17/18  6:12 PM   Specimen: Nasopharyngeal Swab  Result Value Ref Range Status   SARS Coronavirus 2 NEGATIVE NEGATIVE Final    Comment: (NOTE) If result is NEGATIVE SARS-CoV-2 target nucleic acids are NOT DETECTED. The SARS-CoV-2 RNA is generally detectable in upper and lower  respiratory specimens during the acute phase of infection. The lowest  concentration of SARS-CoV-2 viral copies this assay can  detect is 250  copies / mL. A negative result does not preclude SARS-CoV-2 infection  and should not be used as the sole basis for treatment or other  patient management decisions.  A negative result may occur with  improper specimen collection / handling, submission of specimen other  than nasopharyngeal swab, presence of  viral mutation(s) within the  areas targeted by this assay, and inadequate number of viral copies  (<250 copies / mL). A negative result must be combined with clinical  observations, patient history, and epidemiological information. If result is POSITIVE SARS-CoV-2 target nucleic acids are DETECTED. The SARS-CoV-2 RNA is generally detectable in upper and lower  respiratory specimens dur ing the acute phase of infection.  Positive  results are indicative of active infection with SARS-CoV-2.  Clinical  correlation with patient history and other diagnostic information is  necessary to determine patient infection status.  Positive results do  not rule out bacterial infection or co-infection with other viruses. If result is PRESUMPTIVE POSTIVE SARS-CoV-2 nucleic acids MAY BE PRESENT.   A presumptive positive result was obtained on the submitted specimen  and confirmed on repeat testing.  While 2019 novel coronavirus  (SARS-CoV-2) nucleic acids may be present in the submitted sample  additional confirmatory testing may be necessary for epidemiological  and / or clinical management purposes  to differentiate between  SARS-CoV-2 and other Sarbecovirus currently known to infect humans.  If clinically indicated additional testing with an alternate test  methodology [phone]) is advised. The SARS-CoV-2 RNA is generally  detectable in upper and lower respiratory sp ecimens during the acute  phase of infection. The expected result is Negative. Fact Sheet for Patients:  StrictlyIdeas.no Fact Sheet for Healthcare  Providers: BankingDealers.co.za This test is not yet approved or cleared by the [place] FDA and has been authorized for detection and/or diagnosis of SARS-CoV-2 by FDA under an Emergency Use Authorization (EUA).  This EUA will remain in effect (meaning this test can be used) for the duration of the COVID-19 declaration under Section 564(b)(1) of the Act, 21 U.S.C. section 360bbb-3(b)(1), unless the authorization is terminated or revoked sooner. Performed at [facility], [place] [address] [place], [place] [address]      Labs: BNP (last 3 results) No results for input(s): BNP in the last 8760 hours. Basic Metabolic Panel: Recent Labs  Lab 09/17/18 0803 09/18/18 0353 09/19/18 0434 09/20/18 0242 09/21/18 0221  NA 142 139 139 139 138  K 3.2* 4.2 4.6 4.1 3.8  CL 107 104 106 105 106  CO2 24 25 24 22 24   GLUCOSE 133* 151* 145* 130* 112*  BUN 15 14 21  24* 24*  CREATININE 0.85 0.73 0.83 0.77 0.79  CALCIUM 8.7* 9.0 9.0 8.6* 8.6*   Liver Function Tests: Recent Labs  Lab 09/18/18 0353  AST 32  ALT 22  ALKPHOS 74  BILITOT 0.4  PROT 7.1  ALBUMIN 3.6   No results for input(s): LIPASE, AMYLASE in the last 168 hours. No results for input(s): AMMONIA in the last 168 hours. CBC: Recent Labs  Lab 09/17/18 0803 09/18/18 0353 09/19/18 0434 09/20/18 0242 09/21/18 0221  WBC 4.7 5.6 10.4 9.7 8.7  NEUTROABS 2.6  --   --   --   --   HGB 13.0 13.5 13.5 13.4 13.3  HCT 40.2 42.3 41.9 41.1 41.3  MCV 88.2 88.5 86.7 86.3 87.5  PLT 234 250 265 270 270   Cardiac Enzymes: No results for input(s): CKTOTAL, CKMB, CKMBINDEX, TROPONINI in the last 168 hours. BNP: Invalid input(s): POCBNP CBG: No results for input(s): GLUCAP in the last 168 hours. D-Dimer No results for input(s): DDIMER in the last 72 hours. Hgb A1c No results for input(s): HGBA1C in the last 72 hours. Lipid Profile No results for input(s): CHOL, HDL, LDLCALC, TRIG, CHOLHDL,  LDLDIRECT  in the last 72 hours. Thyroid function studies No results for input(s): TSH, T4TOTAL, T3FREE, THYROIDAB in the last 72 hours.  Invalid input(s): FREET3 Anemia work up No results for input(s): VITAMINB12, FOLATE, FERRITIN, TIBC, IRON, RETICCTPCT in the last 72 hours. Urinalysis No results found for: COLORURINE, APPEARANCEUR, LABSPEC, [place], GLUCOSEU, [place], [place], KETONESUR, PROTEINUR, UROBILINOGEN, NITRITE, LEUKOCYTESUR Sepsis Labs Invalid input(s): PROCALCITONIN,  WBC,  [place] Microbiology Recent Results (from the past 240 hour(s))  SARS Coronavirus 2 [facility] order, Performed in [facility] hospital lab) Nasopharyngeal Nasopharyngeal Swab     Status: None   Collection Time: 09/17/18  6:12 PM   Specimen: Nasopharyngeal Swab  Result Value Ref Range Status   SARS Coronavirus 2 NEGATIVE NEGATIVE Final    Comment: (NOTE) If result is NEGATIVE SARS-CoV-2 target nucleic acids are NOT DETECTED. The SARS-CoV-2 RNA is generally detectable in upper and lower  respiratory specimens during the acute phase of infection. The lowest  concentration of SARS-CoV-2 viral copies this assay can detect is 250  copies / mL. A negative result does not preclude SARS-CoV-2 infection  and should not be used as the sole basis for treatment or other  patient management decisions.  A negative result may occur with  improper specimen collection / handling, submission of specimen other  than nasopharyngeal swab, presence of viral mutation(s) within the  areas targeted by this assay, and inadequate number of viral copies  (<250 copies / mL). A negative result must be combined with clinical  observations, patient history, and epidemiological information. If result is POSITIVE SARS-CoV-2 target nucleic acids are DETECTED. The SARS-CoV-2 RNA is generally detectable in upper and lower  respiratory specimens dur ing the acute phase of infection.  Positive  results are indicative of active  infection with SARS-CoV-2.  Clinical  correlation with patient history and other diagnostic information is  necessary to determine patient infection status.  Positive results do  not rule out bacterial infection or co-infection with other viruses. If result is PRESUMPTIVE POSTIVE SARS-CoV-2 nucleic acids MAY BE PRESENT.   A presumptive positive result was obtained on the submitted specimen  and confirmed on repeat testing.  While 2019 novel coronavirus  (SARS-CoV-2) nucleic acids may be present in the submitted sample  additional confirmatory testing may be necessary for epidemiological  and / or clinical management purposes  to differentiate between  SARS-CoV-2 and other Sarbecovirus currently known to infect humans.  If clinically indicated additional testing with an alternate test  methodology [phone]) is advised. The SARS-CoV-2 RNA is generally  detectable in upper and lower respiratory sp ecimens during the acute  phase of infection. The expected result is Negative. Fact Sheet for Patients:  StrictlyIdeas.no Fact Sheet for Healthcare Providers: BankingDealers.co.za This test is not yet approved or cleared by the [place] FDA and has been authorized for detection and/or diagnosis of SARS-CoV-2 by FDA under an Emergency Use Authorization (EUA).  This EUA will remain in effect (meaning this test can be used) for the duration of the COVID-19 declaration under Section 564(b)(1) of the Act, 21 U.S.C. section 360bbb-3(b)(1), unless the authorization is terminated or revoked sooner. Performed at [facility], [place] [address] [place], [place] [address]      Time coordinating discharge: Over 30 minutes  SIGNED:   Desiree Hane, MD  [facility] 09/21/2018, 10:06 AM Pager   If 7PM-7AM, please contact night-coverage [email or web address] Password [email or web address]

## 2018-09-21 NOTE — TOC Transition Note (Signed)
Transition of Care [facility]) - CM/SW Discharge Note   Patient Details  Name: Yvonne Tran MRN: [id number] Date of Birth: Sep 02, 1934  Transition of Care [facility]) CM/SW Contact:  Leeroy Cha, RN Phone Number: 09/21/2018, 10:12 AM   Clinical Narrative:    Discharge to home with daughter and [facility]ada for hhc.  dme delivered. Hospital bed and 3 in 1.     Barriers to Discharge: Continued Medical Work up   Patient Goals and CMS Choice Patient states their goals for this hospitalization and ongoing recovery are:: to go home CMS Medicare.gov Compare Post Acute Care list provided to:: Patient Choice offered to / list presented to : Patient  Discharge Placement                       Discharge Plan and Services   Discharge Planning Services: CM Consult Post Acute Care Choice: Home Health          DME Arranged: Hospital bed, 3-N-1 DME Agency: [employer] Date DME Agency Contacted: 09/20/18 Time DME Agency Contacted: [id number] Representative spoke with at DME Agency: [place]: OT, PT [place] Agency: [place] Date [place]: 09/20/18 Time [place]: [place] Representative spoke with at [place]: [place] ([place]) Interventions     Readmission Risk Interventions No flowsheet data found.

## 2018-09-21 NOTE — Plan of Care (Signed)
  Problem: Education: Goal: Knowledge of General Education information will improve Description: Including pain rating scale, medication(s)/side effects and non-pharmacologic comfort measures 09/21/2018 2028 by Hubert Azure, RN Outcome: Adequate for Discharge 09/21/2018 1120 by Hubert Azure, RN Outcome: Adequate for Discharge   Problem: Health Behavior/Discharge Planning: Goal: Ability to manage health-related needs will improve 09/21/2018 2028 by Hubert Azure, RN Outcome: Adequate for Discharge 09/21/2018 1120 by Hubert Azure, RN Outcome: Adequate for Discharge   Problem: Clinical Measurements: Goal: Ability to maintain clinical measurements within normal limits will improve 09/21/2018 2028 by Hubert Azure, RN Outcome: Adequate for Discharge 09/21/2018 1120 by Hubert Azure, RN Outcome: Adequate for Discharge Goal: Will remain free from infection 09/21/2018 2028 by Hubert Azure, RN Outcome: Adequate for Discharge 09/21/2018 1120 by Hubert Azure, RN Outcome: Adequate for Discharge Goal: Diagnostic test results will improve 09/21/2018 2028 by Hubert Azure, RN Outcome: Adequate for Discharge 09/21/2018 1120 by Hubert Azure, RN Outcome: Adequate for Discharge Goal: Respiratory complications will improve 09/21/2018 2028 by Hubert Azure, RN Outcome: Adequate for Discharge 09/21/2018 1120 by Hubert Azure, RN Outcome: Adequate for Discharge Goal: Cardiovascular complication will be avoided 09/21/2018 2028 by Hubert Azure, RN Outcome: Adequate for Discharge 09/21/2018 1120 by Hubert Azure, RN Outcome: Adequate for Discharge   Problem: Activity: Goal: Risk for activity intolerance will decrease 09/21/2018 2028 by Hubert Azure, RN Outcome: Adequate for Discharge 09/21/2018 1120 by Hubert Azure, RN Outcome: Adequate for Discharge   Problem: Nutrition: Goal: Adequate nutrition will be maintained 09/21/2018 2028 by Hubert Azure, RN Outcome:  Adequate for Discharge 09/21/2018 1120 by Hubert Azure, RN Outcome: Adequate for Discharge   Problem: Coping: Goal: Level of anxiety will decrease 09/21/2018 2028 by Hubert Azure, RN Outcome: Adequate for Discharge 09/21/2018 1120 by Hubert Azure, RN Outcome: Adequate for Discharge   Problem: Elimination: Goal: Will not experience complications related to bowel motility 09/21/2018 2028 by Hubert Azure, RN Outcome: Adequate for Discharge 09/21/2018 1120 by Hubert Azure, RN Outcome: Adequate for Discharge Goal: Will not experience complications related to urinary retention 09/21/2018 2028 by Hubert Azure, RN Outcome: Adequate for Discharge 09/21/2018 1120 by Hubert Azure, RN Outcome: Adequate for Discharge   Problem: Pain Managment: Goal: General experience of comfort will improve 09/21/2018 2028 by Hubert Azure, RN Outcome: Adequate for Discharge 09/21/2018 1120 by Hubert Azure, RN Outcome: Adequate for Discharge   Problem: Safety: Goal: Ability to remain free from injury will improve 09/21/2018 2028 by Hubert Azure, RN Outcome: Adequate for Discharge 09/21/2018 1120 by Hubert Azure, RN Outcome: Adequate for Discharge   Problem: Skin Integrity: Goal: Risk for impaired skin integrity will decrease 09/21/2018 2028 by Hubert Azure, RN Outcome: Adequate for Discharge 09/21/2018 1120 by Hubert Azure, RN Outcome: Adequate for Discharge

## 2018-09-21 NOTE — Progress Notes (Signed)
   09/21/18 1542  OT Visit Information  Last OT Received On 09/21/18  Assistance Needed +1  History of Present Illness 84 yo female admitted after falling at home. Dx: central cord syndrome with bil UE paresthesia and weakness. MRI (+) C5-C6 stenosis, C3-C4 disc protrusion, both with slight compression of spinal cord  Precautions  Precautions Fall  Precaution Comments L side weakness/foot drop (at baseline)  Pain Assessment  Pain Score 5  Pain Location face and LUE  Pain Descriptors / Indicators Burning;Discomfort  Pain Intervention(s) Limited activity within patient's tolerance;Monitored during session  Cognition  Arousal/Alertness Awake/alert  Behavior During Therapy WFL for tasks assessed/performed  Overall Cognitive Status Within Functional Limits for tasks assessed  Restrictions  Weight Bearing Restrictions No  Other Exercises  Other Exercises provided written HEP and worked through [facility] - End of Session  Activity Tolerance Patient tolerated treatment well  Patient left in bed;with call bell/phone within reach;with bed alarm set  OT Assessment/Plan  OT Visit Diagnosis Unsteadiness on feet (R26.81);Other abnormalities of gait and mobility (R26.89);Muscle weakness (generalized) (M62.81);History of falling (Z91.81);Pain  Pain - Right/Left Left  Pain - part of body Arm  Follow Up Recommendations Home health OT;Supervision/Assistance - 24 hour  OT Equipment 3 in 1 bedside commode  AM-PAC OT "6 Clicks" Daily Activity Outcome Measure (Version 2)  Help from another person eating meals? 3  Help from another person taking care of personal grooming? 3  Help from another person toileting, which includes using toliet, bedpan, or urinal? 3  Help from another person bathing (including washing, rinsing, drying)? 2  Help from another person to put on and taking off regular upper body clothing? 3  Help from another person to put on and taking off regular lower body clothing? 2  6 Click  Score 16  OT Goal Progression  Progress towards OT goals Progressing toward goals  OT Time Calculation  OT Start Time (ACUTE ONLY) 1407  OT Stop Time (ACUTE ONLY) 1415  OT Time Calculation (min) 8 min  OT General Charges  $OT Visit 1 Visit  OT Treatments  $Therapeutic Exercise 8-22 mins  Lesle Chris, OTR/L Acute Rehabilitation Services [phone] [facility] pager [phone] office 09/21/2018

## 2018-09-22 DIAGNOSIS — M5134 Other intervertebral disc degeneration, thoracic region: Secondary | ICD-10-CM | POA: Diagnosis not present

## 2018-09-22 DIAGNOSIS — E876 Hypokalemia: Secondary | ICD-10-CM | POA: Diagnosis not present

## 2018-09-22 DIAGNOSIS — R202 Paresthesia of skin: Secondary | ICD-10-CM | POA: Diagnosis not present

## 2018-09-22 DIAGNOSIS — D734 Cyst of spleen: Secondary | ICD-10-CM | POA: Diagnosis not present

## 2018-09-22 DIAGNOSIS — M2578 Osteophyte, vertebrae: Secondary | ICD-10-CM | POA: Diagnosis not present

## 2018-09-22 DIAGNOSIS — I10 Essential (primary) hypertension: Secondary | ICD-10-CM | POA: Diagnosis not present

## 2018-09-22 DIAGNOSIS — Z79891 Long term (current) use of opiate analgesic: Secondary | ICD-10-CM | POA: Diagnosis not present

## 2018-09-22 DIAGNOSIS — H548 Legal blindness, as defined in USA: Secondary | ICD-10-CM | POA: Diagnosis not present

## 2018-09-22 DIAGNOSIS — Z7982 Long term (current) use of aspirin: Secondary | ICD-10-CM | POA: Diagnosis not present

## 2018-09-22 DIAGNOSIS — S42125D Nondisplaced fracture of acromial process, left shoulder, subsequent encounter for fracture with routine healing: Secondary | ICD-10-CM | POA: Diagnosis not present

## 2018-09-22 DIAGNOSIS — M4802 Spinal stenosis, cervical region: Secondary | ICD-10-CM | POA: Diagnosis not present

## 2018-09-22 DIAGNOSIS — M79605 Pain in left leg: Secondary | ICD-10-CM | POA: Diagnosis not present

## 2018-09-22 DIAGNOSIS — K449 Diaphragmatic hernia without obstruction or gangrene: Secondary | ICD-10-CM | POA: Diagnosis not present

## 2018-09-22 DIAGNOSIS — M47813 Spondylosis without myelopathy or radiculopathy, cervicothoracic region: Secondary | ICD-10-CM | POA: Diagnosis not present

## 2018-09-22 DIAGNOSIS — Z86018 Personal history of other benign neoplasm: Secondary | ICD-10-CM | POA: Diagnosis not present

## 2018-09-22 DIAGNOSIS — H409 Unspecified glaucoma: Secondary | ICD-10-CM | POA: Diagnosis not present

## 2018-09-22 DIAGNOSIS — Z8709 Personal history of other diseases of the respiratory system: Secondary | ICD-10-CM | POA: Diagnosis not present

## 2018-09-22 DIAGNOSIS — Z9861 Coronary angioplasty status: Secondary | ICD-10-CM | POA: Diagnosis not present

## 2018-09-22 DIAGNOSIS — E890 Postprocedural hypothyroidism: Secondary | ICD-10-CM | POA: Diagnosis not present

## 2018-09-22 DIAGNOSIS — Z95820 Peripheral vascular angioplasty status with implants and grafts: Secondary | ICD-10-CM | POA: Diagnosis not present

## 2018-09-22 DIAGNOSIS — I739 Peripheral vascular disease, unspecified: Secondary | ICD-10-CM | POA: Diagnosis not present

## 2018-09-22 DIAGNOSIS — S0083XD Contusion of other part of head, subsequent encounter: Secondary | ICD-10-CM | POA: Diagnosis not present

## 2018-09-22 DIAGNOSIS — I25119 Atherosclerotic heart disease of native coronary artery with unspecified angina pectoris: Secondary | ICD-10-CM | POA: Diagnosis not present

## 2018-09-22 DIAGNOSIS — E782 Mixed hyperlipidemia: Secondary | ICD-10-CM | POA: Diagnosis not present

## 2018-09-22 DIAGNOSIS — S0003XD Contusion of scalp, subsequent encounter: Secondary | ICD-10-CM | POA: Diagnosis not present

## 2018-09-23 DIAGNOSIS — M4802 Spinal stenosis, cervical region: Secondary | ICD-10-CM | POA: Diagnosis not present

## 2018-09-23 DIAGNOSIS — I10 Essential (primary) hypertension: Secondary | ICD-10-CM | POA: Diagnosis not present

## 2018-09-23 DIAGNOSIS — E039 Hypothyroidism, unspecified: Secondary | ICD-10-CM | POA: Diagnosis not present

## 2018-09-23 DIAGNOSIS — R296 Repeated falls: Secondary | ICD-10-CM | POA: Diagnosis not present

## 2018-09-23 DIAGNOSIS — E785 Hyperlipidemia, unspecified: Secondary | ICD-10-CM | POA: Diagnosis not present

## 2018-09-23 DIAGNOSIS — M79662 Pain in left lower leg: Secondary | ICD-10-CM | POA: Diagnosis not present

## 2018-09-23 DIAGNOSIS — S14125A Central cord syndrome at C5 level of cervical spinal cord, initial encounter: Secondary | ICD-10-CM | POA: Diagnosis not present

## 2018-09-25 ENCOUNTER — Other Ambulatory Visit: Payer: Self-pay

## 2018-09-25 DIAGNOSIS — I10 Essential (primary) hypertension: Secondary | ICD-10-CM | POA: Insufficient documentation

## 2018-09-25 DIAGNOSIS — M79662 Pain in left lower leg: Secondary | ICD-10-CM | POA: Diagnosis not present

## 2018-09-25 DIAGNOSIS — Z79899 Other long term (current) drug therapy: Secondary | ICD-10-CM | POA: Diagnosis not present

## 2018-09-25 DIAGNOSIS — M7732 Calcaneal spur, left foot: Secondary | ICD-10-CM | POA: Diagnosis not present

## 2018-09-25 DIAGNOSIS — Z7901 Long term (current) use of anticoagulants: Secondary | ICD-10-CM | POA: Diagnosis not present

## 2018-09-25 DIAGNOSIS — R42 Dizziness and giddiness: Secondary | ICD-10-CM | POA: Insufficient documentation

## 2018-09-25 DIAGNOSIS — Z7982 Long term (current) use of aspirin: Secondary | ICD-10-CM | POA: Diagnosis not present

## 2018-09-25 DIAGNOSIS — I82432 Acute embolism and thrombosis of left popliteal vein: Secondary | ICD-10-CM | POA: Diagnosis not present

## 2018-09-25 DIAGNOSIS — S8992XA Unspecified injury of left lower leg, initial encounter: Secondary | ICD-10-CM | POA: Diagnosis not present

## 2018-09-25 DIAGNOSIS — M79605 Pain in left leg: Secondary | ICD-10-CM | POA: Diagnosis present

## 2018-09-26 ENCOUNTER — Emergency Department (HOSPITAL_COMMUNITY)
Admission: EM | Admit: 2018-09-26 | Discharge: 2018-09-26 | Disposition: A | Discharge status: Home / Self Care | Payer: PPO | Attending: Emergency Medicine | Admitting: Emergency Medicine

## 2018-09-26 ENCOUNTER — Ambulatory Visit (HOSPITAL_BASED_OUTPATIENT_CLINIC_OR_DEPARTMENT_OTHER): Payer: PPO

## 2018-09-26 ENCOUNTER — Emergency Department (HOSPITAL_COMMUNITY): Payer: PPO

## 2018-09-26 ENCOUNTER — Other Ambulatory Visit: Payer: Self-pay

## 2018-09-26 ENCOUNTER — Encounter (HOSPITAL_COMMUNITY): Payer: Self-pay

## 2018-09-26 DIAGNOSIS — M79609 Pain in unspecified limb: Secondary | ICD-10-CM | POA: Diagnosis not present

## 2018-09-26 DIAGNOSIS — I10 Essential (primary) hypertension: Secondary | ICD-10-CM | POA: Diagnosis not present

## 2018-09-26 DIAGNOSIS — M7989 Other specified soft tissue disorders: Secondary | ICD-10-CM | POA: Diagnosis not present

## 2018-09-26 DIAGNOSIS — M4802 Spinal stenosis, cervical region: Secondary | ICD-10-CM | POA: Diagnosis not present

## 2018-09-26 DIAGNOSIS — M79662 Pain in left lower leg: Secondary | ICD-10-CM | POA: Diagnosis not present

## 2018-09-26 DIAGNOSIS — I82432 Acute embolism and thrombosis of left popliteal vein: Secondary | ICD-10-CM

## 2018-09-26 DIAGNOSIS — M79605 Pain in left leg: Secondary | ICD-10-CM

## 2018-09-26 DIAGNOSIS — S8992XA Unspecified injury of left lower leg, initial encounter: Secondary | ICD-10-CM | POA: Diagnosis not present

## 2018-09-26 DIAGNOSIS — M7732 Calcaneal spur, left foot: Secondary | ICD-10-CM | POA: Diagnosis not present

## 2018-09-26 LAB — CBC WITH DIFFERENTIAL/PLATELET
Abs Immature Granulocytes: 0.1 10*3/uL — ABNORMAL HIGH (ref 0.00–0.07)
Basophils Absolute: 0 10*3/uL (ref 0.0–0.1)
Basophils Relative: 0 %
Eosinophils Absolute: 0.1 10*3/uL (ref 0.0–0.5)
Eosinophils Relative: 2 %
HCT: 40.4 % (ref 36.0–46.0)
Hemoglobin: 12.9 g/dL (ref 12.0–15.0)
Immature Granulocytes: 1 %
Lymphocytes Relative: 29 %
Lymphs Abs: 2.1 10*3/uL (ref 0.7–4.0)
MCH: 28.5 pg (ref 26.0–34.0)
MCHC: 31.9 g/dL (ref 30.0–36.0)
MCV: 89.2 fL (ref 80.0–100.0)
Monocytes Absolute: 0.7 10*3/uL (ref 0.1–1.0)
Monocytes Relative: 9 %
Neutro Abs: 4.2 10*3/uL (ref 1.7–7.7)
Neutrophils Relative %: 59 %
Platelets: 204 10*3/uL (ref 150–400)
RBC: 4.53 MIL/uL (ref 3.87–5.11)
RDW: 16.1 % — ABNORMAL HIGH (ref 11.5–15.5)
WBC: 7.3 10*3/uL (ref 4.0–10.5)
nRBC: 0 % (ref 0.0–0.2)

## 2018-09-26 LAB — URINALYSIS, ROUTINE W REFLEX MICROSCOPIC
Bilirubin Urine: NEGATIVE
Glucose, UA: NEGATIVE mg/dL
Hgb urine dipstick: NEGATIVE
Ketones, ur: NEGATIVE mg/dL
Nitrite: NEGATIVE
Protein, ur: NEGATIVE mg/dL
Specific Gravity, Urine: 1.023 (ref 1.005–1.030)
pH: 5 (ref 5.0–8.0)

## 2018-09-26 LAB — BASIC METABOLIC PANEL
Anion gap: 9 (ref 5–15)
BUN: 17 mg/dL (ref 8–23)
CO2: 26 mmol/L (ref 22–32)
Calcium: 8.8 mg/dL — ABNORMAL LOW (ref 8.9–10.3)
Chloride: 105 mmol/L (ref 98–111)
Creatinine, Ser: 0.94 mg/dL (ref 0.44–1.00)
GFR calc Af Amer: >60 mL/min (ref >60)
GFR calc non Af Amer: 57 mL/min — ABNORMAL LOW (ref >60)
Glucose, Bld: 103 mg/dL — ABNORMAL HIGH (ref 70–99)
Potassium: 4.2 mmol/L (ref 3.5–5.1)
Sodium: 140 mmol/L (ref 135–145)

## 2018-09-26 MED ORDER — RIVAROXABAN 15 MG PO TABS
15.0000 mg | ORAL_TABLET | Freq: Two times a day (BID) | ORAL | Status: DC
  Administered 2018-09-26: 15 mg via ORAL
  Filled 2018-09-26: qty 1

## 2018-09-26 MED ORDER — SODIUM CHLORIDE 0.9 % IV BOLUS (SEPSIS)
500.0000 mL | Freq: Once | INTRAVENOUS | Status: AC
  Administered 2018-09-26: 500 mL via INTRAVENOUS

## 2018-09-26 MED ORDER — RIVAROXABAN (XARELTO) EDUCATION KIT FOR DVT/PE PATIENTS
PACK | Freq: Once | Status: AC
  Administered 2018-09-26: 11:00:00
  Filled 2018-09-26: qty 1

## 2018-09-26 MED ORDER — ONDANSETRON 4 MG PO TBDP
4.0000 mg | ORAL_TABLET | Freq: Once | ORAL | Status: AC
  Administered 2018-09-26: 4 mg via ORAL
  Filled 2018-09-26: qty 1

## 2018-09-26 MED ORDER — HYDROCODONE-ACETAMINOPHEN 5-325 MG PO TABS
1.0000 | ORAL_TABLET | Freq: Once | ORAL | Status: AC
  Administered 2018-09-26: 1 via ORAL
  Filled 2018-09-26: qty 1

## 2018-09-26 MED ORDER — RIVAROXABAN (XARELTO) VTE STARTER PACK (15 & 20 MG): ORAL_TABLET | ORAL | 0 refills | Status: DC

## 2018-09-26 MED ORDER — LOSARTAN POTASSIUM 50 MG PO TABS
50.0000 mg | ORAL_TABLET | Freq: Once | ORAL | Status: AC
  Administered 2018-09-26: 50 mg via ORAL
  Filled 2018-09-26: qty 1

## 2018-09-26 NOTE — Progress Notes (Signed)
ANTICOAGULATION CONSULT NOTE - Initial Consult  Pharmacy Consult for xarelto Indication: DVT  Allergies  Allergen Reactions  . Crestor [Rosuvastatin]     MYALGIAS  . Eggs Or Egg-Derived Products Itching, Nausea Only and Swelling  . Lemon Juice Itching, Nausea Only and Swelling  . Milk-Related Compounds Itching, Nausea Only and Swelling  . Repatha [Evolocumab]     Leg swelling and aches  . Shellfish Allergy Itching, Nausea Only and Swelling    Patient Measurements:     Vital Signs: Temp: 97.7 F (36.5 C) (09/21 0227) Temp Source: Oral (09/21 0227) BP: 179/81 (09/21 0830) Pulse Rate: 54 (09/21 0830)  Labs: Recent Labs    09/26/18 0341  HGB 12.9  HCT 40.4  PLT 204  CREATININE 0.94    Estimated Creatinine Clearance: 52.6 mL/min (by C-G formula based on SCr of 0.94 mg/dL).   Medical History: Past Medical History:  Diagnosis Date  . Cardiac angina ([place])   . Glaucoma    GLAUCOMA IN   BOTH EYES:  LEGALLY BLIND IN RIGHT EYE  . Goiter    THROID GOITER WITH COMPRESSION--PT HAVING TROUBLE SWALLOWING AND SOB  . Headache(784.0)    LEFT TEMPORAL AREA  . Heart attack ([place])    30 YRS AGO  DR VARANASI  EAGLE CARD  . Hyperlipidemia   . Hypertension   . Lipoma   . Pain    RIGHT THIGH FOR QUITE SOME TIME--LEG GIVES AWAY SOMETIMES.  LOWER BACK PAIN  - S/P HX OF PREVIOUS BACK SURGERY  . Peripheral vascular disease ([place])    09/2008 STENT LEFT EXTERNAL ILIAC;  03/20/10 RE-STENT OF LEFT EXTERNAL ILIAC STENT- AT [facility] WITH DR. VARANASI  . Recurrent upper respiratory infection (URI) MAY 2013   RECENT COLD   . Shortness of breath    WITH EXERTION   . Stroke [facility])    MINI STROKE  YRS AGO     Assessment:  84 year old female with history of hypertension, hyperlipidemia, CAD, peripheral vascular disease, stroke who presents to the emergency department complaints of left calf pain for the past couple of days.  Pharmacy consulted to dose xarelto for DVT.  Goal of Therapy:  xarelto  per indication   Plan:  -xarelto 15mg  po twice daily for 21 days then take 20mg  once daily with evening meal thereafter - f/u Scr - f/u s/s of bleed - pharmacy to provide education  Dolly Rias RPh 09/26/2018, 10:05 AM Pager [phone]

## 2018-09-26 NOTE — ED Notes (Signed)
US at bedside

## 2018-09-26 NOTE — ED Notes (Signed)
This nurse in pt room, POC discussed, visitor at bedside. Pt voicng no complaints at this time. Will continue to monitor.

## 2018-09-26 NOTE — ED Notes (Signed)
Pt d/c home per MD order. Discharge summary reviewed with pt, pt verbalizes understanding. Off unit via WC. Discharged home with family.

## 2018-09-26 NOTE — ED Provider Notes (Signed)
TIME SEEN: 2:06 AM  CHIEF COMPLAINT: Left calf pain  HPI: Patient is an 84 year old female with history of hypertension, hyperlipidemia, CAD, peripheral vascular disease, stroke who presents to the emergency department complaints of left calf pain for the past couple of days.  Noticed that this area seems to be more swollen than the right leg.  No history of previous PE or DVT.  Was recently admitted to the hospital for a fall but states that she was given Lovenox while in the hospital.  Denies chest pain or shortness of breath.  She states when she fell she does not think she injured this left leg but is not sure.  She normally ambulates with a walker and has been using a wheelchair recently secondary to dizziness.  Daughter states that this lightheadedness comes on intermittently and they think is associated with the medications that she takes.  She has follow-up with her doctor today.  Patient denies fevers, cough, vomiting, diarrhea.  No vertigo.  No numbness or focal weakness.  ROS: See HPI Constitutional: no fever  Eyes: no drainage  ENT: no runny nose   Cardiovascular:  no chest pain  Resp: no SOB  GI: no vomiting GU: no dysuria Integumentary: no rash  Allergy: no hives  Musculoskeletal: no leg swelling  Neurological: no slurred speech ROS otherwise negative  PAST MEDICAL HISTORY/PAST SURGICAL HISTORY:  Past Medical History:  Diagnosis Date  . Cardiac angina ([place])   . Glaucoma    GLAUCOMA IN   BOTH EYES:  LEGALLY BLIND IN RIGHT EYE  . Goiter    THROID GOITER WITH COMPRESSION--PT HAVING TROUBLE SWALLOWING AND SOB  . Headache(784.0)    LEFT TEMPORAL AREA  . Heart attack ([place])    30 YRS AGO  DR VARANASI  EAGLE CARD  . Hyperlipidemia   . Hypertension   . Lipoma   . Pain    RIGHT THIGH FOR QUITE SOME TIME--LEG GIVES AWAY SOMETIMES.  LOWER BACK PAIN  - S/P HX OF PREVIOUS BACK SURGERY  . Peripheral vascular disease ([place])    09/2008 STENT LEFT EXTERNAL ILIAC;  03/20/10 RE-STENT OF  LEFT EXTERNAL ILIAC STENT- AT [facility] WITH DR. VARANASI  . Recurrent upper respiratory infection (URI) MAY 2013   RECENT COLD   . Shortness of breath    WITH EXERTION   . Stroke [facility])    MINI STROKE  YRS AGO     MEDICATIONS:  Prior to Admission medications   Medication Sig Start Date End Date Taking? Authorizing Provider  amLODipine (NORVASC) 10 MG tablet Take 1 tablet (10 mg total) by mouth daily. 09/20/18   Rai, Vernelle Emerald, MD  aspirin EC 81 MG tablet Take 81 mg by mouth every morning.     [provider]  BIOTIN PO Take 1 tablet by mouth daily.    [provider]  COMBIGAN 0.2-0.5 % ophthalmic solution Place 1 drop into the right eye 2 (two) times daily. 02/12/17   [provider]  dexamethasone (DECADRON) 2 MG tablet Take 1 tablet (2 mg total) by mouth 2 (two) times daily for 10 days. 09/20/18 09/30/18  Rai, Vernelle Emerald, MD  docusate sodium (COLACE) 100 MG capsule Take 1 capsule (100 mg total) by mouth 2 (two) times daily. Also available over the counter 09/20/18   Rai, Ripudeep K, MD  gabapentin (NEURONTIN) 300 MG capsule Take 1 capsule (300 mg total) by mouth 3 (three) times daily. 09/20/18   Mendel Corning, MD  HYDROcodone-acetaminophen (NORCO/VICODIN) [phone]  MG tablet Take 1 tablet by mouth every 6 (six) hours as needed for up to 7 days for severe pain. 09/20/18 09/27/18  Rai, Vernelle Emerald, MD  levothyroxine (SYNTHROID, LEVOTHROID) 88 MCG tablet Take 88 mcg by mouth daily before breakfast.    [provider]  losartan (COZAAR) 50 MG tablet Take 1 tablet (50 mg total) by mouth daily. 09/21/18   Oretha Milch D, MD  LUMIGAN 0.01 % SOLN Place 1 drop into the right eye at bedtime. 02/12/17   [provider]  Multiple Vitamins-Minerals (CENTRUM SILVER 50+WOMEN) TABS Take 1 tablet by mouth daily.    [provider]  Netarsudil Dimesylate (RHOPRESSA OP) Place 1 drop into the right eye at bedtime.    [provider]  nitroGLYCERIN (NITROSTAT) 0.4  MG SL tablet Place 1 tablet (0.4 mg total) under the tongue every 5 (five) minutes as needed. For chest pain. 04/21/18   Jettie Booze, MD  ondansetron (ZOFRAN ODT) 4 MG disintegrating tablet Take 1 tablet (4 mg total) by mouth every 8 (eight) hours as needed for nausea or vomiting. 09/20/18   Rai, Ripudeep K, MD  pantoprazole (PROTONIX) 40 MG tablet Take 1 tablet (40 mg total) by mouth daily. While taking decadron 09/20/18   Rai, Ripudeep K, MD  polyethylene glycol (MIRALAX / GLYCOLAX) 17 g packet Take 17 g by mouth daily as needed for moderate constipation. Also available OTC 09/20/18   Rai, Ripudeep K, MD  Propylene Glycol (SYSTANE BALANCE) 0.6 % SOLN Apply 1 drop to eye 2 (two) times daily as needed (dry eyes).    [provider]  vitamin B-12 (CYANOCOBALAMIN) 100 MCG tablet Take 100 mcg by mouth daily.    [provider]    ALLERGIES:  Allergies  Allergen Reactions  . Crestor [Rosuvastatin]     MYALGIAS  . Eggs Or Egg-Derived Products Itching, Nausea Only and Swelling  . Lemon Juice Itching, Nausea Only and Swelling  . Milk-Related Compounds Itching, Nausea Only and Swelling  . Repatha [Evolocumab]     Leg swelling and aches  . Shellfish Allergy Itching, Nausea Only and Swelling    SOCIAL HISTORY:  Social History   Tobacco Use  . Smoking status: Never Smoker  . Smokeless tobacco: Never Used  Substance Use Topics  . Alcohol use: No    FAMILY HISTORY: Family History  Problem Relation Age of Onset  . Cancer Mother        brain  . Stroke Mother   . Heart disease Father   . Heart attack Father   . Hypertension Father   . Cancer Brother        prostate  . Anesthesia problems Neg Hx   . Hypotension Neg Hx   . Malignant hyperthermia Neg Hx   . Pseudochol deficiency Neg Hx   . Breast cancer Neg Hx     EXAM: BP 117/70 (BP Location: Left Arm)   Pulse (!) 58   Resp 16   SpO2 100%  CONSTITUTIONAL: Alert and oriented and responds appropriately to  questions. Well-appearing; well-nourished HEAD: Normocephalic, patient has old appearing bruising around the left eye EYES: Conjunctivae clear, pupils appear equal, EOMI ENT: normal nose; moist mucous membranes NECK: Supple, no meningismus, no nuchal rigidity, no LAD  CARD: RRR; S1 and S2 appreciated; no murmurs, no clicks, no rubs, no gallops RESP: Normal chest excursion without splinting or tachypnea; breath sounds clear and equal bilaterally; no wheezes, no rhonchi, no rales, no hypoxia or respiratory distress,  speaking full sentences ABD/GI: Normal bowel sounds; non-distended; soft, non-tender, no rebound, no guarding, no peritoneal signs, no hepatosplenomegaly BACK:  The back appears normal and is non-tender to palpation, there is no CVA tenderness EXT: Patient is tender to palpation behind the left knee and throughout the posterior left calf.  This leg does appear to be larger than the right side but there is no pitting edema.  2+ DP pulses bilaterally.  Compartments are soft.  No redness or warmth noted to the lower extremities.  No joint effusions.  No bony abnormality noted. SKIN: Normal color for age and race; warm; no rash NEURO: Moves all extremities equally, normal sensation diffusely, normal speech, no facial asymmetry, cranial nerves II through XII intact. PSYCH: The patient's mood and manner are appropriate. Grooming and personal hygiene are appropriate.  MEDICAL DECISION MAKING: Patient here with left leg swelling.  I am concerned that this could be a DVT.  Given her recent fall, will obtain x-rays to rule out bony abnormality.  Will give Vicodin for pain.  Daughter reports she has a prescription for this medication at home and this has controlled her pain previously.  Daughter is also concerned about this intermittent lightheadedness.  They think it could be related to her medications.  She has no focal neurologic deficits today.  She denies vertiginous symptoms.  Will give IV fluids,  check basic labs to ensure no anemia, electrolyte derangement, dehydration or UTI that is causing her symptoms of dizziness.  They have follow-up scheduled already today with their primary care doctor.  ED PROGRESS: Patient reports feeling better after IV fluids and Vicodin.  Her labs, urine are unremarkable.  Her x-rays of the left lower extremity show no bony abnormality.  I have offered to have patient come back at 8 AM for venous Doppler but given it is already after 5 AM patient and daughter would like to stay in the ED to have venous Doppler before going home.  They are aware that if a DVT is present, we will start oral anticoagulation and have her follow-up with her PCP today.  If her Doppler is negative, she will be discharged to follow-up with her PCP without further emergent work-up.  She has Vicodin at home already for pain control.  Discussed this at length with patient and daughter.  They are comfortable with this plan.  7:00 AM  Signed out to Dr. Tomi Bamberger to follow-up on patient's venous Doppler results.   I reviewed all nursing notes, vitals, pertinent previous records, EKGs, lab and urine results, imaging (as available).      EKG Interpretation  Date/Time:  Monday September 26 2018 02:33:24 EDT Ventricular Rate:  51 PR Interval:    QRS Duration: 104 QT Interval:  490 QTC Calculation: 452 R Axis:     Text Interpretation:  Sinus rhythm Minimal ST depression, lateral leads No significant change since last tracing Confirmed by Jaquann Guarisco, Cyril Mourning [phone]) on 09/26/2018 2:35:33 AM         Sumeya Yontz, Delice Bison, DO 09/26/18 0745

## 2018-09-26 NOTE — ED Provider Notes (Signed)
Pt initially seen by Dr Leonides Schanz.  Please see her note.  Plan was to follow-up on her Doppler study.  Preliminary report is that patient has a positive popliteal DVT.  Patient has normal renal function.  Plan on discharge with prescription for Xarelto.  Findings and plan discussed with pt and daughter     [name], MD 09/26/18 0930

## 2018-09-26 NOTE — Discharge Instructions (Addendum)
Please follow-up with your primary care physician as scheduled today.  Your labs, urine, EKG today were normal.  X-rays of your left leg showed no fractures or dislocation.  Take the xarelto for your DVT as prescribed.  Stop your aspirin  Please take your hydrocodone as prescribed for pain control.  Information on my medicine - XARELTO (rivaroxaban)   WHY WAS XARELTO PRESCRIBED FOR YOU? Xarelto was prescribed to treat blood clots that may have been found in the veins of your legs (deep vein thrombosis) or in your lungs (pulmonary embolism) and to reduce the risk of them occurring again.  What do you need to know about Xarelto? The starting dose is one 15 mg tablet taken TWICE daily with food for the FIRST 21 DAYS then on (enter date)  10/17/2018  the dose is changed to one 20 mg tablet taken ONCE A DAY with your evening meal.  DO NOT stop taking Xarelto without talking to the health care provider who prescribed the medication.  Refill your prescription for 20 mg tablets before you run out.  After discharge, you should have regular check-up appointments with your healthcare provider that is prescribing your Xarelto.  In the future your dose may need to be changed if your kidney function changes by a significant amount.  What do you do if you miss a dose? If you are taking Xarelto TWICE DAILY and you miss a dose, take it as soon as you remember. You may take two 15 mg tablets (total 30 mg) at the same time then resume your regularly scheduled 15 mg twice daily the next day.  If you are taking Xarelto ONCE DAILY and you miss a dose, take it as soon as you remember on the same day then continue your regularly scheduled once daily regimen the next day. Do not take two doses of Xarelto at the same time.   Important Safety Information Xarelto is a blood thinner medicine that can cause bleeding. You should call your healthcare provider right away if you experience any of the  following: ? Bleeding from an injury or your nose that does not stop. ? Unusual colored urine (red or dark brown) or unusual colored stools (red or black). ? Unusual bruising for unknown reasons. ? A serious fall or if you hit your head (even if there is no bleeding).  Some medicines may interact with Xarelto and might increase your risk of bleeding while on Xarelto. To help avoid this, consult your healthcare provider or pharmacist prior to using any new prescription or non-prescription medications, including herbals, vitamins, non-steroidal anti-inflammatory drugs (NSAIDs) and supplements.  This website has more information on Xarelto: [email or web address]

## 2018-09-26 NOTE — Progress Notes (Signed)
Left lower extremity venous duplex completed. Preliminary results in Chart results CV Proc. [place] Polina Burmaster,RVS 09/26/2018, 9:24 AM

## 2018-09-26 NOTE — ED Triage Notes (Signed)
Pt reports L calf pain x 2 days. Slight swelling. Reports that pain has worsened. She fell and was admitted last week. Denies blood thinner use. She also states that she was nauseous earlier today.

## 2018-10-03 DIAGNOSIS — I82462 Acute embolism and thrombosis of left calf muscular vein: Secondary | ICD-10-CM | POA: Diagnosis not present

## 2018-10-03 DIAGNOSIS — H05232 Hemorrhage of left orbit: Secondary | ICD-10-CM | POA: Diagnosis not present

## 2018-10-03 DIAGNOSIS — I10 Essential (primary) hypertension: Secondary | ICD-10-CM | POA: Diagnosis not present

## 2018-10-03 DIAGNOSIS — R296 Repeated falls: Secondary | ICD-10-CM | POA: Diagnosis not present

## 2018-10-07 DIAGNOSIS — R202 Paresthesia of skin: Secondary | ICD-10-CM | POA: Diagnosis not present

## 2018-10-07 DIAGNOSIS — Z86018 Personal history of other benign neoplasm: Secondary | ICD-10-CM | POA: Diagnosis not present

## 2018-10-07 DIAGNOSIS — I739 Peripheral vascular disease, unspecified: Secondary | ICD-10-CM | POA: Diagnosis not present

## 2018-10-07 DIAGNOSIS — Z79891 Long term (current) use of opiate analgesic: Secondary | ICD-10-CM | POA: Diagnosis not present

## 2018-10-07 DIAGNOSIS — M2578 Osteophyte, vertebrae: Secondary | ICD-10-CM | POA: Diagnosis not present

## 2018-10-07 DIAGNOSIS — M5134 Other intervertebral disc degeneration, thoracic region: Secondary | ICD-10-CM | POA: Diagnosis not present

## 2018-10-07 DIAGNOSIS — E890 Postprocedural hypothyroidism: Secondary | ICD-10-CM | POA: Diagnosis not present

## 2018-10-07 DIAGNOSIS — M4802 Spinal stenosis, cervical region: Secondary | ICD-10-CM | POA: Diagnosis not present

## 2018-10-07 DIAGNOSIS — I10 Essential (primary) hypertension: Secondary | ICD-10-CM | POA: Diagnosis not present

## 2018-10-07 DIAGNOSIS — Z9861 Coronary angioplasty status: Secondary | ICD-10-CM | POA: Diagnosis not present

## 2018-10-07 DIAGNOSIS — D734 Cyst of spleen: Secondary | ICD-10-CM | POA: Diagnosis not present

## 2018-10-07 DIAGNOSIS — H548 Legal blindness, as defined in USA: Secondary | ICD-10-CM | POA: Diagnosis not present

## 2018-10-07 DIAGNOSIS — S42125D Nondisplaced fracture of acromial process, left shoulder, subsequent encounter for fracture with routine healing: Secondary | ICD-10-CM | POA: Diagnosis not present

## 2018-10-07 DIAGNOSIS — Z7982 Long term (current) use of aspirin: Secondary | ICD-10-CM | POA: Diagnosis not present

## 2018-10-07 DIAGNOSIS — Z8709 Personal history of other diseases of the respiratory system: Secondary | ICD-10-CM | POA: Diagnosis not present

## 2018-10-07 DIAGNOSIS — H409 Unspecified glaucoma: Secondary | ICD-10-CM | POA: Diagnosis not present

## 2018-10-07 DIAGNOSIS — K449 Diaphragmatic hernia without obstruction or gangrene: Secondary | ICD-10-CM | POA: Diagnosis not present

## 2018-10-07 DIAGNOSIS — E782 Mixed hyperlipidemia: Secondary | ICD-10-CM | POA: Diagnosis not present

## 2018-10-07 DIAGNOSIS — E876 Hypokalemia: Secondary | ICD-10-CM | POA: Diagnosis not present

## 2018-10-07 DIAGNOSIS — Z95820 Peripheral vascular angioplasty status with implants and grafts: Secondary | ICD-10-CM | POA: Diagnosis not present

## 2018-10-07 DIAGNOSIS — I25119 Atherosclerotic heart disease of native coronary artery with unspecified angina pectoris: Secondary | ICD-10-CM | POA: Diagnosis not present

## 2018-10-07 DIAGNOSIS — S0003XD Contusion of scalp, subsequent encounter: Secondary | ICD-10-CM | POA: Diagnosis not present

## 2018-10-07 DIAGNOSIS — M79605 Pain in left leg: Secondary | ICD-10-CM | POA: Diagnosis not present

## 2018-10-07 DIAGNOSIS — S0083XD Contusion of other part of head, subsequent encounter: Secondary | ICD-10-CM | POA: Diagnosis not present

## 2018-10-07 DIAGNOSIS — M47813 Spondylosis without myelopathy or radiculopathy, cervicothoracic region: Secondary | ICD-10-CM | POA: Diagnosis not present

## 2018-10-23 DIAGNOSIS — S14125A Central cord syndrome at C5 level of cervical spinal cord, initial encounter: Secondary | ICD-10-CM | POA: Diagnosis not present

## 2018-10-24 DIAGNOSIS — M542 Cervicalgia: Secondary | ICD-10-CM | POA: Diagnosis not present

## 2018-10-24 DIAGNOSIS — M4802 Spinal stenosis, cervical region: Secondary | ICD-10-CM | POA: Diagnosis not present

## 2018-10-24 DIAGNOSIS — S14125S Central cord syndrome at C5 level of cervical spinal cord, sequela: Secondary | ICD-10-CM | POA: Diagnosis not present

## 2018-10-24 DIAGNOSIS — M5412 Radiculopathy, cervical region: Secondary | ICD-10-CM | POA: Diagnosis not present

## 2018-10-24 DIAGNOSIS — G959 Disease of spinal cord, unspecified: Secondary | ICD-10-CM | POA: Diagnosis not present

## 2018-10-26 DIAGNOSIS — Z79891 Long term (current) use of opiate analgesic: Secondary | ICD-10-CM | POA: Diagnosis not present

## 2018-10-26 DIAGNOSIS — M47813 Spondylosis without myelopathy or radiculopathy, cervicothoracic region: Secondary | ICD-10-CM | POA: Diagnosis not present

## 2018-10-26 DIAGNOSIS — E890 Postprocedural hypothyroidism: Secondary | ICD-10-CM | POA: Diagnosis not present

## 2018-10-26 DIAGNOSIS — I25119 Atherosclerotic heart disease of native coronary artery with unspecified angina pectoris: Secondary | ICD-10-CM | POA: Diagnosis not present

## 2018-10-26 DIAGNOSIS — M79605 Pain in left leg: Secondary | ICD-10-CM | POA: Diagnosis not present

## 2018-10-26 DIAGNOSIS — S0003XD Contusion of scalp, subsequent encounter: Secondary | ICD-10-CM | POA: Diagnosis not present

## 2018-10-26 DIAGNOSIS — Z8709 Personal history of other diseases of the respiratory system: Secondary | ICD-10-CM | POA: Diagnosis not present

## 2018-10-26 DIAGNOSIS — Z9861 Coronary angioplasty status: Secondary | ICD-10-CM | POA: Diagnosis not present

## 2018-10-26 DIAGNOSIS — E782 Mixed hyperlipidemia: Secondary | ICD-10-CM | POA: Diagnosis not present

## 2018-10-26 DIAGNOSIS — M5134 Other intervertebral disc degeneration, thoracic region: Secondary | ICD-10-CM | POA: Diagnosis not present

## 2018-10-26 DIAGNOSIS — R202 Paresthesia of skin: Secondary | ICD-10-CM | POA: Diagnosis not present

## 2018-10-26 DIAGNOSIS — H548 Legal blindness, as defined in USA: Secondary | ICD-10-CM | POA: Diagnosis not present

## 2018-10-26 DIAGNOSIS — E876 Hypokalemia: Secondary | ICD-10-CM | POA: Diagnosis not present

## 2018-10-26 DIAGNOSIS — M4802 Spinal stenosis, cervical region: Secondary | ICD-10-CM | POA: Diagnosis not present

## 2018-10-26 DIAGNOSIS — Z7982 Long term (current) use of aspirin: Secondary | ICD-10-CM | POA: Diagnosis not present

## 2018-10-26 DIAGNOSIS — Z86018 Personal history of other benign neoplasm: Secondary | ICD-10-CM | POA: Diagnosis not present

## 2018-10-26 DIAGNOSIS — Z95820 Peripheral vascular angioplasty status with implants and grafts: Secondary | ICD-10-CM | POA: Diagnosis not present

## 2018-10-26 DIAGNOSIS — K449 Diaphragmatic hernia without obstruction or gangrene: Secondary | ICD-10-CM | POA: Diagnosis not present

## 2018-10-26 DIAGNOSIS — S0083XD Contusion of other part of head, subsequent encounter: Secondary | ICD-10-CM | POA: Diagnosis not present

## 2018-10-26 DIAGNOSIS — I739 Peripheral vascular disease, unspecified: Secondary | ICD-10-CM | POA: Diagnosis not present

## 2018-10-26 DIAGNOSIS — I10 Essential (primary) hypertension: Secondary | ICD-10-CM | POA: Diagnosis not present

## 2018-10-26 DIAGNOSIS — H409 Unspecified glaucoma: Secondary | ICD-10-CM | POA: Diagnosis not present

## 2018-10-26 DIAGNOSIS — S42125D Nondisplaced fracture of acromial process, left shoulder, subsequent encounter for fracture with routine healing: Secondary | ICD-10-CM | POA: Diagnosis not present

## 2018-10-26 DIAGNOSIS — M2578 Osteophyte, vertebrae: Secondary | ICD-10-CM | POA: Diagnosis not present

## 2018-10-26 DIAGNOSIS — D734 Cyst of spleen: Secondary | ICD-10-CM | POA: Diagnosis not present

## 2018-11-08 DIAGNOSIS — E876 Hypokalemia: Secondary | ICD-10-CM | POA: Diagnosis not present

## 2018-11-08 DIAGNOSIS — R202 Paresthesia of skin: Secondary | ICD-10-CM | POA: Diagnosis not present

## 2018-11-08 DIAGNOSIS — I25119 Atherosclerotic heart disease of native coronary artery with unspecified angina pectoris: Secondary | ICD-10-CM | POA: Diagnosis not present

## 2018-11-08 DIAGNOSIS — I739 Peripheral vascular disease, unspecified: Secondary | ICD-10-CM | POA: Diagnosis not present

## 2018-11-08 DIAGNOSIS — M79605 Pain in left leg: Secondary | ICD-10-CM | POA: Diagnosis not present

## 2018-11-08 DIAGNOSIS — E782 Mixed hyperlipidemia: Secondary | ICD-10-CM | POA: Diagnosis not present

## 2018-11-08 DIAGNOSIS — H548 Legal blindness, as defined in USA: Secondary | ICD-10-CM | POA: Diagnosis not present

## 2018-11-08 DIAGNOSIS — K449 Diaphragmatic hernia without obstruction or gangrene: Secondary | ICD-10-CM | POA: Diagnosis not present

## 2018-11-08 DIAGNOSIS — Z9861 Coronary angioplasty status: Secondary | ICD-10-CM | POA: Diagnosis not present

## 2018-11-08 DIAGNOSIS — S42125D Nondisplaced fracture of acromial process, left shoulder, subsequent encounter for fracture with routine healing: Secondary | ICD-10-CM | POA: Diagnosis not present

## 2018-11-08 DIAGNOSIS — Z8709 Personal history of other diseases of the respiratory system: Secondary | ICD-10-CM | POA: Diagnosis not present

## 2018-11-08 DIAGNOSIS — M4802 Spinal stenosis, cervical region: Secondary | ICD-10-CM | POA: Diagnosis not present

## 2018-11-08 DIAGNOSIS — Z95820 Peripheral vascular angioplasty status with implants and grafts: Secondary | ICD-10-CM | POA: Diagnosis not present

## 2018-11-08 DIAGNOSIS — S0003XD Contusion of scalp, subsequent encounter: Secondary | ICD-10-CM | POA: Diagnosis not present

## 2018-11-08 DIAGNOSIS — I10 Essential (primary) hypertension: Secondary | ICD-10-CM | POA: Diagnosis not present

## 2018-11-08 DIAGNOSIS — E890 Postprocedural hypothyroidism: Secondary | ICD-10-CM | POA: Diagnosis not present

## 2018-11-08 DIAGNOSIS — H409 Unspecified glaucoma: Secondary | ICD-10-CM | POA: Diagnosis not present

## 2018-11-08 DIAGNOSIS — Z86018 Personal history of other benign neoplasm: Secondary | ICD-10-CM | POA: Diagnosis not present

## 2018-11-08 DIAGNOSIS — Z7982 Long term (current) use of aspirin: Secondary | ICD-10-CM | POA: Diagnosis not present

## 2018-11-08 DIAGNOSIS — D734 Cyst of spleen: Secondary | ICD-10-CM | POA: Diagnosis not present

## 2018-11-08 DIAGNOSIS — M5134 Other intervertebral disc degeneration, thoracic region: Secondary | ICD-10-CM | POA: Diagnosis not present

## 2018-11-08 DIAGNOSIS — M2578 Osteophyte, vertebrae: Secondary | ICD-10-CM | POA: Diagnosis not present

## 2018-11-08 DIAGNOSIS — Z79891 Long term (current) use of opiate analgesic: Secondary | ICD-10-CM | POA: Diagnosis not present

## 2018-11-08 DIAGNOSIS — S0083XD Contusion of other part of head, subsequent encounter: Secondary | ICD-10-CM | POA: Diagnosis not present

## 2018-11-08 DIAGNOSIS — M47813 Spondylosis without myelopathy or radiculopathy, cervicothoracic region: Secondary | ICD-10-CM | POA: Diagnosis not present

## 2018-11-09 DIAGNOSIS — G5793 Unspecified mononeuropathy of bilateral lower limbs: Secondary | ICD-10-CM | POA: Diagnosis not present

## 2018-11-09 DIAGNOSIS — I739 Peripheral vascular disease, unspecified: Secondary | ICD-10-CM | POA: Diagnosis not present

## 2018-11-09 DIAGNOSIS — R296 Repeated falls: Secondary | ICD-10-CM | POA: Diagnosis not present

## 2018-11-09 DIAGNOSIS — I10 Essential (primary) hypertension: Secondary | ICD-10-CM | POA: Diagnosis not present

## 2018-11-09 DIAGNOSIS — E785 Hyperlipidemia, unspecified: Secondary | ICD-10-CM | POA: Diagnosis not present

## 2018-11-09 DIAGNOSIS — I82462 Acute embolism and thrombosis of left calf muscular vein: Secondary | ICD-10-CM | POA: Diagnosis not present

## 2018-11-09 DIAGNOSIS — H409 Unspecified glaucoma: Secondary | ICD-10-CM | POA: Diagnosis not present

## 2018-11-09 DIAGNOSIS — E039 Hypothyroidism, unspecified: Secondary | ICD-10-CM | POA: Diagnosis not present

## 2018-11-09 DIAGNOSIS — E559 Vitamin D deficiency, unspecified: Secondary | ICD-10-CM | POA: Diagnosis not present

## 2018-11-09 DIAGNOSIS — I2510 Atherosclerotic heart disease of native coronary artery without angina pectoris: Secondary | ICD-10-CM | POA: Diagnosis not present

## 2018-11-09 DIAGNOSIS — H05232 Hemorrhage of left orbit: Secondary | ICD-10-CM | POA: Diagnosis not present

## 2018-11-11 DIAGNOSIS — N3000 Acute cystitis without hematuria: Secondary | ICD-10-CM | POA: Diagnosis not present

## 2018-11-16 DIAGNOSIS — H409 Unspecified glaucoma: Secondary | ICD-10-CM | POA: Diagnosis not present

## 2018-11-16 DIAGNOSIS — G5793 Unspecified mononeuropathy of bilateral lower limbs: Secondary | ICD-10-CM | POA: Diagnosis not present

## 2018-11-16 DIAGNOSIS — H547 Unspecified visual loss: Secondary | ICD-10-CM | POA: Diagnosis not present

## 2018-11-16 DIAGNOSIS — E039 Hypothyroidism, unspecified: Secondary | ICD-10-CM | POA: Diagnosis not present

## 2018-11-16 DIAGNOSIS — E559 Vitamin D deficiency, unspecified: Secondary | ICD-10-CM | POA: Diagnosis not present

## 2018-11-16 DIAGNOSIS — I201 Angina pectoris with documented spasm: Secondary | ICD-10-CM | POA: Diagnosis not present

## 2018-11-16 DIAGNOSIS — I25119 Atherosclerotic heart disease of native coronary artery with unspecified angina pectoris: Secondary | ICD-10-CM | POA: Diagnosis not present

## 2018-11-16 DIAGNOSIS — R296 Repeated falls: Secondary | ICD-10-CM | POA: Diagnosis not present

## 2018-11-16 DIAGNOSIS — N390 Urinary tract infection, site not specified: Secondary | ICD-10-CM | POA: Diagnosis not present

## 2018-11-16 DIAGNOSIS — I10 Essential (primary) hypertension: Secondary | ICD-10-CM | POA: Diagnosis not present

## 2018-11-16 DIAGNOSIS — E785 Hyperlipidemia, unspecified: Secondary | ICD-10-CM | POA: Diagnosis not present

## 2018-11-16 DIAGNOSIS — I82462 Acute embolism and thrombosis of left calf muscular vein: Secondary | ICD-10-CM | POA: Diagnosis not present

## 2018-11-17 DIAGNOSIS — I25119 Atherosclerotic heart disease of native coronary artery with unspecified angina pectoris: Secondary | ICD-10-CM | POA: Diagnosis not present

## 2018-11-17 DIAGNOSIS — E785 Hyperlipidemia, unspecified: Secondary | ICD-10-CM | POA: Diagnosis not present

## 2018-11-17 DIAGNOSIS — H409 Unspecified glaucoma: Secondary | ICD-10-CM | POA: Diagnosis not present

## 2018-11-17 DIAGNOSIS — I10 Essential (primary) hypertension: Secondary | ICD-10-CM | POA: Diagnosis not present

## 2018-11-17 DIAGNOSIS — E039 Hypothyroidism, unspecified: Secondary | ICD-10-CM | POA: Diagnosis not present

## 2018-11-17 DIAGNOSIS — I201 Angina pectoris with documented spasm: Secondary | ICD-10-CM | POA: Diagnosis not present

## 2018-11-23 DIAGNOSIS — G959 Disease of spinal cord, unspecified: Secondary | ICD-10-CM | POA: Diagnosis not present

## 2018-11-23 DIAGNOSIS — M48062 Spinal stenosis, lumbar region with neurogenic claudication: Secondary | ICD-10-CM | POA: Diagnosis not present

## 2018-11-23 DIAGNOSIS — H04123 Dry eye syndrome of bilateral lacrimal glands: Secondary | ICD-10-CM | POA: Diagnosis not present

## 2018-11-23 DIAGNOSIS — S14125S Central cord syndrome at C5 level of cervical spinal cord, sequela: Secondary | ICD-10-CM | POA: Diagnosis not present

## 2018-11-23 DIAGNOSIS — H401122 Primary open-angle glaucoma, left eye, moderate stage: Secondary | ICD-10-CM | POA: Diagnosis not present

## 2018-11-23 DIAGNOSIS — M4802 Spinal stenosis, cervical region: Secondary | ICD-10-CM | POA: Diagnosis not present

## 2018-11-23 DIAGNOSIS — M542 Cervicalgia: Secondary | ICD-10-CM | POA: Diagnosis not present

## 2018-11-23 DIAGNOSIS — H401113 Primary open-angle glaucoma, right eye, severe stage: Secondary | ICD-10-CM | POA: Diagnosis not present

## 2018-11-23 DIAGNOSIS — S14125A Central cord syndrome at C5 level of cervical spinal cord, initial encounter: Secondary | ICD-10-CM | POA: Diagnosis not present

## 2018-11-23 DIAGNOSIS — Z961 Presence of intraocular lens: Secondary | ICD-10-CM | POA: Diagnosis not present

## 2018-11-24 ENCOUNTER — Other Ambulatory Visit: Payer: Self-pay | Admitting: Neurosurgery

## 2018-11-24 ENCOUNTER — Other Ambulatory Visit (HOSPITAL_COMMUNITY): Payer: Self-pay | Admitting: Neurosurgery

## 2018-11-24 DIAGNOSIS — M5416 Radiculopathy, lumbar region: Secondary | ICD-10-CM

## 2018-12-16 ENCOUNTER — Other Ambulatory Visit: Payer: Self-pay

## 2018-12-16 ENCOUNTER — Ambulatory Visit (HOSPITAL_COMMUNITY)
Admission: RE | Admit: 2018-12-16 | Discharge: 2018-12-16 | Disposition: A | Discharge status: Home / Self Care | Payer: PPO | Source: Ambulatory Visit | Attending: Neurosurgery | Admitting: Neurosurgery

## 2018-12-16 DIAGNOSIS — M48061 Spinal stenosis, lumbar region without neurogenic claudication: Secondary | ICD-10-CM | POA: Diagnosis not present

## 2018-12-16 DIAGNOSIS — M5416 Radiculopathy, lumbar region: Secondary | ICD-10-CM

## 2018-12-16 LAB — CREATININE, SERUM
Creatinine, Ser: 0.97 mg/dL (ref 0.44–1.00)
GFR calc Af Amer: >60 mL/min (ref >60)
GFR calc non Af Amer: 54 mL/min — ABNORMAL LOW (ref >60)

## 2018-12-16 MED ORDER — GADOBUTROL 1 MMOL/ML IV SOLN
10.0000 mL | Freq: Once | INTRAVENOUS | Status: AC | PRN
  Administered 2018-12-16: 10 mL via INTRAVENOUS

## 2018-12-23 DIAGNOSIS — S14125A Central cord syndrome at C5 level of cervical spinal cord, initial encounter: Secondary | ICD-10-CM | POA: Diagnosis not present

## 2019-01-10 ENCOUNTER — Other Ambulatory Visit: Payer: Self-pay | Admitting: Family Medicine

## 2019-01-10 DIAGNOSIS — Z1231 Encounter for screening mammogram for malignant neoplasm of breast: Secondary | ICD-10-CM

## 2019-01-16 DIAGNOSIS — H547 Unspecified visual loss: Secondary | ICD-10-CM | POA: Diagnosis not present

## 2019-01-16 DIAGNOSIS — E785 Hyperlipidemia, unspecified: Secondary | ICD-10-CM | POA: Diagnosis not present

## 2019-01-16 DIAGNOSIS — R296 Repeated falls: Secondary | ICD-10-CM | POA: Diagnosis not present

## 2019-01-16 DIAGNOSIS — E559 Vitamin D deficiency, unspecified: Secondary | ICD-10-CM | POA: Diagnosis not present

## 2019-01-16 DIAGNOSIS — M542 Cervicalgia: Secondary | ICD-10-CM | POA: Diagnosis not present

## 2019-01-16 DIAGNOSIS — I739 Peripheral vascular disease, unspecified: Secondary | ICD-10-CM | POA: Diagnosis not present

## 2019-01-16 DIAGNOSIS — G5793 Unspecified mononeuropathy of bilateral lower limbs: Secondary | ICD-10-CM | POA: Diagnosis not present

## 2019-01-16 DIAGNOSIS — E039 Hypothyroidism, unspecified: Secondary | ICD-10-CM | POA: Diagnosis not present

## 2019-01-16 DIAGNOSIS — I10 Essential (primary) hypertension: Secondary | ICD-10-CM | POA: Diagnosis not present

## 2019-01-16 DIAGNOSIS — G039 Meningitis, unspecified: Secondary | ICD-10-CM | POA: Diagnosis not present

## 2019-01-16 DIAGNOSIS — M5416 Radiculopathy, lumbar region: Secondary | ICD-10-CM | POA: Diagnosis not present

## 2019-01-16 DIAGNOSIS — G959 Disease of spinal cord, unspecified: Secondary | ICD-10-CM | POA: Diagnosis not present

## 2019-01-16 DIAGNOSIS — H409 Unspecified glaucoma: Secondary | ICD-10-CM | POA: Diagnosis not present

## 2019-01-16 DIAGNOSIS — I82462 Acute embolism and thrombosis of left calf muscular vein: Secondary | ICD-10-CM | POA: Diagnosis not present

## 2019-01-23 DIAGNOSIS — S14125A Central cord syndrome at C5 level of cervical spinal cord, initial encounter: Secondary | ICD-10-CM | POA: Diagnosis not present

## 2019-01-24 NOTE — Patient Outreach (Signed)
Received a referral from Dr. Addison Lank from [place] at [facility].    Patient has [employer] insurance, This referral has been transferred to [employer] CM through email address [email or web address] per workflow.

## 2019-02-02 DIAGNOSIS — E785 Hyperlipidemia, unspecified: Secondary | ICD-10-CM | POA: Diagnosis not present

## 2019-02-02 DIAGNOSIS — E039 Hypothyroidism, unspecified: Secondary | ICD-10-CM | POA: Diagnosis not present

## 2019-02-02 DIAGNOSIS — I201 Angina pectoris with documented spasm: Secondary | ICD-10-CM | POA: Diagnosis not present

## 2019-02-02 DIAGNOSIS — H409 Unspecified glaucoma: Secondary | ICD-10-CM | POA: Diagnosis not present

## 2019-02-02 DIAGNOSIS — I25119 Atherosclerotic heart disease of native coronary artery with unspecified angina pectoris: Secondary | ICD-10-CM | POA: Diagnosis not present

## 2019-02-02 DIAGNOSIS — I10 Essential (primary) hypertension: Secondary | ICD-10-CM | POA: Diagnosis not present

## 2019-02-06 DIAGNOSIS — I10 Essential (primary) hypertension: Secondary | ICD-10-CM | POA: Diagnosis not present

## 2019-02-06 DIAGNOSIS — H409 Unspecified glaucoma: Secondary | ICD-10-CM | POA: Diagnosis not present

## 2019-02-06 DIAGNOSIS — I82432 Acute embolism and thrombosis of left popliteal vein: Secondary | ICD-10-CM | POA: Diagnosis not present

## 2019-02-06 DIAGNOSIS — G5793 Unspecified mononeuropathy of bilateral lower limbs: Secondary | ICD-10-CM | POA: Diagnosis not present

## 2019-02-06 DIAGNOSIS — Z7409 Other reduced mobility: Secondary | ICD-10-CM | POA: Diagnosis not present

## 2019-02-06 DIAGNOSIS — E785 Hyperlipidemia, unspecified: Secondary | ICD-10-CM | POA: Diagnosis not present

## 2019-02-06 DIAGNOSIS — E039 Hypothyroidism, unspecified: Secondary | ICD-10-CM | POA: Diagnosis not present

## 2019-02-06 DIAGNOSIS — L853 Xerosis cutis: Secondary | ICD-10-CM | POA: Diagnosis not present

## 2019-02-06 DIAGNOSIS — R002 Palpitations: Secondary | ICD-10-CM | POA: Diagnosis not present

## 2019-02-06 DIAGNOSIS — I25119 Atherosclerotic heart disease of native coronary artery with unspecified angina pectoris: Secondary | ICD-10-CM | POA: Diagnosis not present

## 2019-02-07 ENCOUNTER — Encounter: Payer: Self-pay | Admitting: Physician Assistant

## 2019-02-08 NOTE — Progress Notes (Signed)
Cardiology Office Note  Date: 02/10/2019   ID: Taneia, Basom [birth year], MRN [id number]  PCP:  Cari Caraway, MD  Cardiologist:  Larae Grooms, MD Electrophysiologist:  None   Chief Complaint  Patient presents with  . Follow-up    Dizziness, palpitations, hypertension.    History of Present Illness: STELLA VERDIN is a 85 y.o. female last encounter via telemedicine 4/16  2020 with Dr. Irish Lack   She has a history of PAD.  Previous left iliac stent placement. Hx of back surgery. Has issues with balance uses a cane or walker.  Previous cardiac cath and 2009 and 2012 showed no significant coronary artery disease.  Stress for CP normal 2019.  Mobility limited by back pain.    Past medical history includes; glaucoma,  thyroid goiter with compression with subsequent surgery.  History of hyperlipidemia, dyspnea with exertion, "mini stroke" years ago.  Previous back surgery in 1988 leaving her with some left-sided arm paralysis and left leg paralysis. Left side is weaker than her right.  Patient had recent fall out of her bed September 17 2018.  She presented to the emergency room with multiple complaints of pain in her neck, head,chest, and upper extremeties with contusion/hematoma to the head. She had multiple CT and xray examinations of head, neck, chest, abdomen, knee, tibula / fibula, ankle. There were no acute abnormalities. Neurosurgery  Dr. Vertell Limber was consulted. Assessment was cervical stenosis and cord compression at C5 - C6 level. He recommends surgery.   She had subsequent left calf pain and swelling and lower extremity venous study showed left popliteal DVT. She was placed on Xarelto and is following with Dr. Brien Mates.  She admits to dizziness upon standing and associated palpitations.She states this started after her fall in September.  Past Medical History:  Diagnosis Date  . Chest pain with normal coronary angiography 2009, 2012  . DVT (deep venous  thrombosis) ([place])    a. LLE 09/2018 following admission for fall.  . Gait instability   . Glaucoma    GLAUCOMA IN   BOTH EYES:  LEGALLY BLIND IN RIGHT EYE  . Goiter    THROID GOITER WITH COMPRESSION--PT HAVING TROUBLE SWALLOWING AND SOB  . Headache(784.0)    LEFT TEMPORAL AREA  . Hyperlipidemia   . Hypertension   . Lipoma   . Pain    RIGHT THIGH FOR QUITE SOME TIME--LEG GIVES AWAY SOMETIMES.  LOWER BACK PAIN  - S/P HX OF PREVIOUS BACK SURGERY  . Peripheral vascular disease ([place])    09/2008 STENT LEFT EXTERNAL ILIAC;  03/20/10 RE-STENT OF LEFT EXTERNAL ILIAC STENT- AT [facility] WITH DR. VARANASI  . Stroke [facility])    MINI STROKE  YRS AGO     Past Surgical History:  Procedure Laterality Date  . APPENDECTOMY    . BACK SURGERY  1988   PT STATES AFTER THE BACK SURGERY PT HAS PARALYSIS LEFT ARM AND LEG--TOOK 2&1/2 YEARS TO REGAIN USE OF LEFT SIDE--LEFT SIDE REMAINS WEAKER THAN HER RIFHT SIDE.  Marland Kitchen CARDIAC CATHETERIZATION  2012  . EYE SURGERY     left eye, lens replacement  . MASS EXCISION  01/13/2011   Procedure: EXCISION MASS;  Surgeon: Stark Klein, MD;  Location: [place];  Service: General;  Laterality: Left;  Left abdominal wall mass (subcutaneous, 4-5 cm).  . THYROIDECTOMY  08/14/2011   Procedure: THYROIDECTOMY;  Surgeon: Earnstine Regal, MD;  Location: [facility];  Service: General;  Laterality: N/A;  Total Thyroidectomy  .  VASCULAR SURGERY     STENT PLACED 03/2010 RT LEG    Current Outpatient Medications  Medication Sig Dispense Refill  . amLODipine (NORVASC) 10 MG tablet Take 1 tablet (10 mg total) by mouth daily. 30 tablet 3  . BIOTIN PO Take 1 tablet by mouth daily.    . COMBIGAN 0.2-0.5 % ophthalmic solution Place 1 drop into the right eye 2 (two) times daily.    Marland Kitchen docusate sodium (COLACE) 100 MG capsule Take 1 capsule (100 mg total) by mouth 2 (two) times daily. Also available over the counter 60 capsule 3  . gabapentin (NEURONTIN) 300 MG capsule Take 1 capsule (300 mg total) by mouth 3 (three)  times daily. 90 capsule 2  . levothyroxine (SYNTHROID, LEVOTHROID) 88 MCG tablet Take 88 mcg by mouth daily before breakfast.    . losartan (COZAAR) 50 MG tablet Take 1 tablet (50 mg total) by mouth daily. 45 tablet 1  . LUMIGAN 0.01 % SOLN Place 1 drop into the right eye at bedtime.    . Multiple Vitamins-Minerals (CENTRUM SILVER 50+WOMEN) TABS Take 1 tablet by mouth daily.    Mckinley Jewel Dimesylate (RHOPRESSA OP) Place 1 drop into the right eye at bedtime.    . nitroGLYCERIN (NITROSTAT) 0.4 MG SL tablet Place 1 tablet (0.4 mg total) under the tongue every 5 (five) minutes as needed. For chest pain. 25 tablet 3  . ondansetron (ZOFRAN ODT) 4 MG disintegrating tablet Take 1 tablet (4 mg total) by mouth every 8 (eight) hours as needed for nausea or vomiting. 20 tablet 0  . polyethylene glycol (MIRALAX / GLYCOLAX) 17 g packet Take 17 g by mouth daily as needed for moderate constipation. Also available OTC 30 each 0  . Propylene Glycol (SYSTANE BALANCE) 0.6 % SOLN Apply 1 drop to eye 2 (two) times daily as needed (dry eyes).    . vitamin B-12 (CYANOCOBALAMIN) 100 MCG tablet Take 100 mcg by mouth daily.    Alveda Reasons 20 MG TABS tablet Take 20 mg by mouth daily.     No current facility-administered medications for this visit.   Allergies:  Crestor [rosuvastatin], Eggs or egg-derived products, Lemon juice, Milk-related compounds, Repatha [evolocumab], and Shellfish allergy   Social History: The patient  reports that she has never smoked. She has never used smokeless tobacco. She reports that she does not drink alcohol or use drugs.   Family History: The patient's family history includes Cancer in her brother and mother; Heart attack in her father; Heart disease in her father; Hypertension in her father; Stroke in her mother.   ROS:  Please see the history of present illness. Otherwise, complete review of systems is positive for none.  All other systems are reviewed and negative.   Physical Exam: VS:   BP (!) 180/84   Pulse 72   Ht 5\' 5"  (1.651 m)   Wt 213 lb (96.6 kg)   SpO2 98%   BMI 35.45 kg/m , BMI Body mass index is 35.45 kg/m.  Wt Readings from Last 3 Encounters:  02/09/19 213 lb (96.6 kg)  09/21/18 202 lb 13.2 oz (92 kg)  04/21/18 196 lb (88.9 kg)    General: Patient appears comfortable at rest. Neck: Supple, no elevated JVP or carotid bruits, no thyromegaly. Lungs: Clear to auscultation, nonlabored breathing at rest. Cardiac: Regular rate and rhythm, no S3 or significant systolic murmur, no pericardial rub.  sounds present, no guarding or rebound. Extremities: No pitting edema, distal pulses 2+. Skin: Warm  and dry. Neuropsychiatric: Alert and oriented x3, affect grossly appropriate.  ECG:  An ECG dated February 09, 2019 was personally reviewed today and demonstrated:  Normal sinus rhythm rate of 71, low voltage in all leads.  No ST or T wave changes noted  Recent Labwork: 09/18/2018: ALT 22; AST 32 09/26/2018: BUN 17; Hemoglobin 12.9; Platelets 204; Potassium 4.2; Sodium 140 12/16/2018: Creatinine, Ser 0.97     Component Value Date/Time   CHOL 161 08/17/2017 1002   TRIG 67 08/17/2017 1002   HDL 68 08/17/2017 1002   CHOLHDL 2.4 08/17/2017 1002   CHOLHDL 4 05/15/2014 0947   VLDL 13.6 05/15/2014 0947   LDLCALC 80 08/17/2017 1002   LDLDIRECT 196.5 12/12/2012 1104    Other Studies Reviewed Today: Already having  Echocardiogram 05/29/2011 conclusions:  Left ventricular ejection fraction estimated by 2D at 60 to 65%.  Mild mitral valve regurgitation.  Mild aortic valve regurgitation.  Mildly elevated estimated right ventricular systolic pressure, atrial septal aneurysm.  There is trace of current parenteral pericardial effusion.  [facility] Myoview July 29, 2017  Nuclear stress EF: 69%.  There was no ST segment deviation noted during stress.  The study is normal.  This is a low risk study.  The left ventricular ejection fraction is hyperdynamic (>65%).     Normal resting and stress perfusion. No ischemia or infarction EF69%   Lower extremity Doppler study left-sided 09/26/2018 Summary: Right: There is no evidence of a common femoral vein obstruction. Left: Findings consistent with acute deep vein thrombosis involving the left popliteal vein. No cystic structure found in the popliteal fossa.   Assessment and Plan:  1. Palpitations   2. Dizziness   3. Chest pain, unspecified type   4. Essential hypertension    1. Palpitations Complains of palpitations with associated dizziness. States palpitations occur randomly and occur with and without activity. Get 10 day event monitor to assess for cardiac arrhythmias.  2. Dizziness Complains of dizziness when attempting to stand erect. She states this began to occur after recent fall from her bed.  She describes intermittent feelings of near syncopal sensation but no syncope. Last recorded echocardiogram in 2013 demonstrated mild MR, mild aortic regurgitation, mild elevation of estimated right ventricular systolic pressure, atrial septal aneurysm.  EF 60 to 65%  Get echocardiogram to assess for worsening structural heart disease.  3. Chest pain, unspecified type Chest pain appears to be musculoskeletal in nature.  Patient has cervical stenosis and central cord injury in September from a fall.  She complained of bilateral arm pain and numbness, upper mid chest pain without exertion.  Negative Myoview 2019.  Patient states she takes occasional nitroglycerin with minimal relief of symptoms.  Continue nitroglycerin as needed for chest pain.  4. Essential hypertension Blood pressure elevated on arrival today.  Recheck in left arm 150/82.  Patient is confused as to whether she took her antihypertensive medications this morning.  She is prescribed amlodipine 10 mg daily as well as losartan 50 mg daily.  Patient's granddaughter states patient medication is given out by patient's daughter and has daily alerts  for when to take her medication.  We offered to arrange for [facility] home services to help patient with blood pressure measurement and assistance with medications.  After discussing with patient and granddaughter, they both state they would like to think about it and call us back.  Advised patient's granddaughter to closely monitor medication administration and blood pressures and to call if blood pressures sustained at or  greater than [id number] systolic and/or diastolic blood pressure sustained greater than 80.  Continue losartan and amlodipine as directed.     Medication Adjustments/Labs and Tests Ordered: Current medicines are reviewed at length with the patient today.  Concerns regarding medicines are outlined above.    Patient Instructions  Medication Instructions:  Your physician recommends that you continue on your current medications as directed. Please refer to the Current Medication list given to you today.  *If you need a refill on your cardiac medications before your next appointment, please call your pharmacy*  Lab Work:  TOMORROW 02/10/2019:  CBC, CMET, MAG, TSH, & LIPID (DO NOT EAT OR DRINK AFTER MIDNIGHT TONIGHT)  If you have labs (blood work) drawn today and your tests are completely normal, you will receive your results only by: Marland Kitchen MyChart Message (if you have MyChart) OR . A paper copy in the mail If you have any lab test that is abnormal or we need to change your treatment, we will call you to review the results.  Testing/Procedures: Your physician has requested that you have an echocardiogram 02/22/2019 ARRIVE . Echocardiography is a painless test that uses sound waves to create images of your heart. It provides your doctor with information about the size and shape of your heart and how well your heart's chambers and valves are working. This procedure takes approximately one hour. There are no restrictions for this procedure.   ZIO XT- Long Term Monitor Instructions   Your  physician has requested you wear your ZIO patch monitor 14 days.  You can remove the monitor 02/17/2019   This is a single patch monitor.  [employer] supplies one patch monitor per enrollment.  Additional stickers are not available.   Please do not apply patch if you will be having a Nuclear Stress Test, Echocardiogram, Cardiac CT, MRI, or Chest Xray during the time frame you would be wearing the monitor. The patch cannot be worn during these tests.  You cannot remove and re-apply the ZIO XT patch monitor.      Do not shower for the first 24 hours.  You may shower after the first 24 hours.   Press button if you feel a symptom. You will hear a small click.  Record Date, Time and Symptom in the Patient Log Book.   When you are ready to remove patch, follow instructions on last 2 pages of Patient Log Book.  Stick patch monitor onto last page of Patient Log Book.   Place Patient Log Book in [place] box.  Use locking tab on box and tape box closed securely.  The [employer] has [employer] on it.  Please place in mailbox as soon as possible.  Your physician should have your test results approximately 7 days after the monitor has been mailed back to [facility].   Call [place] at [phone] if you have questions regarding your ZIO XT patch monitor.  Call them immediately if you see an orange light blinking on your monitor.   If your monitor falls off in less than 4 days contact our Monitor department at [phone].  If your monitor becomes loose or falls off after 4 days call [employer] at [phone] for suggestions on securing your monitor.     Follow-Up: At [facility], you and your health needs are our priority.  As part of our continuing mission to provide you with exceptional heart care, we have created designated Provider Care Teams.  These Care Teams include  your primary Cardiologist (physician) and Advanced Practice Providers (APPs -  Physician  Assistants and Nurse Practitioners) who all work together to provide you with the care you need, when you need it.  Your next appointment:   03/07/2019 ARRIVE AT 2:00 FOR REGISTRATION  The format for your next appointment:   In Person  Provider:   Melina Copa, PA-C  Other Instructions           Signed, Levell July, NP 02/10/2019 12:21 AM    [place]  [id number] [place], [place] [address] Phone [phone]

## 2019-02-09 ENCOUNTER — Ambulatory Visit (INDEPENDENT_AMBULATORY_CARE_PROVIDER_SITE_OTHER): Payer: PPO | Admitting: Family Medicine

## 2019-02-09 ENCOUNTER — Encounter: Payer: Self-pay | Admitting: Physician Assistant

## 2019-02-09 ENCOUNTER — Other Ambulatory Visit (INDEPENDENT_AMBULATORY_CARE_PROVIDER_SITE_OTHER): Payer: PPO

## 2019-02-09 ENCOUNTER — Other Ambulatory Visit: Payer: Self-pay

## 2019-02-09 VITALS — BP 180/84 | HR 72 | Ht 65.0 in | Wt 213.0 lb

## 2019-02-09 DIAGNOSIS — R079 Chest pain, unspecified: Secondary | ICD-10-CM

## 2019-02-09 DIAGNOSIS — R002 Palpitations: Secondary | ICD-10-CM

## 2019-02-09 DIAGNOSIS — R42 Dizziness and giddiness: Secondary | ICD-10-CM

## 2019-02-09 DIAGNOSIS — I10 Essential (primary) hypertension: Secondary | ICD-10-CM | POA: Diagnosis not present

## 2019-02-09 NOTE — Patient Instructions (Signed)
Medication Instructions:  Your physician recommends that you continue on your current medications as directed. Please refer to the Current Medication list given to you today.  *If you need a refill on your cardiac medications before your next appointment, please call your pharmacy*  Lab Work:  TOMORROW 02/10/2019:  CBC, CMET, MAG, TSH, & LIPID (DO NOT EAT OR DRINK AFTER MIDNIGHT TONIGHT)  If you have labs (blood work) drawn today and your tests are completely normal, you will receive your results only by: Marland Kitchen MyChart Message (if you have MyChart) OR . A paper copy in the mail If you have any lab test that is abnormal or we need to change your treatment, we will call you to review the results.  Testing/Procedures: Your physician has requested that you have an echocardiogram 02/22/2019 ARRIVE . Echocardiography is a painless test that uses sound waves to create images of your heart. It provides your doctor with information about the size and shape of your heart and how well your heart's chambers and valves are working. This procedure takes approximately one hour. There are no restrictions for this procedure.   ZIO XT- Long Term Monitor Instructions   Your physician has requested you wear your ZIO patch monitor 14 days.  You can remove the monitor 02/17/2019   This is a single patch monitor.  [employer] supplies one patch monitor per enrollment.  Additional stickers are not available.   Please do not apply patch if you will be having a Nuclear Stress Test, Echocardiogram, Cardiac CT, MRI, or Chest Xray during the time frame you would be wearing the monitor. The patch cannot be worn during these tests.  You cannot remove and re-apply the ZIO XT patch monitor.      Do not shower for the first 24 hours.  You may shower after the first 24 hours.   Press button if you feel a symptom. You will hear a small click.  Record Date, Time and Symptom in the Patient Log Book.   When you are ready to remove  patch, follow instructions on last 2 pages of Patient Log Book.  Stick patch monitor onto last page of Patient Log Book.   Place Patient Log Book in [place] box.  Use locking tab on box and tape box closed securely.  The [employer] has [employer] on it.  Please place in mailbox as soon as possible.  Your physician should have your test results approximately 7 days after the monitor has been mailed back to [facility].   Call [place] at [phone] if you have questions regarding your ZIO XT patch monitor.  Call them immediately if you see an orange light blinking on your monitor.   If your monitor falls off in less than 4 days contact our Monitor department at [phone].  If your monitor becomes loose or falls off after 4 days call [employer] at [phone] for suggestions on securing your monitor.     Follow-Up: At [facility], you and your health needs are our priority.  As part of our continuing mission to provide you with exceptional heart care, we have created designated Provider Care Teams.  These Care Teams include your primary Cardiologist (physician) and Advanced Practice Providers (APPs -  Physician Assistants and Nurse Practitioners) who all work together to provide you with the care you need, when you need it.  Your next appointment:   03/07/2019 ARRIVE AT 2:00 FOR REGISTRATION  The format for your next appointment:  In Person  Provider:   Melina Copa, PA-C  Other Instructions

## 2019-02-10 ENCOUNTER — Other Ambulatory Visit: Payer: PPO | Admitting: *Deleted

## 2019-02-10 DIAGNOSIS — I10 Essential (primary) hypertension: Secondary | ICD-10-CM | POA: Diagnosis not present

## 2019-02-10 DIAGNOSIS — R42 Dizziness and giddiness: Secondary | ICD-10-CM | POA: Diagnosis not present

## 2019-02-10 DIAGNOSIS — R002 Palpitations: Secondary | ICD-10-CM | POA: Diagnosis not present

## 2019-02-10 DIAGNOSIS — R079 Chest pain, unspecified: Secondary | ICD-10-CM

## 2019-02-10 LAB — COMPREHENSIVE METABOLIC PANEL
ALT: 12 IU/L (ref 0–32)
AST: 23 IU/L (ref 0–40)
Albumin/Globulin Ratio: 1.4 (ref 1.2–2.2)
Albumin: 3.8 g/dL (ref 3.6–4.6)
Alkaline Phosphatase: 87 IU/L (ref 39–117)
BUN/Creatinine Ratio: 13 (ref 12–28)
BUN: 12 mg/dL (ref 8–27)
Bilirubin Total: <0.2 mg/dL (ref 0.0–1.2)
CO2: 23 mmol/L (ref 20–29)
Calcium: 9.1 mg/dL (ref 8.7–10.3)
Chloride: 105 mmol/L (ref 96–106)
Creatinine, Ser: 0.96 mg/dL (ref 0.57–1.00)
GFR calc Af Amer: 64 mL/min/{1.73_m2} (ref >59)
GFR calc non Af Amer: 55 mL/min/{1.73_m2} — ABNORMAL LOW (ref >59)
Globulin, Total: 2.7 g/dL (ref 1.5–4.5)
Glucose: 99 mg/dL (ref 65–99)
Potassium: 4.4 mmol/L (ref 3.5–5.2)
Sodium: 143 mmol/L (ref 134–144)
Total Protein: 6.5 g/dL (ref 6.0–8.5)

## 2019-02-10 LAB — LIPID PANEL
Chol/HDL Ratio: 3.5 ratio (ref 0.0–4.4)
Cholesterol, Total: 228 mg/dL — ABNORMAL HIGH (ref 100–199)
HDL: 65 mg/dL (ref >39)
LDL Chol Calc (NIH): 154 mg/dL — ABNORMAL HIGH (ref 0–99)
Triglycerides: 55 mg/dL (ref 0–149)
VLDL Cholesterol Cal: 9 mg/dL (ref 5–40)

## 2019-02-10 LAB — CBC
Hematocrit: 36.2 % (ref 34.0–46.6)
Hemoglobin: 12.1 g/dL (ref 11.1–15.9)
MCH: 27.7 pg (ref 26.6–33.0)
MCHC: 33.4 g/dL (ref 31.5–35.7)
MCV: 83 fL (ref 79–97)
Platelets: 273 10*3/uL (ref 150–450)
RBC: 4.37 x10E6/uL (ref 3.77–5.28)
RDW: 14.2 % (ref 11.7–15.4)
WBC: 5.4 10*3/uL (ref 3.4–10.8)

## 2019-02-10 LAB — MAGNESIUM: Magnesium: 2.2 mg/dL (ref 1.6–2.3)

## 2019-02-10 LAB — TSH: TSH: 2.01 u[IU]/mL (ref 0.450–4.500)

## 2019-02-14 ENCOUNTER — Telehealth: Payer: Self-pay | Admitting: *Deleted

## 2019-02-14 NOTE — Telephone Encounter (Signed)
-----   Message from Jeanann Lewandowsky, [place] sent at 02/14/2019  3:53 PM EST -----   ----- Message ----- From: Verta Ellen., NP Sent: 02/13/2019   5:25 PM EST To: Jeanann Lewandowsky, RMA  Yes. She was intolerant to statins and previously did not tolerate Repatha but could possibly discuss Praluent or Nexletol.

## 2019-02-17 ENCOUNTER — Other Ambulatory Visit: Payer: Self-pay

## 2019-02-17 ENCOUNTER — Ambulatory Visit
Admission: RE | Admit: 2019-02-17 | Discharge: 2019-02-17 | Disposition: A | Discharge status: Home / Self Care | Payer: PPO | Source: Ambulatory Visit | Attending: Family Medicine | Admitting: Family Medicine

## 2019-02-17 DIAGNOSIS — Z1231 Encounter for screening mammogram for malignant neoplasm of breast: Secondary | ICD-10-CM | POA: Diagnosis not present

## 2019-02-21 ENCOUNTER — Encounter: Payer: Self-pay | Admitting: *Deleted

## 2019-02-21 NOTE — Telephone Encounter (Signed)
Called/no answer Letter mailed to patient

## 2019-02-22 ENCOUNTER — Other Ambulatory Visit: Payer: Self-pay

## 2019-02-22 ENCOUNTER — Ambulatory Visit (HOSPITAL_COMMUNITY): Payer: PPO | Attending: Cardiology

## 2019-02-22 DIAGNOSIS — R079 Chest pain, unspecified: Secondary | ICD-10-CM

## 2019-02-22 DIAGNOSIS — I10 Essential (primary) hypertension: Secondary | ICD-10-CM | POA: Diagnosis not present

## 2019-02-23 DIAGNOSIS — S14125A Central cord syndrome at C5 level of cervical spinal cord, initial encounter: Secondary | ICD-10-CM | POA: Diagnosis not present

## 2019-02-26 DIAGNOSIS — R002 Palpitations: Secondary | ICD-10-CM | POA: Diagnosis not present

## 2019-02-26 DIAGNOSIS — R42 Dizziness and giddiness: Secondary | ICD-10-CM | POA: Diagnosis not present

## 2019-02-27 DIAGNOSIS — M4802 Spinal stenosis, cervical region: Secondary | ICD-10-CM | POA: Diagnosis not present

## 2019-02-27 DIAGNOSIS — M542 Cervicalgia: Secondary | ICD-10-CM | POA: Diagnosis not present

## 2019-02-27 DIAGNOSIS — S14125S Central cord syndrome at C5 level of cervical spinal cord, sequela: Secondary | ICD-10-CM | POA: Diagnosis not present

## 2019-02-27 DIAGNOSIS — G959 Disease of spinal cord, unspecified: Secondary | ICD-10-CM | POA: Diagnosis not present

## 2019-02-28 NOTE — Telephone Encounter (Signed)
I spoke with patient and reviewed lab results with her. She states she had leg swelling when taking Repatha.  She will discuss cholesterol treatment options at upcoming appointment with Melina Copa, PA

## 2019-03-01 ENCOUNTER — Telehealth: Payer: Self-pay | Admitting: *Deleted

## 2019-03-01 NOTE — Telephone Encounter (Signed)
-----   Message from Verta Ellen., NP sent at 02/28/2019  8:59 PM EST ----- Please call patient tell her that there have been no major changes in her echocardiogram from the previous echocardiogram that might explain her dizziness. Thanks.

## 2019-03-02 ENCOUNTER — Other Ambulatory Visit: Payer: Self-pay | Admitting: *Deleted

## 2019-03-02 NOTE — Patient Outreach (Signed)
Telephone outreach unsuccessful for HTA NP Referral. Pt home phone message says person is not accepting calls at this time. Mobile number mailbox is not set up.  Called Dr. Guido Sander office, left a message for her assistant to request assistance with contacting and engaging with [facility] and or HTA care management services.  Eulah Pont. Myrtie Neither, MSN, [facility] Gerontological Nurse Practitioner [facility] Care Management [phone]

## 2019-03-07 ENCOUNTER — Other Ambulatory Visit: Payer: Self-pay | Admitting: *Deleted

## 2019-03-07 ENCOUNTER — Ambulatory Visit: Payer: PPO | Admitting: Physician Assistant

## 2019-03-07 NOTE — Patient Outreach (Signed)
Telephone outreach.  Ms. Yvonne Tran spoke to me today and agreed to allow me to visit her in her home. We briefly discussed her situation living alone with some help from her daughter and grand daughter. She says it is difficult to be safe.  I will see her tomorrow.  Also advised that Gwynneth Munson, CSW and I work together and we are on the same team working for her to ensure she has the resources she needs.  Eulah Pont. Myrtie Neither, MSN, [facility] Gerontological Nurse Practitioner [facility] Care Management [phone]

## 2019-03-08 ENCOUNTER — Other Ambulatory Visit: Payer: Self-pay | Admitting: *Deleted

## 2019-03-08 NOTE — Patient Outreach (Signed)
[facility]) Care Management  03/08/2019  [place] Buffett [place] [birth year] [id number]   Acute home visit for safety verification per HTA NP Referral.  Mrs. Yvonne Tran-McGlothan is a very pleasant 85 year old lady who has just returned to her independent apt after having had a bad fall, SNF stay, extended stay with her daughter. Everyone that is involved in the case is concerned for her safety (Dr. Leonides Schanz, daughter, grand daughter, CSW, NP).  Pt [place] in a well known senior/handicapped/independent living neighborhood. She has a 1 bedroom, one level apt. It is easy for her to ambulate with her 4 point cane in the house. She does use one wall to balance between the living room and kitchen. There are grab bars in the bath room and an emergency pull cord. She has a transfer bench. She does have a rolling walker to use when she goes out of the house.  She still c/o R sided HA although she bumped her head on the L side. She still has the chronic cervial neck pain She is only taking APAP because everything else makes her more dizzy. (hydrocodone, gabapentin, pregabalin)  She is very well organized with her meds and how she takes them. She keeps a detailed calendar and record of her appts.  Her daughter and grand daughter check in daily by phone and see her several times a week.  Mrs. Yvonne Tran requests:  1)Transportation to appts with a caregiver to assist her. 2)An emergency alert. 3)Advanced Directives documents 4)Information on palliative care and Hospice.  Gwynneth Munson CSW and myself will address these needs.  Pt to ask her daughter to get her a hand held shower head.  MOST IMPORTANT FINDING TODAY WAS  A VERY SIGNIFICANT CHANGE IN PT'S SITTING BP: 164/70                                                      STANDING:   110/50  SHE WAS SYMPTOMATIC, COMPLAINED OF DIZZINESS. Assisted to sit and instructed that she MUST stand for several minutes to get her bearing before  walking. If she becomes dizzy she needs to sit down immediately and rest.  HTA care manager will continue contact with pt.  Eulah Pont. Myrtie Neither, MSN, GNP-BC Gerontological Nurse Practitioner [facility] Care Management [phone]  .

## 2019-03-09 ENCOUNTER — Encounter: Payer: Self-pay | Admitting: *Deleted

## 2019-03-14 ENCOUNTER — Encounter: Payer: Self-pay | Admitting: *Deleted

## 2019-03-14 NOTE — Telephone Encounter (Signed)
Letter mailed with results. 

## 2019-03-16 ENCOUNTER — Other Ambulatory Visit: Payer: Self-pay | Admitting: *Deleted

## 2019-03-16 NOTE — Patient Outreach (Signed)
Telephone call to pt's daughter to report home visit concerns and recommendations (emergency alert, avoiding falls due to orthostatic hypotension, provided advanced directives and information regarding palliative care and Hospice per pt request). Advised HTA, Education officer, museum of transportation needs with caregiver to MD appts if family cannot provide.  Daughter asks if HTA can provide the alert. Will forward this question to SW. Also she asks if her daughter who is a CMT could provide services to pt and be reimbursed. Will find out this information also.  Sent email to Dr. Leonides Schanz about my visit also for her information and referred her to my note in Epic.  Eulah Pont. Myrtie Neither, MSN, [facility] Gerontological Nurse Practitioner [facility] Care Management [phone]

## 2019-03-24 DIAGNOSIS — H04123 Dry eye syndrome of bilateral lacrimal glands: Secondary | ICD-10-CM | POA: Diagnosis not present

## 2019-03-24 DIAGNOSIS — Z961 Presence of intraocular lens: Secondary | ICD-10-CM | POA: Diagnosis not present

## 2019-03-24 DIAGNOSIS — H401122 Primary open-angle glaucoma, left eye, moderate stage: Secondary | ICD-10-CM | POA: Diagnosis not present

## 2019-03-24 DIAGNOSIS — H401113 Primary open-angle glaucoma, right eye, severe stage: Secondary | ICD-10-CM | POA: Diagnosis not present

## 2019-03-31 DIAGNOSIS — I10 Essential (primary) hypertension: Secondary | ICD-10-CM | POA: Diagnosis not present

## 2019-03-31 DIAGNOSIS — E785 Hyperlipidemia, unspecified: Secondary | ICD-10-CM | POA: Diagnosis not present

## 2019-03-31 DIAGNOSIS — I201 Angina pectoris with documented spasm: Secondary | ICD-10-CM | POA: Diagnosis not present

## 2019-03-31 DIAGNOSIS — E039 Hypothyroidism, unspecified: Secondary | ICD-10-CM | POA: Diagnosis not present

## 2019-03-31 DIAGNOSIS — H409 Unspecified glaucoma: Secondary | ICD-10-CM | POA: Diagnosis not present

## 2019-03-31 DIAGNOSIS — I25119 Atherosclerotic heart disease of native coronary artery with unspecified angina pectoris: Secondary | ICD-10-CM | POA: Diagnosis not present

## 2019-04-06 DIAGNOSIS — S14125A Central cord syndrome at C5 level of cervical spinal cord, initial encounter: Secondary | ICD-10-CM | POA: Diagnosis not present

## 2019-04-25 ENCOUNTER — Encounter: Payer: Self-pay | Admitting: Physician Assistant

## 2019-04-25 NOTE — Progress Notes (Deleted)
Cardiology Office Note    Date:  04/25/2019   ID:  Ceclia, Diles 1934/08/01, MRN [id number]  PCP:  Cari Caraway, MD  Cardiologist:  Larae Grooms, MD  Electrophysiologist:  None   Chief Complaint: f/u monitor and echo  History of Present Illness:   Yvonne Tran is a 85 y.o. female with history of PAD s/p prior LE intervention, balance problems, back pain, difficulty swallowing following thyroid operation, pulsatile tinnitus, obesity, glaucoma, HTN, HLD, TIA who presents for evaluation of palpitations and SOB. She has previously followed with Dr. Irish Lack and Dr. Fletcher Anon for PAD and chest pain. Cardiac caths in 2009 and 2012 were negative for significant CAD. Remote echo in 2013 showed EF 60-65%, mild MR/TR, upper normal LA size. Nuclear stress test in 07/2017 was normal.   In 09/2018 she was admitted to the hospital for a fall and found to have C5-C6 spinal stenosis. She subsequently was seen back in the ED and found to have a LLE DVT, placed on anticoagulation. Neurosurgery was consulted who suggested eventual need for surgery. She did not have a CTA. She was seen in clinic 02/2019 by [name] NP reporting dizziness upon standing and associated palpitations. He ordered echo and event monitor. 2D echo 02/22/19 showed EF 55-60%, no RWMA, grade 1 DD, moderately elevated PASP, moderate LAE, mild mitral regurgitation. Event monitor showed NSR, average HR 66, rare PACs/PVCs, benign. Last labs personally reviewed 02/2019 include LDL 154, normal TSH, CBC, Mg, CMET (Cr 0.96). It was recommended to f/u lipid clinic but could not reach patient. She previously was intolerant to statins and did not tolerate Repatha. Of note, on home visit in March she was noted to have drop in BP upon standing from 164/70 to 110/50 with dizziness.  osa eval? anyone else following lipids? Needs daughter with her  Palpitations Dizziness Essential HTN Hyperlipidemia Moderately elevated  pulmonary pressure (pulmonary HTN)   Past Medical History:  Diagnosis Date  . Chest pain with normal coronary angiography 2009, 2012  . DVT (deep venous thrombosis) ([place])    a. LLE 09/2018 following admission for fall.  . Gait instability   . Glaucoma    GLAUCOMA IN   BOTH EYES:  LEGALLY BLIND IN RIGHT EYE  . Goiter    THROID GOITER WITH COMPRESSION--PT HAVING TROUBLE SWALLOWING AND SOB  . Headache(784.0)    LEFT TEMPORAL AREA  . Hyperlipidemia   . Hypertension   . Lipoma   . PAD (peripheral artery disease) ([place])   . Pain    RIGHT THIGH FOR QUITE SOME TIME--LEG GIVES AWAY SOMETIMES.  LOWER BACK PAIN  - S/P HX OF PREVIOUS BACK SURGERY  . Palpitations    a. event monitor 02/2019 NSR with rare PACs, PVCs.  . Peripheral vascular disease ([place])    09/2008 STENT LEFT EXTERNAL ILIAC;  03/20/10 RE-STENT OF LEFT EXTERNAL ILIAC STENT- AT [facility] WITH DR. VARANASI  . Pulmonary hypertension ([place])   . Pulsatile tinnitus   . Stroke [facility])    MINI STROKE  YRS AGO     Past Surgical History:  Procedure Laterality Date  . APPENDECTOMY    . BACK SURGERY  1988   PT STATES AFTER THE BACK SURGERY PT HAS PARALYSIS LEFT ARM AND LEG--TOOK 2&1/2 YEARS TO REGAIN USE OF LEFT SIDE--LEFT SIDE REMAINS WEAKER THAN HER RIFHT SIDE.  Marland Kitchen CARDIAC CATHETERIZATION  2012  . EYE SURGERY     left eye, lens replacement  . MASS EXCISION  01/13/2011  Procedure: EXCISION MASS;  Surgeon: Stark Klein, MD;  Location: [place];  Service: General;  Laterality: Left;  Left abdominal wall mass (subcutaneous, 4-5 cm).  . THYROIDECTOMY  08/14/2011   Procedure: THYROIDECTOMY;  Surgeon: Earnstine Regal, MD;  [facility];  Service: General;  Laterality: N/A;  Total Thyroidectomy  . VASCULAR SURGERY     STENT PLACED 03/2010 RT LEG    Current Medications: No outpatient medications have been marked as taking for the 04/26/19 encounter (Appointment) with Charlie Pitter, PA-C.   ***   Allergies:   Crestor [rosuvastatin], Eggs or egg-derived  products, Lemon juice, Milk-related compounds, Repatha [evolocumab], and Shellfish allergy   Social History   Socioeconomic History  . Marital status: Widowed    Spouse name: Not on file  . Number of children: Not on file  . Years of education: Not on file  . Highest education level: Not on file  Occupational History  . Not on file  Tobacco Use  . Smoking status: Never Smoker  . Smokeless tobacco: Never Used  Substance and Sexual Activity  . Alcohol use: No  . Drug use: No  . Sexual activity: Not on file  Other Topics Concern  . Not on file  Social History Narrative  . Not on file   Social Determinants of Health   Financial Resource Strain:   . Difficulty of Paying Living Expenses:   Food Insecurity:   . Worried About Charity fundraiser in the Last Year:   . Arboriculturist in the Last Year:   Transportation Needs:   . Film/video editor (Medical):   Marland Kitchen Lack of Transportation (Non-Medical):   Physical Activity:   . Days of Exercise per Week:   . Minutes of Exercise per Session:   Stress:   . Feeling of Stress :   Social Connections:   . Frequency of Communication with Friends and Family:   . Frequency of Social Gatherings with Friends and Family:   . Attends Religious Services:   . Active Member of Clubs or Organizations:   . Attends Archivist Meetings:   Marland Kitchen Marital Status:      Family History:  The patient's ***family history includes Cancer in her brother and mother; Heart attack in her father; Heart disease in her father; Hypertension in her father; Stroke in her mother. There is no history of Anesthesia problems, Hypotension, Malignant hyperthermia, Pseudochol deficiency, or Breast cancer.  ROS:   Please see the history of present illness. Otherwise, review of systems is positive for ***.  All other systems are reviewed and otherwise negative.    EKGs/Labs/Other Studies Reviewed:    Studies reviewed are outlined and summarized above. Reports  included below if pertinent.  Nuc 07/2017   Nuclear stress EF: 69%.   There was no ST segment deviation noted during stress.   The study is normal.   This is a low risk study.   The left ventricular ejection fraction is hyperdynamic (>65%).    Normal resting and stress perfusion. No ischemia or infarction EF  69%   Cardiac Cath 2012  IMPRESSION:  1. In-stent restenosis of the left external iliac stent.  2. Successful re-stent with a 9 x 40 Absolute stent postdilated to   greater than 7 atmospheres in diameter.  3. No significant coronary artery disease.  4. Normal left ventricular function.  5. No abdominal aortic aneurysm.  6. Mildly increased left ventricular end-diastolic pressure.  7. No other  significant lower extremity arterial disease. There is   some moderate disease below the right knee.    Recent echo/nuc as outlined above.    EKG:  EKG is ordered today, personally reviewed, demonstrating ***  Recent Labs: 02/10/2019: ALT 12; BUN 12; Creatinine, Ser 0.96; Hemoglobin 12.1; Magnesium 2.2; Platelets 273; Potassium 4.4; Sodium 143; TSH 2.010  Recent Lipid Panel    Component Value Date/Time   CHOL 228 (H) 02/10/2019 1126   TRIG 55 02/10/2019 1126   HDL 65 02/10/2019 1126   CHOLHDL 3.5 02/10/2019 1126   CHOLHDL 4 05/15/2014 0947   VLDL 13.6 05/15/2014 0947   LDLCALC 154 (H) 02/10/2019 1126   LDLDIRECT 196.5 12/12/2012 1104    PHYSICAL EXAM:    VS:  There were no vitals taken for this visit.  BMI: There is no height or weight on file to calculate BMI.  GEN: Well nourished, well developed, in no acute distress HEENT: normocephalic, atraumatic Neck: no JVD, carotid bruits, or masses Cardiac: ***RRR; no murmurs, rubs, or gallops, no edema  Respiratory:  clear to auscultation bilaterally, normal work of breathing GI: soft, nontender, nondistended, + BS MS: no deformity or atrophy Skin: warm and dry, no rash Neuro:  Alert and Oriented x 3, Strength and  sensation are intact, follows commands Psych: euthymic mood, full affect  Wt Readings from Last 3 Encounters:  02/09/19 213 lb (96.6 kg)  09/21/18 202 lb 13.2 oz (92 kg)  04/21/18 196 lb (88.9 kg)     ASSESSMENT & PLAN:   1. ***  Disposition: F/u with ***   Medication Adjustments/Labs and Tests Ordered: Current medicines are reviewed at length with the patient today.  Concerns regarding medicines are outlined above. Medication changes, Labs and Tests ordered today are summarized above and listed in the Patient Instructions accessible in Encounters.   Signed, Charlie Pitter, PA-C  04/25/2019 7:54 AM    [facility], [place], [place]  [address] Phone: [phone]; Fax: [phone]

## 2019-04-26 ENCOUNTER — Ambulatory Visit: Payer: PPO | Admitting: Physician Assistant

## 2019-04-26 DIAGNOSIS — I10 Essential (primary) hypertension: Secondary | ICD-10-CM

## 2019-04-26 DIAGNOSIS — E785 Hyperlipidemia, unspecified: Secondary | ICD-10-CM

## 2019-04-26 DIAGNOSIS — R002 Palpitations: Secondary | ICD-10-CM

## 2019-04-26 DIAGNOSIS — R42 Dizziness and giddiness: Secondary | ICD-10-CM

## 2019-04-26 DIAGNOSIS — I272 Pulmonary hypertension, unspecified: Secondary | ICD-10-CM

## 2019-04-26 NOTE — Progress Notes (Addendum)
Cardiology Office Note    Date:  04/28/2019   ID:  [name], [name] 1934-03-03, MRN [id number]  PCP:  Cari Caraway, MD  Cardiologist:  Larae Grooms, MD  Electrophysiologist:  None   Chief Complaint: f/u palpitations, dizziness, monitor and echo   History of Present Illness:   Yvonne Tran is a 85 y.o. female with history of PAD s/p prior LE intervention, balance problems, back pain, difficulty swallowing following thyroid operation, pulsatile tinnitus, obesity, glaucoma, HTN, HLD, TIA who presents for evaluation of palpitations and SOB. She has previously followed with Dr. Irish Lack and Dr. Fletcher Anon for PAD and chest pain. Cardiac caths in 2009 and 2012 were negative for significant CAD. Remote echo in 2013 showed EF 60-65%, mild MR/TR, upper normal LA size. Nuclear stress test in 07/2017 was normal.   In 09/2018 she was admitted to the hospital for a fall and found to have C5-C6 spinal stenosis. She subsequently was seen back in the ED and found to have a LLE DVT, placed on anticoagulation. Neurosurgery was consulted who suggested eventual need for surgery. She did not have a CTA. Primary care is managing her DVT. She was seen in clinic 02/2019 by [name] NP reporting dizziness upon standing and associated palpitations. He ordered echo and event monitor. 2D echo 02/22/19 showed EF 55-60%, no RWMA, grade 1 DD, moderately elevated PASP, moderate LAE, mild mitral regurgitation. Event monitor showed NSR, average HR 66, rare PACs/PVCs, benign. Last labs personally reviewed 02/2019 include LDL 154, normal TSH, CBC, Mg, CMET (Cr 0.96). It was recommended to f/u lipid clinic but could not reach patient. She previously was intolerant to statins and did not tolerate Repatha. Of note, on home visit in March she was noted to have drop in BP upon standing from 164/70 to 110/50 with dizziness. At that time she was also requesting information on hospice and palliative services.  She  returns for routine follow-up feeling generally stable. She still has daily "flip flop" palpitations similar to when she wore the monitor. She has chronic dizziness upon standing. Her blood pressure was 160/82 on personal recheck today sitting, dropping to 0000000 systolic immediately after standing - she cannot stand for long periods of time due to back pain. She also has chronic left leg pain which she's been told is due to neuropathy. No non-healing sores, edema, CP, syncope, dyspnea reported. She has not previously been evaluated for OSA. Of note, losartan was previously on her medicine list but she indicates she has never taken this and it is not on primary care notes either. (It looks like it was started in the hospital in the past.)   Past Medical History:  Diagnosis Date  . Chest pain with normal coronary angiography 2009, 2012  . DVT (deep venous thrombosis) ([place])    a. LLE 09/2018 following admission for fall.  . Gait instability   . Glaucoma    GLAUCOMA IN   BOTH EYES:  LEGALLY BLIND IN RIGHT EYE  . Goiter    THROID GOITER WITH COMPRESSION--PT HAVING TROUBLE SWALLOWING AND SOB  . Headache(784.0)    LEFT TEMPORAL AREA  . Hyperlipidemia   . Hypertension   . Lipoma   . PAD (peripheral artery disease) ([place])   . Pain    RIGHT THIGH FOR QUITE SOME TIME--LEG GIVES AWAY SOMETIMES.  LOWER BACK PAIN  - S/P HX OF PREVIOUS BACK SURGERY  . Palpitations    a. event monitor 02/2019 NSR with rare PACs, PVCs.  Marland Kitchen  Peripheral vascular disease ([place])    09/2008 STENT LEFT EXTERNAL ILIAC;  03/20/10 RE-STENT OF LEFT EXTERNAL ILIAC STENT- AT [facility] WITH DR. VARANASI  . Pulmonary hypertension ([place])   . Pulsatile tinnitus   . Stroke [facility])    MINI STROKE  YRS AGO     Past Surgical History:  Procedure Laterality Date  . APPENDECTOMY    . BACK SURGERY  1988   PT STATES AFTER THE BACK SURGERY PT HAS PARALYSIS LEFT ARM AND LEG--TOOK 2&1/2 YEARS TO REGAIN USE OF LEFT SIDE--LEFT SIDE REMAINS WEAKER THAN HER RIFHT  SIDE.  Marland Kitchen CARDIAC CATHETERIZATION  2012  . EYE SURGERY     left eye, lens replacement  . MASS EXCISION  01/13/2011   Procedure: EXCISION MASS;  Surgeon: Stark Klein, MD;  Location: [place];  Service: General;  Laterality: Left;  Left abdominal wall mass (subcutaneous, 4-5 cm).  . THYROIDECTOMY  08/14/2011   Procedure: THYROIDECTOMY;  Surgeon: Earnstine Regal, MD;  [facility];  Service: General;  Laterality: N/A;  Total Thyroidectomy  . VASCULAR SURGERY     STENT PLACED 03/2010 RT LEG    Current Medications: Current Meds  Medication Sig  . amLODipine (NORVASC) 10 MG tablet Take 1 tablet (10 mg total) by mouth daily.  Marland Kitchen BIOTIN PO Take 1 tablet by mouth daily.  . COMBIGAN 0.2-0.5 % ophthalmic solution Place 1 drop into the right eye 2 (two) times daily.  Marland Kitchen docusate sodium (COLACE) 100 MG capsule Take 1 capsule (100 mg total) by mouth 2 (two) times daily. Also available over the counter  . gabapentin (NEURONTIN) 300 MG capsule Take 1 capsule (300 mg total) by mouth 3 (three) times daily.  Marland Kitchen levothyroxine (SYNTHROID, LEVOTHROID) 88 MCG tablet Take 88 mcg by mouth daily before breakfast.  . LUMIGAN 0.01 % SOLN Place 1 drop into the right eye at bedtime.  . Multiple Vitamins-Minerals (CENTRUM SILVER 50+WOMEN) TABS Take 1 tablet by mouth daily.  Mckinley Jewel Dimesylate (RHOPRESSA OP) Place 1 drop into the right eye at bedtime.  . nitroGLYCERIN (NITROSTAT) 0.4 MG SL tablet Place 1 tablet (0.4 mg total) under the tongue every 5 (five) minutes as needed. For chest pain.  Marland Kitchen ondansetron (ZOFRAN ODT) 4 MG disintegrating tablet Take 1 tablet (4 mg total) by mouth every 8 (eight) hours as needed for nausea or vomiting.  . polyethylene glycol (MIRALAX / GLYCOLAX) 17 g packet Take 17 g by mouth daily as needed for moderate constipation. Also available OTC  . Propylene Glycol (SYSTANE BALANCE) 0.6 % SOLN Apply 1 drop to eye 2 (two) times daily as needed (dry eyes).  . vitamin B-12 (CYANOCOBALAMIN) 100 MCG  tablet Take 100 mcg by mouth daily.  Alveda Reasons 20 MG TABS tablet Take 20 mg by mouth daily.      Allergies:   Crestor [rosuvastatin], Eggs or egg-derived products, Lemon juice, Milk-related compounds, Repatha [evolocumab], and Shellfish allergy   Social History   Socioeconomic History  . Marital status: Widowed    Spouse name: Not on file  . Number of children: Not on file  . Years of education: Not on file  . Highest education level: Not on file  Occupational History  . Not on file  Tobacco Use  . Smoking status: Never Smoker  . Smokeless tobacco: Never Used  Substance and Sexual Activity  . Alcohol use: No  . Drug use: No  . Sexual activity: Not on file  Other Topics Concern  . Not on  file  Social History Narrative  . Not on file   Social Determinants of Health   Financial Resource Strain:   . Difficulty of Paying Living Expenses:   Food Insecurity:   . Worried About Charity fundraiser in the Last Year:   . Arboriculturist in the Last Year:   Transportation Needs:   . Film/video editor (Medical):   Marland Kitchen Lack of Transportation (Non-Medical):   Physical Activity:   . Days of Exercise per Week:   . Minutes of Exercise per Session:   Stress:   . Feeling of Stress :   Social Connections:   . Frequency of Communication with Friends and Family:   . Frequency of Social Gatherings with Friends and Family:   . Attends Religious Services:   . Active Member of Clubs or Organizations:   . Attends Archivist Meetings:   Marland Kitchen Marital Status:      Family History:  The patient's family history includes Cancer in her brother and mother; Heart attack in her father; Heart disease in her father; Hypertension in her father; Stroke in her mother. There is no history of Anesthesia problems, Hypotension, Malignant hyperthermia, Pseudochol deficiency, or Breast cancer.  ROS:   Please see the history of present illness.  All other systems are reviewed and otherwise  negative.    EKGs/Labs/Other Studies Reviewed:    Studies reviewed are outlined and summarized above. Reports included below if pertinent.  Nuc 07/2017   Nuclear stress EF: 69%.   There was no ST segment deviation noted during stress.   The study is normal.   This is a low risk study.   The left ventricular ejection fraction is hyperdynamic (>65%).    Normal resting and stress perfusion. No ischemia or infarction EF  69%   Cardiac Cath 2012  IMPRESSION:  1. In-stent restenosis of the left external iliac stent.  2. Successful re-stent with a 9 x 40 Absolute stent postdilated to   greater than 7 atmospheres in diameter.  3. No significant coronary artery disease.  4. Normal left ventricular function.  5. No abdominal aortic aneurysm.  6. Mildly increased left ventricular end-diastolic pressure.  7. No other significant lower extremity arterial disease. There is   some moderate disease below the right knee.    Recent echo/nuc as outlined above.        EKG:  EKG is not ordered today  Recent Labs: 02/10/2019: ALT 12; BUN 12; Creatinine, Ser 0.96; Hemoglobin 12.1; Magnesium 2.2; Platelets 273; Potassium 4.4; Sodium 143; TSH 2.010  Recent Lipid Panel    Component Value Date/Time   CHOL 228 (H) 02/10/2019 1126   TRIG 55 02/10/2019 1126   HDL 65 02/10/2019 1126   CHOLHDL 3.5 02/10/2019 1126   CHOLHDL 4 05/15/2014 0947   VLDL 13.6 05/15/2014 0947   LDLCALC 154 (H) 02/10/2019 1126   LDLDIRECT 196.5 12/12/2012 1104    PHYSICAL EXAM:    VS:  BP (!) 168/70   Pulse 75   Ht 5\' 5"  (1.651 m)   Wt 211 lb (95.7 kg)   SpO2 96%   BMI 35.11 kg/m   BMI: Body mass index is 35.11 kg/m.  GEN: Well nourished, well developed AAF, in no acute distress HEENT: normocephalic, atraumatic Neck: no JVD, carotid bruits, or masses Cardiac: RRR; no ectopy, no murmurs, rubs, or gallops, no edema, 1+ pedal pulses bilaterally without any non-healing wounds Respiratory:  clear to  auscultation bilaterally, normal work of  breathing GI: soft, nontender, nondistended, + BS MS: no deformity or atrophy Skin: warm and dry, no rash Neuro:  Alert and Oriented x 3, Strength and sensation are intact, follows commands Psych: euthymic mood, reserved affect  Wt Readings from Last 3 Encounters:  04/28/19 211 lb (95.7 kg)  02/09/19 213 lb (96.6 kg)  09/21/18 202 lb 13.2 oz (92 kg)     ASSESSMENT & PLAN:   1. Palpitations - event monitor reassuring without any malignant arrhythmias or atrial fib. This likely represents PACs/PVCs. Average HR was in the 60s on monitor so would follow conservatively for now. 2. Orthostasis likely contributing to dizziness - unclear etiology, question related to neuropathic issues over time. Will update CBC and BMET today to exclude metabolic abnormality. She has history of PAD but distal ABIs were OK in 2018 therefore we discussed utilizing therapeutic compression hose as a trial to combat the fall in blood pressure. Would avoid more aggressive compression due to her history of PAD. Unfortunately she remains hypertensive at rest but with her orthostasis and history of significant fall, it does not seem safe to aggressively treat her elevated blood pressure (especially while on anticoagulation). Will have her trial the compression hose and advised f/u with primary care in 1 week. Rx given for 15-80mmHg thigh high. If her blood pressure differential narrows and she is no longer orthostatic, consideration could be given to uptitration of her BP regimen at that time. Abdominal binder would be another option. She is not a candidate for Florinef or Midodrine at this time due to resting HTN. 3. Essential HTN - see above. Conservative management may be the best situation. 4. Hyperlipidemia - LDL too high by last check. She reports swelling with prior injectables and statin intolerance. Nexletol may be an option. She is agreeable to seeing pharmD lipid clinic  again. 5. Moderately elevated pulmonary pressure (pulmonary HTN) - given high BMI and elevated blood pressure my primary suspicion would be sleep apnea. However, she wishes to hold off on further testing for now but I told her to let us know if she changes her mind. 6. PAD - reports chronic left leg pain and back pain that she's been told is neuropathic in etiology. Will update lower extremity arterial vascular testing. ([place] ABI +IVC/iliac duplex) She is not on ASA at this time as she is on Xarelto for h/o DVT. When/if she comes off Xarelto, if appropriate, would consider replacing with baby aspirin again. Do not feel she is a candidate to be on both at the same time due to fall risk.  Disposition: F/u with Dr. Irish Lack in office in 6 months. Otherwise since cardiac status has been stable, recommend f/u with primary care for blood pressure management.  Medication Adjustments/Labs and Tests Ordered: Current medicines are reviewed at length with the patient today.  Concerns regarding medicines are outlined above. Medication changes, Labs and Tests ordered today are summarized above and listed in the Patient Instructions accessible in Encounters.   Signed, Charlie Pitter, PA-C  04/28/2019 11:06 AM    [facility] [place], [place], [place]  [address] Phone: [phone]; Fax: [phone]

## 2019-04-28 ENCOUNTER — Encounter: Payer: Self-pay | Admitting: Physician Assistant

## 2019-04-28 ENCOUNTER — Ambulatory Visit (INDEPENDENT_AMBULATORY_CARE_PROVIDER_SITE_OTHER): Payer: PPO | Admitting: Physician Assistant

## 2019-04-28 ENCOUNTER — Other Ambulatory Visit: Payer: Self-pay

## 2019-04-28 VITALS — BP 168/70 | HR 75 | Ht 65.0 in | Wt 211.0 lb

## 2019-04-28 DIAGNOSIS — E785 Hyperlipidemia, unspecified: Secondary | ICD-10-CM | POA: Diagnosis not present

## 2019-04-28 DIAGNOSIS — I10 Essential (primary) hypertension: Secondary | ICD-10-CM

## 2019-04-28 DIAGNOSIS — R42 Dizziness and giddiness: Secondary | ICD-10-CM

## 2019-04-28 DIAGNOSIS — I951 Orthostatic hypotension: Secondary | ICD-10-CM

## 2019-04-28 DIAGNOSIS — I272 Pulmonary hypertension, unspecified: Secondary | ICD-10-CM | POA: Diagnosis not present

## 2019-04-28 DIAGNOSIS — R002 Palpitations: Secondary | ICD-10-CM

## 2019-04-28 DIAGNOSIS — I739 Peripheral vascular disease, unspecified: Secondary | ICD-10-CM | POA: Diagnosis not present

## 2019-04-28 LAB — BASIC METABOLIC PANEL
BUN/Creatinine Ratio: 11 — ABNORMAL LOW (ref 12–28)
BUN: 10 mg/dL (ref 8–27)
CO2: 26 mmol/L (ref 20–29)
Calcium: 9.4 mg/dL (ref 8.7–10.3)
Chloride: 104 mmol/L (ref 96–106)
Creatinine, Ser: 0.89 mg/dL (ref 0.57–1.00)
GFR calc Af Amer: 70 mL/min/{1.73_m2} (ref >59)
GFR calc non Af Amer: 61 mL/min/{1.73_m2} (ref >59)
Glucose: 94 mg/dL (ref 65–99)
Potassium: 3.9 mmol/L (ref 3.5–5.2)
Sodium: 142 mmol/L (ref 134–144)

## 2019-04-28 LAB — CBC
Hematocrit: 38 % (ref 34.0–46.6)
Hemoglobin: 12.1 g/dL (ref 11.1–15.9)
MCH: 27.1 pg (ref 26.6–33.0)
MCHC: 31.8 g/dL (ref 31.5–35.7)
MCV: 85 fL (ref 79–97)
Platelets: 253 10*3/uL (ref 150–450)
RBC: 4.47 x10E6/uL (ref 3.77–5.28)
RDW: 15.3 % (ref 11.7–15.4)
WBC: 4.7 10*3/uL (ref 3.4–10.8)

## 2019-04-28 NOTE — Patient Instructions (Addendum)
Medication Instructions:  Your physician recommends that you continue on your current medications as directed. Please refer to the Current Medication list given to you today.  *If you need a refill on your cardiac medications before your next appointment, please call your pharmacy*   Lab Work: TODAY:  BMET & CBC  If you have labs (blood work) drawn today and your tests are completely normal, you will receive your results only by: Marland Kitchen MyChart Message (if you have MyChart) OR . A paper copy in the mail If you have any lab test that is abnormal or we need to change your treatment, we will call you to review the results.   Testing/Procedures: Your physician has requested that you have a lower or upper extremity arterial duplex. (2 orders)  This test is an ultrasound of the arteries in the legs or arms. It looks at arterial blood flow in the legs and arms. Allow one hour for Lower and Upper Arterial scans. There are no restrictions or special instructions   Follow-Up: At [facility], you and your health needs are our priority.  As part of our continuing mission to provide you with exceptional heart care, we have created designated Provider Care Teams.  These Care Teams include your primary Cardiologist (physician) and Advanced Practice Providers (APPs -  Physician Assistants and Nurse Practitioners) who all work together to provide you with the care you need, when you need it.  We recommend signing up for the patient portal called "MyChart".  Sign up information is provided on this After Visit Summary.  MyChart is used to connect with patients for Virtual Visits (Telemedicine).  Patients are able to view lab/test results, encounter notes, upcoming appointments, etc.  Non-urgent messages can be sent to your provider as well.   To learn more about what you can do with MyChart, go to [email or web address].     Follow-UP: You need to see the Lipid Clinic Again.  We will get you  scheduled.  Your next appointment:   6 month(s)  The format for your next appointment:   In Person  Provider:   Casandra Doffing, MD   Other Instructions Please see your Primary Care Dr in 1 week to reassess blood pressure response from wearing the compression stockings.   Please let us know if you'd like to pursue a sleep apnea test since you had elevated blood pressures in your lungs on heart ultrasound. Otherwise your heart ultrasound showed normal heart function.

## 2019-05-03 DIAGNOSIS — S14125S Central cord syndrome at C5 level of cervical spinal cord, sequela: Secondary | ICD-10-CM | POA: Diagnosis not present

## 2019-05-03 DIAGNOSIS — G959 Disease of spinal cord, unspecified: Secondary | ICD-10-CM | POA: Diagnosis not present

## 2019-05-03 DIAGNOSIS — M542 Cervicalgia: Secondary | ICD-10-CM | POA: Diagnosis not present

## 2019-05-03 DIAGNOSIS — M4802 Spinal stenosis, cervical region: Secondary | ICD-10-CM | POA: Diagnosis not present

## 2019-05-05 DIAGNOSIS — E039 Hypothyroidism, unspecified: Secondary | ICD-10-CM | POA: Diagnosis not present

## 2019-05-05 DIAGNOSIS — E785 Hyperlipidemia, unspecified: Secondary | ICD-10-CM | POA: Diagnosis not present

## 2019-05-05 DIAGNOSIS — I201 Angina pectoris with documented spasm: Secondary | ICD-10-CM | POA: Diagnosis not present

## 2019-05-05 DIAGNOSIS — I10 Essential (primary) hypertension: Secondary | ICD-10-CM | POA: Diagnosis not present

## 2019-05-05 DIAGNOSIS — H409 Unspecified glaucoma: Secondary | ICD-10-CM | POA: Diagnosis not present

## 2019-05-05 DIAGNOSIS — I25119 Atherosclerotic heart disease of native coronary artery with unspecified angina pectoris: Secondary | ICD-10-CM | POA: Diagnosis not present

## 2019-05-08 ENCOUNTER — Other Ambulatory Visit (HOSPITAL_COMMUNITY): Payer: Self-pay | Admitting: Neurosurgery

## 2019-05-08 ENCOUNTER — Other Ambulatory Visit: Payer: Self-pay | Admitting: Neurosurgery

## 2019-05-08 DIAGNOSIS — G959 Disease of spinal cord, unspecified: Secondary | ICD-10-CM

## 2019-05-12 ENCOUNTER — Other Ambulatory Visit (HOSPITAL_COMMUNITY): Payer: Self-pay | Admitting: Cardiovascular Disease

## 2019-05-12 ENCOUNTER — Ambulatory Visit (HOSPITAL_BASED_OUTPATIENT_CLINIC_OR_DEPARTMENT_OTHER)
Admission: RE | Admit: 2019-05-12 | Discharge: 2019-05-12 | Disposition: A | Discharge status: Home / Self Care | Payer: PPO | Source: Ambulatory Visit | Attending: Physician Assistant | Admitting: Physician Assistant

## 2019-05-12 ENCOUNTER — Other Ambulatory Visit (HOSPITAL_COMMUNITY): Payer: Self-pay | Admitting: Psychiatry

## 2019-05-12 ENCOUNTER — Ambulatory Visit (HOSPITAL_COMMUNITY)
Admission: RE | Admit: 2019-05-12 | Discharge: 2019-05-12 | Disposition: A | Discharge status: Home / Self Care | Payer: PPO | Source: Ambulatory Visit | Attending: Cardiology | Admitting: Cardiology

## 2019-05-12 ENCOUNTER — Other Ambulatory Visit: Payer: Self-pay

## 2019-05-12 DIAGNOSIS — Z95820 Peripheral vascular angioplasty status with implants and grafts: Secondary | ICD-10-CM | POA: Diagnosis not present

## 2019-05-12 DIAGNOSIS — I739 Peripheral vascular disease, unspecified: Secondary | ICD-10-CM

## 2019-05-12 DIAGNOSIS — Z95828 Presence of other vascular implants and grafts: Secondary | ICD-10-CM

## 2019-05-13 ENCOUNTER — Encounter (HOSPITAL_COMMUNITY): Payer: Self-pay | Admitting: Emergency Medicine

## 2019-05-13 ENCOUNTER — Emergency Department (HOSPITAL_COMMUNITY): Payer: PPO

## 2019-05-13 ENCOUNTER — Emergency Department (HOSPITAL_BASED_OUTPATIENT_CLINIC_OR_DEPARTMENT_OTHER): Payer: PPO

## 2019-05-13 ENCOUNTER — Emergency Department (HOSPITAL_COMMUNITY)
Admission: EM | Admit: 2019-05-13 | Discharge: 2019-05-13 | Disposition: A | Discharge status: Home / Self Care | Payer: PPO | Attending: Emergency Medicine | Admitting: Emergency Medicine

## 2019-05-13 ENCOUNTER — Other Ambulatory Visit: Payer: Self-pay

## 2019-05-13 DIAGNOSIS — R600 Localized edema: Secondary | ICD-10-CM | POA: Diagnosis not present

## 2019-05-13 DIAGNOSIS — I10 Essential (primary) hypertension: Secondary | ICD-10-CM | POA: Diagnosis not present

## 2019-05-13 DIAGNOSIS — M7989 Other specified soft tissue disorders: Secondary | ICD-10-CM

## 2019-05-13 DIAGNOSIS — Z7901 Long term (current) use of anticoagulants: Secondary | ICD-10-CM | POA: Diagnosis not present

## 2019-05-13 DIAGNOSIS — M79604 Pain in right leg: Secondary | ICD-10-CM | POA: Insufficient documentation

## 2019-05-13 DIAGNOSIS — R609 Edema, unspecified: Secondary | ICD-10-CM

## 2019-05-13 DIAGNOSIS — M79609 Pain in unspecified limb: Secondary | ICD-10-CM

## 2019-05-13 DIAGNOSIS — E890 Postprocedural hypothyroidism: Secondary | ICD-10-CM | POA: Diagnosis not present

## 2019-05-13 DIAGNOSIS — M1711 Unilateral primary osteoarthritis, right knee: Secondary | ICD-10-CM | POA: Diagnosis not present

## 2019-05-13 MED ORDER — DICLOFENAC SODIUM 1 % EX GEL: 2.0000 g | Freq: Four times a day (QID) | CUTANEOUS | 0 refills | Status: DC | PRN

## 2019-05-13 NOTE — Progress Notes (Signed)
Right lower extremity venous duplex complete. Please see CV Proc tab for preliminary results.  Preliminary results given to Dr. Laverta Baltimore @ 14:00. Lita Mains- RDMS, RVT 3:19 PM  05/13/2019

## 2019-05-13 NOTE — ED Triage Notes (Signed)
Per pt, states she is on Xerelto for DVT-states she woke up on Friday with right leg swelling-wants to make sure she does not have a blood clot

## 2019-05-13 NOTE — ED Notes (Signed)
Patient ambulated to the restroom with her walker and minimal assistance

## 2019-05-13 NOTE — ED Notes (Signed)
Patient transported to X-ray 

## 2019-05-13 NOTE — ED Notes (Signed)
Discharge paperwork reviewed with pt and pts daughter. Pt with no questions at this time.  Pt wheeled to ED entrance.

## 2019-05-13 NOTE — ED Provider Notes (Signed)
Emergency Department Provider Note   I have reviewed the triage vital signs and the nursing notes.   HISTORY  Chief Complaint Leg Swelling   HPI Yvonne Tran is a 85 y.o. female with PMH reviewed below including prior DVT on Xarelto presents to the ED with right leg pain. Symptoms have been present for the last 2 days and swelling started yesterday. She denies injury, fever, redness, SOB, or CP. She has a history of DVT on the left and has been on Xarelto since late last year. She is strictly compliant with this med and has not missed dose. She is ambulatory with a walker at baseline. No radiation of symptoms or other modifying factors.    Past Medical History:  Diagnosis Date  . Chest pain with normal coronary angiography 2009, 2012  . DVT (deep venous thrombosis) ([place])    a. LLE 09/2018 following admission for fall.  . Gait instability   . Glaucoma    GLAUCOMA IN   BOTH EYES:  LEGALLY BLIND IN RIGHT EYE  . Goiter    THROID GOITER WITH COMPRESSION--PT HAVING TROUBLE SWALLOWING AND SOB  . Headache(784.0)    LEFT TEMPORAL AREA  . Hyperlipidemia   . Hypertension   . Lipoma   . Orthostasis   . PAD (peripheral artery disease) ([place])   . Pain    RIGHT THIGH FOR QUITE SOME TIME--LEG GIVES AWAY SOMETIMES.  LOWER BACK PAIN  - S/P HX OF PREVIOUS BACK SURGERY  . Palpitations    a. event monitor 02/2019 NSR with rare PACs, PVCs.  . Peripheral vascular disease ([place])    09/2008 STENT LEFT EXTERNAL ILIAC;  03/20/10 RE-STENT OF LEFT EXTERNAL ILIAC STENT- AT [facility] WITH DR. VARANASI  . Pulmonary hypertension ([place])   . Pulsatile tinnitus   . Stroke [facility])    MINI STROKE  YRS AGO     Patient Active Problem List   Diagnosis Date Noted  . Central cord syndrome at C5 level of cervical spinal cord ([place]) 09/17/2018  . PAD (peripheral artery disease) ([place]) 02/14/2014  . Mixed hyperlipidemia 02/10/2013  . Pain in limb 02/10/2013  . Essential hypertension, benign 02/10/2013  . Cough  10/07/2012  . Hypothyroidism, postsurgical 09/08/2011  . Multinodular goiter (nontoxic) 07/08/2011  . Abdominal wall mass of left upper quadrant, 4 x 5 cm in anterior axillary line. 11/10/2010    Past Surgical History:  Procedure Laterality Date  . APPENDECTOMY    . BACK SURGERY  1988   PT STATES AFTER THE BACK SURGERY PT HAS PARALYSIS LEFT ARM AND LEG--TOOK 2&1/2 YEARS TO REGAIN USE OF LEFT SIDE--LEFT SIDE REMAINS WEAKER THAN HER RIFHT SIDE.  Marland Kitchen CARDIAC CATHETERIZATION  2012  . EYE SURGERY     left eye, lens replacement  . MASS EXCISION  01/13/2011   Procedure: EXCISION MASS;  Surgeon: Stark Klein, MD;  Location: [place];  Service: General;  Laterality: Left;  Left abdominal wall mass (subcutaneous, 4-5 cm).  . THYROIDECTOMY  08/14/2011   Procedure: THYROIDECTOMY;  Surgeon: Earnstine Regal, MD;  Location: [facility];  Service: General;  Laterality: N/A;  Total Thyroidectomy  . VASCULAR SURGERY     STENT PLACED 03/2010 RT LEG    Allergies Crestor [rosuvastatin], Eggs or egg-derived products, Lemon juice, Milk-related compounds, Repatha [evolocumab], and Shellfish allergy  Family History  Problem Relation Age of Onset  . Cancer Mother        brain  . Stroke Mother   . Heart disease Father   .  Heart attack Father   . Hypertension Father   . Cancer Brother        prostate  . Anesthesia problems Neg Hx   . Hypotension Neg Hx   . Malignant hyperthermia Neg Hx   . Pseudochol deficiency Neg Hx   . Breast cancer Neg Hx     Social History Social History   Tobacco Use  . Smoking status: Never Smoker  . Smokeless tobacco: Never Used  Substance Use Topics  . Alcohol use: No  . Drug use: No    Review of Systems  Constitutional: No fever/chills Cardiovascular: Denies chest pain. Respiratory: Denies shortness of breath. Gastrointestinal: No abdominal pain.   Musculoskeletal: Positive right leg pain and leg swelling.  Skin: Negative for rash. Neurological: Negative for headaches,  focal weakness or numbness.  10-point ROS otherwise negative.  ____________________________________________   PHYSICAL EXAM:  VITAL SIGNS: ED Triage Vitals  Enc Vitals Group     BP 05/13/19 1203 (!) 148/81     Pulse Rate 05/13/19 1203 78     Resp 05/13/19 1203 18     Temp 05/13/19 1203 98.3 F (36.8 C)     Temp Source 05/13/19 1203 Oral     SpO2 05/13/19 1203 100 %   Constitutional: Alert and oriented. Well appearing and in no acute distress. Eyes: Conjunctivae are normal.  Head: Atraumatic. Nose: No congestion/rhinnorhea. Mouth/Throat: Mucous membranes are moist.  Neck: No stridor.   Cardiovascular: Normal rate, regular rhythm. Good peripheral circulation. Grossly normal heart sounds.   Respiratory: Normal respiratory effort.  No retractions. Lungs CTAB. Gastrointestinal: Soft and nontender. No distention.  Musculoskeletal: No lower extremity edema (unilateral). No pitting edema. No redness or warmth in the lower extremities. Mild tenderness in the anterior, proximal tibia.  Neurologic:  Normal speech and language.  Skin:  Skin is warm, dry and intact. No rash noted.  ____________________________________________  RADIOLOGY  DG Knee Complete 4 Views Right  Result Date: 05/13/2019 CLINICAL DATA:  Pain EXAM: RIGHT KNEE - COMPLETE 4+ VIEW COMPARISON:  None. FINDINGS: Frontal, lateral, and bilateral oblique views were obtained. No fracture or dislocation. No appreciable joint effusion. There is severe joint space narrowing medially. There is moderate narrowing of the patellofemoral joint. There is spurring medially. No erosive change. There is chondrocalcinosis. IMPRESSION: Osteoarthritic change, most marked medially. There is chondrocalcinosis, a finding that may be seen with osteoarthritis or calcium pyrophosphate deposition disease. There is no evident fracture, dislocation, or joint effusion. Electronically Signed   By: Lowella Grip III M.D.   On: 05/13/2019 14:07   VAS  [place] LOWER EXTREMITY VENOUS (DVT) (ONLY [facility] & [facility] 7a-7p)  Result Date: 05/13/2019  Lower Venous DVTStudy Indications: Pain, Swelling, and Edema.  Anticoagulation: Xarelto. Comparison Study: 09/26/2018 LLE Performing Technologist: Antonieta Pert RDMS, RVT  Examination Guidelines: A complete evaluation includes B-mode imaging, spectral Doppler, color Doppler, and power Doppler as needed of all accessible portions of each vessel. Bilateral testing is considered an integral part of a complete examination. Limited examinations for reoccurring indications may be performed as noted. The reflux portion of the exam is performed with the patient in reverse Trendelenburg.  +---------+---------------+---------+-----------+----------+-------------------+ RIGHT    CompressibilityPhasicitySpontaneityPropertiesThrombus Aging      +---------+---------------+---------+-----------+----------+-------------------+ CFV      Full           Yes      Yes                                      +---------+---------------+---------+-----------+----------+-------------------+  SFJ      Full                                                             +---------+---------------+---------+-----------+----------+-------------------+ FV Prox  Full                                                             +---------+---------------+---------+-----------+----------+-------------------+ FV Mid   Full                                                             +---------+---------------+---------+-----------+----------+-------------------+ FV DistalFull                                                             +---------+---------------+---------+-----------+----------+-------------------+ PFV      Full                                                             +---------+---------------+---------+-----------+----------+-------------------+ POP      Full           Yes      Yes                                       +---------+---------------+---------+-----------+----------+-------------------+ PTV      Full                                                             +---------+---------------+---------+-----------+----------+-------------------+ PERO     Full                                         somewhat difficult                                                        visualization       +---------+---------------+---------+-----------+----------+-------------------+ GSV      Full                                                             +---------+---------------+---------+-----------+----------+-------------------+   +----+---------------+---------+-----------+----------+--------------+  LEFTCompressibilityPhasicitySpontaneityPropertiesThrombus Aging +----+---------------+---------+-----------+----------+--------------+ CFV Full           Yes      Yes                                 +----+---------------+---------+-----------+----------+--------------+     Summary: RIGHT: - There is no evidence of deep vein thrombosis in the lower extremity. However, portions of this examination were limited- see technologist comments above.  - No cystic structure found in the popliteal fossa.  LEFT: - No evidence of common femoral vein obstruction.  *See table(s) above for measurements and observations.    Preliminary     ____________________________________________   PROCEDURES  Procedure(s) performed:   Procedures  None  ____________________________________________   INITIAL IMPRESSION / ASSESSMENT AND PLAN / ED COURSE  Pertinent labs & imaging results that were available during my care of the patient were reviewed by me and considered in my medical decision making (see chart for details).   Patient presents emergency department for evaluation of right leg pain. No appreciable swelling. No cellulitis. No CP/SOB symptoms. DVT US and plain films orders.  Ambulatory at baseline here.   DVT US negative. Plain films with OA but no acute findings. Plan for topical pain medication and close PCP follow up.  ____________________________________________  FINAL CLINICAL IMPRESSION(S) / ED DIAGNOSES  Final diagnoses:  Right leg pain    NEW OUTPATIENT MEDICATIONS STARTED DURING THIS VISIT:  Discharge Medication List as of 05/13/2019  2:55 PM    START taking these medications   Details  diclofenac Sodium (VOLTAREN) 1 % GEL Apply 2 g topically 4 (four) times daily as needed., Starting Sat 05/13/2019, Normal        Note:  This document was prepared using Dragon voice recognition software and may include unintentional dictation errors.  Nanda Quinton, MD, [facility] Emergency [facility]    Nylia Gavina, [facility], MD 05/14/19 [phone]

## 2019-05-13 NOTE — Discharge Instructions (Signed)
You were seen in the emergency room today with right leg pain.  You do not have a blood clot in the leg and your x-ray was normal.  You have some arthritis in the leg.  I have prescribed some arthritis gel to rub in the area.  Please return to the emergency department any new or suddenly worsening symptoms and call your primary care doctor for close follow-up appointment.

## 2019-05-18 ENCOUNTER — Other Ambulatory Visit: Payer: Self-pay

## 2019-05-18 ENCOUNTER — Ambulatory Visit (INDEPENDENT_AMBULATORY_CARE_PROVIDER_SITE_OTHER): Payer: PPO | Admitting: Pharmacist

## 2019-05-18 DIAGNOSIS — M791 Myalgia, unspecified site: Secondary | ICD-10-CM | POA: Diagnosis not present

## 2019-05-18 DIAGNOSIS — T466X5A Adverse effect of antihyperlipidemic and antiarteriosclerotic drugs, initial encounter: Secondary | ICD-10-CM

## 2019-05-18 DIAGNOSIS — E782 Mixed hyperlipidemia: Secondary | ICD-10-CM

## 2019-05-18 NOTE — Patient Instructions (Signed)
It was a pleasure to meet you!  I will submit a prior authorization for Nexletol. I will call you once I have a determination.  I will apply for the [employer] grant for you. The information I will need to know is your SSN and annual income.  Call us at [phone] with any questions or concerns

## 2019-05-18 NOTE — Progress Notes (Signed)
Patient ID: Yvonne Tran                 DOB: Jul 31, 1934                    MRN: [id number]     HPI: Kaylia Cowing Tran is a 85 y.o. female patient of Dr. Hassell Done referred to lipid clinic by Melina Copa. PMH is significant for PAD s/p prior LE intervention, balance problems, back pain, difficulty swallowing following thyroid operation, pulsatile tinnitus, obesity, glaucoma, HTN, HLD and TIA. She was seen in lipid clinic back in 2019 and started on Repatha due to statin intolerance. Unfortunately reported leg swelling with Repatha.   Patient presents to lipid clinic today to discuss other options. Patient is concerned about how much medication she takes and taking a medication for the rest of her life. She does not want any more needles. She had questions about diet. Upset that she doesn't eat eggs or milk, but its in some many things she buys and upset about the junk they put in foods these days. Also had questions about her levothyroxine that I answered for her.  Current Medications: none Intolerances: Lipitor, Crestor, lovastatin, and simvastatin (myalgias with all). Did not remember doses or duration of her previous statin use. [facility] pharmacy could only search back to the beginning of 2014 and reported that patient took simvastatin 10mg  daily in 2014. Patient took Livalo 2mg  daily up until mid 2015 when she reported dizziness and not feeling well overall. She also felt poorly on the ezetimibe. Repatha-leg swelling Risk Factors: PAD s/p stenting, TIA LDL goal: <70, non HDL <100  Diet: low Na bacon, no milk, no eggs, sausgae  Exercise: none-walks with a walker (balance issues)  Family History: Cancer in her brother and mother; Heart attack in her father; Heart disease in her father; Hypertension in her father; Stroke in her mother.  Social History: denies alcohol and tobacco products  Labs:02/10/19: TC 228, TG 55, HDL 65, LDL 154 non HDL 163  Past Medical History:    Diagnosis Date  . Chest pain with normal coronary angiography 2009, 2012  . DVT (deep venous thrombosis) ([place])    a. LLE 09/2018 following admission for fall.  . Gait instability   . Glaucoma    GLAUCOMA IN   BOTH EYES:  LEGALLY BLIND IN RIGHT EYE  . Goiter    THROID GOITER WITH COMPRESSION--PT HAVING TROUBLE SWALLOWING AND SOB  . Headache(784.0)    LEFT TEMPORAL AREA  . Hyperlipidemia   . Hypertension   . Lipoma   . Orthostasis   . PAD (peripheral artery disease) ([place])   . Pain    RIGHT THIGH FOR QUITE SOME TIME--LEG GIVES AWAY SOMETIMES.  LOWER BACK PAIN  - S/P HX OF PREVIOUS BACK SURGERY  . Palpitations    a. event monitor 02/2019 NSR with rare PACs, PVCs.  . Peripheral vascular disease ([place])    09/2008 STENT LEFT EXTERNAL ILIAC;  03/20/10 RE-STENT OF LEFT EXTERNAL ILIAC STENT- AT [facility] WITH DR. VARANASI  . Pulmonary hypertension ([place])   . Pulsatile tinnitus   . Stroke [facility])    MINI STROKE  YRS AGO     Current Outpatient Medications on File Prior to Visit  Medication Sig Dispense Refill  . amLODipine (NORVASC) 10 MG tablet Take 1 tablet (10 mg total) by mouth daily. 30 tablet 3  . BIOTIN PO Take 1 tablet by mouth daily.    . COMBIGAN 0.2-0.5 %  ophthalmic solution Place 1 drop into the right eye 2 (two) times daily.    . diclofenac Sodium (VOLTAREN) 1 % GEL Apply 2 g topically 4 (four) times daily as needed. 50 g 0  . docusate sodium (COLACE) 100 MG capsule Take 1 capsule (100 mg total) by mouth 2 (two) times daily. Also available over the counter 60 capsule 3  . gabapentin (NEURONTIN) 300 MG capsule Take 1 capsule (300 mg total) by mouth 3 (three) times daily. 90 capsule 2  . levothyroxine (SYNTHROID, LEVOTHROID) 88 MCG tablet Take 88 mcg by mouth daily before breakfast.    . LUMIGAN 0.01 % SOLN Place 1 drop into the right eye at bedtime.    . Multiple Vitamins-Minerals (CENTRUM SILVER 50+WOMEN) TABS Take 1 tablet by mouth daily.    [place] Jewel Dimesylate (RHOPRESSA OP) Place  1 drop into the right eye at bedtime.    . nitroGLYCERIN (NITROSTAT) 0.4 MG SL tablet Place 1 tablet (0.4 mg total) under the tongue every 5 (five) minutes as needed. For chest pain. 25 tablet 3  . ondansetron (ZOFRAN ODT) 4 MG disintegrating tablet Take 1 tablet (4 mg total) by mouth every 8 (eight) hours as needed for nausea or vomiting. 20 tablet 0  . polyethylene glycol (MIRALAX / GLYCOLAX) 17 g packet Take 17 g by mouth daily as needed for moderate constipation. Also available OTC 30 each 0  . Propylene Glycol (SYSTANE BALANCE) 0.6 % SOLN Apply 1 drop to eye 2 (two) times daily as needed (dry eyes).    . vitamin B-12 (CYANOCOBALAMIN) 100 MCG tablet Take 100 mcg by mouth daily.    Alveda Reasons 20 MG TABS tablet Take 20 mg by mouth daily.     No current facility-administered medications on file prior to visit.    Allergies  Allergen Reactions  . Crestor [Rosuvastatin]     MYALGIAS  . Eggs Or Egg-Derived Products Itching, Nausea Only and Swelling  . Lemon Juice Itching, Nausea Only and Swelling  . Milk-Related Compounds Itching, Nausea Only and Swelling  . Repatha [Evolocumab]     Leg swelling and aches  . Shellfish Allergy Itching, Nausea Only and Swelling    Assessment/Plan:  1. Hyperlipidemia - LDL is above goal of <70. Discussed options of trying Praluent vs Nexletol. Advised there would be better LDL reduction with Praluent. Patient does not want any more needles. She is agreeable to trying Nexletol. Reviewed LDL reduction of about 20%, that it has been on the market 1 year, but low incidence of side effects. Explained PA process and that I will submit for healthwell grant (medication is non-formulary). She is aware I will need to know her SSN and annual income.   Thank you,  Ramond Dial, Pharm.D, BCPS, CPP [place]  [id number], [place], [place] [address]  Phone: [phone]; Fax: [phone]

## 2019-05-19 ENCOUNTER — Telehealth: Payer: Self-pay | Admitting: Pharmacist

## 2019-05-19 NOTE — Telephone Encounter (Signed)
Left message on machine asking for call back.  Need patient's income and SSN for [employer]

## 2019-05-22 ENCOUNTER — Encounter (HOSPITAL_COMMUNITY): Payer: Self-pay

## 2019-05-22 ENCOUNTER — Ambulatory Visit (HOSPITAL_COMMUNITY): Admission: RE | Admit: 2019-05-22 | Payer: PPO | Source: Ambulatory Visit

## 2019-05-22 NOTE — Telephone Encounter (Signed)
Called patient, she stated she was trying to leaving for an appointment and she would call me back later

## 2019-05-22 NOTE — Telephone Encounter (Signed)
Prior auth for [employer] approved through 01/05/20

## 2019-05-23 DIAGNOSIS — M25561 Pain in right knee: Secondary | ICD-10-CM | POA: Diagnosis not present

## 2019-05-23 DIAGNOSIS — E559 Vitamin D deficiency, unspecified: Secondary | ICD-10-CM | POA: Diagnosis not present

## 2019-05-23 DIAGNOSIS — M48062 Spinal stenosis, lumbar region with neurogenic claudication: Secondary | ICD-10-CM | POA: Diagnosis not present

## 2019-05-23 DIAGNOSIS — S14125S Central cord syndrome at C5 level of cervical spinal cord, sequela: Secondary | ICD-10-CM | POA: Diagnosis not present

## 2019-05-23 DIAGNOSIS — E785 Hyperlipidemia, unspecified: Secondary | ICD-10-CM | POA: Diagnosis not present

## 2019-05-23 DIAGNOSIS — S14125A Central cord syndrome at C5 level of cervical spinal cord, initial encounter: Secondary | ICD-10-CM | POA: Diagnosis not present

## 2019-05-23 DIAGNOSIS — I25119 Atherosclerotic heart disease of native coronary artery with unspecified angina pectoris: Secondary | ICD-10-CM | POA: Diagnosis not present

## 2019-05-23 DIAGNOSIS — I82432 Acute embolism and thrombosis of left popliteal vein: Secondary | ICD-10-CM | POA: Diagnosis not present

## 2019-05-23 DIAGNOSIS — M4802 Spinal stenosis, cervical region: Secondary | ICD-10-CM | POA: Diagnosis not present

## 2019-05-23 DIAGNOSIS — H409 Unspecified glaucoma: Secondary | ICD-10-CM | POA: Diagnosis not present

## 2019-05-23 DIAGNOSIS — E039 Hypothyroidism, unspecified: Secondary | ICD-10-CM | POA: Diagnosis not present

## 2019-05-23 DIAGNOSIS — I10 Essential (primary) hypertension: Secondary | ICD-10-CM | POA: Diagnosis not present

## 2019-05-23 DIAGNOSIS — I739 Peripheral vascular disease, unspecified: Secondary | ICD-10-CM | POA: Diagnosis not present

## 2019-05-24 NOTE — Telephone Encounter (Signed)
Patient will come in Monday with information needed for grant

## 2019-05-29 MED ORDER — NEXLETOL 180 MG PO TABS: 1.0000 | ORAL_TABLET | Freq: Every day | ORAL | 11 refills | Status: DC

## 2019-05-29 NOTE — Telephone Encounter (Addendum)
[employer] Grant approved through 04/27/20  Pharmacy Card Id [id number] Group [id number] PCN [id number] BIN [id number]  Rx sent to [facility] per pt request. Fatima Sanger info faxed

## 2019-05-29 NOTE — Addendum Note (Signed)
Addended by: Marcelle Overlie D on: 05/29/2019 04:04 PM   Modules accepted: Orders

## 2019-05-29 NOTE — Addendum Note (Signed)
Addended by: Marcelle Overlie D on: 05/29/2019 02:18 PM   Modules accepted: Orders

## 2019-05-30 ENCOUNTER — Ambulatory Visit: Payer: PPO

## 2019-05-31 ENCOUNTER — Ambulatory Visit: Payer: PPO | Attending: Internal Medicine

## 2019-05-31 DIAGNOSIS — Z23 Encounter for immunization: Secondary | ICD-10-CM

## 2019-05-31 NOTE — Progress Notes (Signed)
   Covid-19 Vaccination Clinic  Name:  PAMILLA BOUGH    MRN: [id number] DOB: May 18, 1934  05/31/2019  Ms. Tafari-McGlothan was observed post Covid-19 immunization for 15 minutes without incident. She was provided with Vaccine Information Sheet and instruction to access the V-Safe system.   Ms. Rochell was instructed to call 911 with any severe reactions post vaccine: Marland Kitchen Difficulty breathing  . Swelling of face and throat  . A fast heartbeat  . A bad rash all over body  . Dizziness and weakness   Immunizations Administered    Name Date Dose VIS Date Route   Pfizer COVID-19 Vaccine 05/31/2019  9:25 AM 0.3 mL 03/01/2018 Intramuscular   Manufacturer: [place]   Lot: [id number]   [place]: [id number]

## 2019-05-31 NOTE — Progress Notes (Addendum)
Patient was monitored for a total of 50 minutes. After 15 minutes observation, patient complained of tingling in the right hand. Patient was then monitored for an additional 5 minutes, during this wait time, patient passed out. Patient was able to verify name within a few seconds of this episode. Vital signs were obtained. Initial vitals were 146/121 oS 91% Pulse 88.   Vital signs @ 1005 were   169/74, pulse 68 and 99% O2 saturation.   Vital signs @ 1015 were  160/82 Pulse 75 98% O2 saturation.    Patient stated she felt fine but felt tired, no other complaints.   Orthostatics obtained are below:  Lying  Sitting  Standing   160/76 139/82 125/67  73 77 80   Patient's grand-daughter Iver Nestle picked her up. She was notified about the incident and was instructed to call 911 if Yvonne Tran  Has difficulty breathing, elevated heart rate, swellin in the face or throat. She verbalized understanding

## 2019-06-13 ENCOUNTER — Ambulatory Visit (INDEPENDENT_AMBULATORY_CARE_PROVIDER_SITE_OTHER): Payer: PPO | Admitting: Cardiovascular Disease

## 2019-06-13 ENCOUNTER — Other Ambulatory Visit: Payer: Self-pay

## 2019-06-13 ENCOUNTER — Encounter: Payer: Self-pay | Admitting: Cardiovascular Disease

## 2019-06-13 VITALS — BP 140/72 | HR 60 | Temp 97.2°F | Resp 18 | Ht 65.0 in | Wt 208.6 lb

## 2019-06-13 DIAGNOSIS — I739 Peripheral vascular disease, unspecified: Secondary | ICD-10-CM | POA: Diagnosis not present

## 2019-06-13 DIAGNOSIS — I10 Essential (primary) hypertension: Secondary | ICD-10-CM

## 2019-06-13 DIAGNOSIS — E785 Hyperlipidemia, unspecified: Secondary | ICD-10-CM

## 2019-06-13 MED ORDER — ASPIRIN EC 81 MG PO TBEC: 81.0000 mg | DELAYED_RELEASE_TABLET | Freq: Every day | ORAL | 3 refills | Status: AC

## 2019-06-13 NOTE — Progress Notes (Signed)
Cardiology Office Note   Date:  06/13/2019   ID:  Yvonne, Tran [birth year], MRN [id number]  PCP:  Cari Caraway, MD  Cardiologist:  Dr. Irish Lack  No chief complaint on file.     History of Present Illness: Yvonne Tran is a 85 y.o. female who is here for a follow-up visit regarding peripheral arterial disease. She had previous left iliac stenting twice most recently in 2012 for in-stent restenosis. She has known history of recurrent chest pain with no evidence of significant coronary artery disease on previous cardiac catheterization in 2009 and 2012. She is not diabetic and does not smoke. She does have known history of hypertension. She reports having back surgery done in 1986 after a spine fracture. She had very slow recovery at that time and never regained her functional capacity that she had before the event.  She has chronic lower back and bilateral leg pain at rest and with activities that is thought to be multifactorial due to spine disease as well as possible mild claudication.  She was hospitalized in September of last year after a fall and was found to have C5-C6 spinal stenosis.  She was also diagnosed subsequently with left lower extremity DVT and was placed on anticoagulation.  She had recent issues of palpitations and dizziness.  Most recent vascular studies last month showed an ABI of 0.97 on the right and 0.94 on the left with abnormal toe pressure.  Duplex showed significant right common iliac artery stenosis with peak velocity of 421.  There was also significant stenosis in the left external iliac artery with velocity close to 400.  She reports continuous pain in her legs even at rest with numbness.  In addition, she has exertional right calf discomfort.  She has chronic low back pain.  It is difficult for her to distinguish the pain coming from her spine than real claudication.  She is going to see Dr. Vertell Limber next week to decide if she needs  to have back surgery.  Past Medical History:  Diagnosis Date  . Chest pain with normal coronary angiography 2009, 2012  . DVT (deep venous thrombosis) ([place])    a. LLE 09/2018 following admission for fall.  . Gait instability   . Glaucoma    GLAUCOMA IN   BOTH EYES:  LEGALLY BLIND IN RIGHT EYE  . Goiter    THROID GOITER WITH COMPRESSION--PT HAVING TROUBLE SWALLOWING AND SOB  . Headache(784.0)    LEFT TEMPORAL AREA  . Hyperlipidemia   . Hypertension   . Lipoma   . Orthostasis   . PAD (peripheral artery disease) ([place])   . Pain    RIGHT THIGH FOR QUITE SOME TIME--LEG GIVES AWAY SOMETIMES.  LOWER BACK PAIN  - S/P HX OF PREVIOUS BACK SURGERY  . Palpitations    a. event monitor 02/2019 NSR with rare PACs, PVCs.  . Peripheral vascular disease ([place])    09/2008 STENT LEFT EXTERNAL ILIAC;  03/20/10 RE-STENT OF LEFT EXTERNAL ILIAC STENT- AT [facility] WITH DR. VARANASI  . Pulmonary hypertension ([place])   . Pulsatile tinnitus   . Stroke [facility])    MINI STROKE  YRS AGO     Past Surgical History:  Procedure Laterality Date  . APPENDECTOMY    . BACK SURGERY  1988   PT STATES AFTER THE BACK SURGERY PT HAS PARALYSIS LEFT ARM AND LEG--TOOK 2&1/2 YEARS TO REGAIN USE OF LEFT SIDE--LEFT SIDE REMAINS WEAKER THAN HER RIFHT SIDE.  Marland Kitchen CARDIAC CATHETERIZATION  2012  . EYE SURGERY     left eye, lens replacement  . MASS EXCISION  01/13/2011   Procedure: EXCISION MASS;  Surgeon: Stark Klein, MD;  Location: [place];  Service: General;  Laterality: Left;  Left abdominal wall mass (subcutaneous, 4-5 cm).  . THYROIDECTOMY  08/14/2011   Procedure: THYROIDECTOMY;  Surgeon: Earnstine Regal, MD;  Location: [facility];  Service: General;  Laterality: N/A;  Total Thyroidectomy  . VASCULAR SURGERY     STENT PLACED 03/2010 RT LEG     Current Outpatient Medications  Medication Sig Dispense Refill  . amLODipine (NORVASC) 10 MG tablet Take 1 tablet (10 mg total) by mouth daily. 30 tablet 3  . Bempedoic Acid (NEXLETOL) 180 MG TABS Take 1  tablet by mouth daily. 30 tablet 11  . BIOTIN PO Take 1 tablet by mouth daily.    . COMBIGAN 0.2-0.5 % ophthalmic solution Place 1 drop into the right eye 2 (two) times daily.    . diclofenac Sodium (VOLTAREN) 1 % GEL Apply 2 g topically 4 (four) times daily as needed. 50 g 0  . docusate sodium (COLACE) 100 MG capsule Take 1 capsule (100 mg total) by mouth 2 (two) times daily. Also available over the counter 60 capsule 3  . gabapentin (NEURONTIN) 300 MG capsule Take 1 capsule (300 mg total) by mouth 3 (three) times daily. 90 capsule 2  . levothyroxine (SYNTHROID, LEVOTHROID) 88 MCG tablet Take 88 mcg by mouth daily before breakfast.    . LUMIGAN 0.01 % SOLN Place 1 drop into the right eye at bedtime.    . Multiple Vitamins-Minerals (CENTRUM SILVER 50+WOMEN) TABS Take 1 tablet by mouth daily.    Mckinley Jewel Dimesylate (RHOPRESSA OP) Place 1 drop into the right eye at bedtime.    . nitroGLYCERIN (NITROSTAT) 0.4 MG SL tablet Place 1 tablet (0.4 mg total) under the tongue every 5 (five) minutes as needed. For chest pain. 25 tablet 3  . ondansetron (ZOFRAN ODT) 4 MG disintegrating tablet Take 1 tablet (4 mg total) by mouth every 8 (eight) hours as needed for nausea or vomiting. 20 tablet 0  . polyethylene glycol (MIRALAX / GLYCOLAX) 17 g packet Take 17 g by mouth daily as needed for moderate constipation. Also available OTC 30 each 0  . Propylene Glycol (SYSTANE BALANCE) 0.6 % SOLN Apply 1 drop to eye 2 (two) times daily as needed (dry eyes).    . vitamin B-12 (CYANOCOBALAMIN) 100 MCG tablet Take 100 mcg by mouth daily.    Alveda Reasons 20 MG TABS tablet Take 20 mg by mouth daily.     No current facility-administered medications for this visit.    Allergies:   Crestor [rosuvastatin], Eggs or egg-derived products, Lemon juice, Milk-related compounds, Repatha [evolocumab], and Shellfish allergy    Social History:  The patient  reports that she has never smoked. She has never used smokeless tobacco. She  reports that she does not drink alcohol or use drugs.   Family History:  The patient's family history includes Cancer in her brother and mother; Heart attack in her father; Heart disease in her father; Hypertension in her father; Stroke in her mother.    ROS:  Please see the history of present illness.   Otherwise, review of systems are positive for none.   All other systems are reviewed and negative.    PHYSICAL EXAM: VS:  BP 140/72   Ht 5\' 5"  (1.651 m)   Wt 208 lb 9.6 oz (  94.6 kg)   BMI 34.71 kg/m  , BMI Body mass index is 34.71 kg/m. GEN: Well nourished, well developed, in no acute distress  HEENT: normal  Neck: no JVD, carotid bruits, or masses Cardiac: RRR; no murmurs, rubs, or gallops,no edema  Respiratory:  clear to auscultation bilaterally, normal work of breathing GI: soft, nontender, nondistended, + BS MS: no deformity or atrophy  Skin: warm and dry, no rash Neuro:  Strength and sensation are intact Psych: euthymic mood, full affect Vascular: Femoral pulses are +1 bilaterally. Distal pulses are faint on the left.  Could not palpate on the right.  EKG:  EKG is not ordered today.    Recent Labs: 02/10/2019: ALT 12; Magnesium 2.2; TSH 2.010 04/28/2019: BUN 10; Creatinine, Ser 0.89; Hemoglobin 12.1; Platelets 253; Potassium 3.9; Sodium 142    Lipid Panel    Component Value Date/Time   CHOL 228 (H) 02/10/2019 1126   TRIG 55 02/10/2019 1126   HDL 65 02/10/2019 1126   CHOLHDL 3.5 02/10/2019 1126   CHOLHDL 4 05/15/2014 0947   VLDL 13.6 05/15/2014 0947   LDLCALC 154 (H) 02/10/2019 1126   LDLDIRECT 196.5 12/12/2012 1104      Wt Readings from Last 3 Encounters:  06/13/19 208 lb 9.6 oz (94.6 kg)  04/28/19 211 lb (95.7 kg)  02/09/19 213 lb (96.6 kg)         ASSESSMENT AND PLAN:  1.  Peripheral arterial disease: She has known history of left external iliac artery stenting in the past.  There seems to be progression of her iliac disease especially right common  iliac.  However, she has multiple complaints and different kind of discomfort which makes it difficult to determine if she has true claudication or not.  Ultimately, we might decide to proceed with revascularization to see if we can improve some of her symptoms.  She is going to see Dr. Vertell Limber next week regarding her back situation whether she needs to have another surgery or not. In the meanwhile, I asked her to resume aspirin 81 mg once daily given that she is no longer on Xarelto. I will reevaluate her symptoms in 3 months.  2. Hyperlipidemia: Unfortunately, she is intolerant to statins.  She was recently started on Bempedioc acid.   3. Essential hypertension blood pressure is reasonably controlled on current medications.   Disposition:   FU with me in 3 months.   Signed,  Kathlyn Sacramento, MD  06/13/2019 10:06 AM    [place]

## 2019-06-13 NOTE — Patient Instructions (Signed)
Medication Instructions:  Start Aspirin 81 mg daily   *If you need a refill on your cardiac medications before your next appointment, please call your pharmacy*   Follow-Up: At [facility], you and your health needs are our priority.  As part of our continuing mission to provide you with exceptional heart care, we have created designated Provider Care Teams.  These Care Teams include your primary Cardiologist (physician) and Advanced Practice Providers (APPs -  Physician Assistants and Nurse Practitioners) who all work together to provide you with the care you need, when you need it.  We recommend signing up for the patient portal called "MyChart".  Sign up information is provided on this After Visit Summary.  MyChart is used to connect with patients for Virtual Visits (Telemedicine).  Patients are able to view lab/test results, encounter notes, upcoming appointments, etc.  Non-urgent messages can be sent to your provider as well.   To learn more about what you can do with MyChart, go to [email or web address].    Your next appointment:   3 month(s)  The format for your next appointment:   In Person  Provider:   Kathlyn Sacramento, MD

## 2019-06-16 ENCOUNTER — Ambulatory Visit (HOSPITAL_COMMUNITY)
Admission: RE | Admit: 2019-06-16 | Discharge: 2019-06-16 | Disposition: A | Discharge status: Home / Self Care | Payer: PPO | Source: Ambulatory Visit | Attending: Neurosurgery | Admitting: Neurosurgery

## 2019-06-16 ENCOUNTER — Other Ambulatory Visit: Payer: Self-pay

## 2019-06-16 DIAGNOSIS — M4802 Spinal stenosis, cervical region: Secondary | ICD-10-CM | POA: Diagnosis not present

## 2019-06-16 DIAGNOSIS — M50221 Other cervical disc displacement at C4-C5 level: Secondary | ICD-10-CM | POA: Diagnosis not present

## 2019-06-16 DIAGNOSIS — G959 Disease of spinal cord, unspecified: Secondary | ICD-10-CM | POA: Diagnosis not present

## 2019-06-23 DIAGNOSIS — S14125A Central cord syndrome at C5 level of cervical spinal cord, initial encounter: Secondary | ICD-10-CM | POA: Diagnosis not present

## 2019-06-26 ENCOUNTER — Ambulatory Visit: Payer: PPO

## 2019-06-29 ENCOUNTER — Ambulatory Visit: Payer: PPO | Attending: Internal Medicine

## 2019-06-29 DIAGNOSIS — Z23 Encounter for immunization: Secondary | ICD-10-CM

## 2019-06-29 NOTE — Progress Notes (Signed)
   Covid-19 Vaccination Clinic  Name:  Yvonne Tran    MRN: [id number] DOB: 10-Jan-1934  06/29/2019  Ms. Yvonne Tran was observed post Covid-19 immunization for 30 minutes based on pre-vaccination screening without incident. She was provided with Vaccine Information Sheet and instruction to access the V-Safe system.   Ms. Yvonne Tran was instructed to call 911 with any severe reactions post vaccine: Marland Kitchen Difficulty breathing  . Swelling of face and throat  . A fast heartbeat  . A bad rash all over body  . Dizziness and weakness   Immunizations Administered    Name Date Dose VIS Date Route   Pfizer COVID-19 Vaccine 06/29/2019  9:42 AM 0.3 mL 03/01/2018 Intramuscular   Manufacturer: [place]   Lot: [id number]   [place]: [id number]

## 2019-06-30 DIAGNOSIS — I201 Angina pectoris with documented spasm: Secondary | ICD-10-CM | POA: Diagnosis not present

## 2019-06-30 DIAGNOSIS — I25119 Atherosclerotic heart disease of native coronary artery with unspecified angina pectoris: Secondary | ICD-10-CM | POA: Diagnosis not present

## 2019-06-30 DIAGNOSIS — I10 Essential (primary) hypertension: Secondary | ICD-10-CM | POA: Diagnosis not present

## 2019-06-30 DIAGNOSIS — E785 Hyperlipidemia, unspecified: Secondary | ICD-10-CM | POA: Diagnosis not present

## 2019-06-30 DIAGNOSIS — E039 Hypothyroidism, unspecified: Secondary | ICD-10-CM | POA: Diagnosis not present

## 2019-06-30 DIAGNOSIS — H409 Unspecified glaucoma: Secondary | ICD-10-CM | POA: Diagnosis not present

## 2019-07-06 ENCOUNTER — Encounter (HOSPITAL_COMMUNITY): Payer: Self-pay

## 2019-07-06 ENCOUNTER — Emergency Department (HOSPITAL_COMMUNITY): Payer: PPO

## 2019-07-06 ENCOUNTER — Emergency Department (HOSPITAL_COMMUNITY)
Admission: EM | Admit: 2019-07-06 | Discharge: 2019-07-06 | Disposition: A | Discharge status: Home / Self Care | Payer: PPO | Attending: Emergency Medicine | Admitting: Emergency Medicine

## 2019-07-06 ENCOUNTER — Other Ambulatory Visit: Payer: Self-pay

## 2019-07-06 DIAGNOSIS — I10 Essential (primary) hypertension: Secondary | ICD-10-CM | POA: Diagnosis not present

## 2019-07-06 DIAGNOSIS — E039 Hypothyroidism, unspecified: Secondary | ICD-10-CM | POA: Insufficient documentation

## 2019-07-06 DIAGNOSIS — R748 Abnormal levels of other serum enzymes: Secondary | ICD-10-CM | POA: Diagnosis not present

## 2019-07-06 DIAGNOSIS — Z8679 Personal history of other diseases of the circulatory system: Secondary | ICD-10-CM | POA: Diagnosis not present

## 2019-07-06 DIAGNOSIS — I700 Atherosclerosis of aorta: Secondary | ICD-10-CM | POA: Diagnosis not present

## 2019-07-06 DIAGNOSIS — J8410 Pulmonary fibrosis, unspecified: Secondary | ICD-10-CM | POA: Diagnosis not present

## 2019-07-06 DIAGNOSIS — Z86718 Personal history of other venous thrombosis and embolism: Secondary | ICD-10-CM | POA: Diagnosis not present

## 2019-07-06 DIAGNOSIS — R0789 Other chest pain: Secondary | ICD-10-CM | POA: Diagnosis not present

## 2019-07-06 DIAGNOSIS — R079 Chest pain, unspecified: Secondary | ICD-10-CM | POA: Diagnosis present

## 2019-07-06 DIAGNOSIS — R531 Weakness: Secondary | ICD-10-CM | POA: Diagnosis not present

## 2019-07-06 DIAGNOSIS — I2510 Atherosclerotic heart disease of native coronary artery without angina pectoris: Secondary | ICD-10-CM | POA: Diagnosis not present

## 2019-07-06 DIAGNOSIS — J9811 Atelectasis: Secondary | ICD-10-CM | POA: Diagnosis not present

## 2019-07-06 LAB — BASIC METABOLIC PANEL
Anion gap: 10 (ref 5–15)
BUN: 13 mg/dL (ref 8–23)
CO2: 27 mmol/L (ref 22–32)
Calcium: 8.7 mg/dL — ABNORMAL LOW (ref 8.9–10.3)
Chloride: 104 mmol/L (ref 98–111)
Creatinine, Ser: 0.98 mg/dL (ref 0.44–1.00)
GFR calc Af Amer: >60 mL/min (ref >60)
GFR calc non Af Amer: 54 mL/min — ABNORMAL LOW (ref >60)
Glucose, Bld: 99 mg/dL (ref 70–99)
Potassium: 4 mmol/L (ref 3.5–5.1)
Sodium: 141 mmol/L (ref 135–145)

## 2019-07-06 LAB — HEPATIC FUNCTION PANEL
ALT: 18 U/L (ref 0–44)
AST: 27 U/L (ref 15–41)
Albumin: 3.9 g/dL (ref 3.5–5.0)
Alkaline Phosphatase: 77 U/L (ref 38–126)
Bilirubin, Direct: <0.1 mg/dL (ref 0.0–0.2)
Total Bilirubin: 0.6 mg/dL (ref 0.3–1.2)
Total Protein: 7.1 g/dL (ref 6.5–8.1)

## 2019-07-06 LAB — D-DIMER, QUANTITATIVE: D-Dimer, Quant: 3.4 ug/mL-FEU — ABNORMAL HIGH (ref 0.00–0.50)

## 2019-07-06 LAB — CBC
HCT: 39 % (ref 36.0–46.0)
Hemoglobin: 12.6 g/dL (ref 12.0–15.0)
MCH: 28.4 pg (ref 26.0–34.0)
MCHC: 32.3 g/dL (ref 30.0–36.0)
MCV: 87.8 fL (ref 80.0–100.0)
Platelets: 241 10*3/uL (ref 150–400)
RBC: 4.44 MIL/uL (ref 3.87–5.11)
RDW: 15.6 % — ABNORMAL HIGH (ref 11.5–15.5)
WBC: 5.8 10*3/uL (ref 4.0–10.5)
nRBC: 0 % (ref 0.0–0.2)

## 2019-07-06 LAB — CK
Total CK: 411 U/L — ABNORMAL HIGH (ref 38–234)
Total CK: 396 U/L — ABNORMAL HIGH (ref 38–234)

## 2019-07-06 LAB — TROPONIN I (HIGH SENSITIVITY)
Troponin I (High Sensitivity): 4 ng/L (ref <18)
Troponin I (High Sensitivity): 4 ng/L (ref <18)

## 2019-07-06 MED ORDER — SODIUM CHLORIDE 0.9% FLUSH
3.0000 mL | Freq: Once | INTRAVENOUS | Status: AC
  Administered 2019-07-06: 3 mL via INTRAVENOUS

## 2019-07-06 MED ORDER — SODIUM CHLORIDE 0.9 % IV BOLUS
1000.0000 mL | Freq: Once | INTRAVENOUS | Status: AC
  Administered 2019-07-06: 1000 mL via INTRAVENOUS

## 2019-07-06 MED ORDER — IOHEXOL 350 MG/ML SOLN
100.0000 mL | Freq: Once | INTRAVENOUS | Status: AC | PRN
  Administered 2019-07-06: 100 mL via INTRAVENOUS

## 2019-07-06 NOTE — Discharge Instructions (Signed)
Drink plenty of fluids

## 2019-07-06 NOTE — ED Provider Notes (Signed)
[place] DEPT Provider Note   CSN: [id number] Arrival date & time: 07/06/19  1450     History Chief Complaint  Patient presents with  . Chest Pain  . Dizziness  . Fatigue    Yvonne Tran is a 85 y.o. female.  Pt presents to the ED today with CP.  Pt said it's been going on since she had her 2nd Covid vaccine on 6/24.  Pt said she's also felt dizzy and tired.  She denies sob.  Pt does have a hx of DVT, but is not currently on blood thinners.  Pt said she has a hx of CAD, but has had no evidence of CAD on cath 2009 and 2012.  The pt does have PAD and has had left iliac stenting twice; last in 2012.           Past Medical History:  Diagnosis Date  . Chest pain with normal coronary angiography 2009, 2012  . DVT (deep venous thrombosis) ([place])    a. LLE 09/2018 following admission for fall.  . Gait instability   . Glaucoma    GLAUCOMA IN   BOTH EYES:  LEGALLY BLIND IN RIGHT EYE  . Goiter    THROID GOITER WITH COMPRESSION--PT HAVING TROUBLE SWALLOWING AND SOB  . Headache(784.0)    LEFT TEMPORAL AREA  . Hyperlipidemia   . Hypertension   . Lipoma   . Orthostasis   . PAD (peripheral artery disease) ([place])   . Pain    RIGHT THIGH FOR QUITE SOME TIME--LEG GIVES AWAY SOMETIMES.  LOWER BACK PAIN  - S/P HX OF PREVIOUS BACK SURGERY  . Palpitations    a. event monitor 02/2019 NSR with rare PACs, PVCs.  . Peripheral vascular disease ([place])    09/2008 STENT LEFT EXTERNAL ILIAC;  03/20/10 RE-STENT OF LEFT EXTERNAL ILIAC STENT- AT [facility] WITH DR. VARANASI  . Pulmonary hypertension ([place])   . Pulsatile tinnitus   . Stroke [facility])    MINI STROKE  YRS AGO     Patient Active Problem List   Diagnosis Date Noted  . Myalgia due to statin 05/18/2019  . Central cord syndrome at C5 level of cervical spinal cord ([place]) 09/17/2018  . PAD (peripheral artery disease) ([place]) 02/14/2014  . Mixed hyperlipidemia 02/10/2013  . Pain in limb 02/10/2013  . Essential  hypertension, benign 02/10/2013  . Cough 10/07/2012  . Hypothyroidism, postsurgical 09/08/2011  . Multinodular goiter (nontoxic) 07/08/2011  . Abdominal wall mass of left upper quadrant, 4 x 5 cm in anterior axillary line. 11/10/2010    Past Surgical History:  Procedure Laterality Date  . APPENDECTOMY    . BACK SURGERY  1988   PT STATES AFTER THE BACK SURGERY PT HAS PARALYSIS LEFT ARM AND LEG--TOOK 2&1/2 YEARS TO REGAIN USE OF LEFT SIDE--LEFT SIDE REMAINS WEAKER THAN HER RIFHT SIDE.  Marland Kitchen CARDIAC CATHETERIZATION  2012  . EYE SURGERY     left eye, lens replacement  . MASS EXCISION  01/13/2011   Procedure: EXCISION MASS;  Surgeon: Stark Klein, MD;  Location: [place];  Service: General;  Laterality: Left;  Left abdominal wall mass (subcutaneous, 4-5 cm).  . THYROIDECTOMY  08/14/2011   Procedure: THYROIDECTOMY;  Surgeon: Earnstine Regal, MD;  [facility];  Service: General;  Laterality: N/A;  Total Thyroidectomy  . VASCULAR SURGERY     STENT PLACED 03/2010 RT LEG     OB History   No obstetric history on file.  Family History  Problem Relation Age of Onset  . Cancer Mother        brain  . Stroke Mother   . Heart disease Father   . Heart attack Father   . Hypertension Father   . Cancer Brother        prostate  . Anesthesia problems Neg Hx   . Hypotension Neg Hx   . Malignant hyperthermia Neg Hx   . Pseudochol deficiency Neg Hx   . Breast cancer Neg Hx     Social History   Tobacco Use  . Smoking status: Never Smoker  . Smokeless tobacco: Never Used  Vaping Use  . Vaping Use: Never used  Substance Use Topics  . Alcohol use: No  . Drug use: No    Home Medications Prior to Admission medications   Medication Sig Start Date End Date Taking? Authorizing Provider  amLODipine (NORVASC) 10 MG tablet Take 1 tablet (10 mg total) by mouth daily. 09/20/18  Yes Rai, Ripudeep K, MD  aspirin EC 81 MG tablet Take 1 tablet (81 mg total) by mouth daily. 06/13/19  Yes Wellington Hampshire,  MD  BIOTIN PO Take 1 tablet by mouth daily.   Yes [provider]  COMBIGAN 0.2-0.5 % ophthalmic solution Place 1 drop into the right eye 2 (two) times daily. 02/12/17  Yes [provider]  levothyroxine (SYNTHROID, LEVOTHROID) 88 MCG tablet Take 88 mcg by mouth daily before breakfast.   Yes [provider]  LUMIGAN 0.01 % SOLN Place 1 drop into the right eye at bedtime. 02/12/17  Yes [provider]  Multiple Vitamins-Minerals (CENTRUM SILVER 50+WOMEN) TABS Take 1 tablet by mouth daily.   Yes [provider]  Netarsudil Dimesylate (RHOPRESSA OP) Place 1 drop into the right eye at bedtime.   Yes [provider]  nitroGLYCERIN (NITROSTAT) 0.4 MG SL tablet Place 1 tablet (0.4 mg total) under the tongue every 5 (five) minutes as needed. For chest pain. 04/21/18  Yes Jettie Booze, MD  ondansetron (ZOFRAN ODT) 4 MG disintegrating tablet Take 1 tablet (4 mg total) by mouth every 8 (eight) hours as needed for nausea or vomiting. 09/20/18  Yes Rai, Ripudeep K, MD  polyethylene glycol (MIRALAX / GLYCOLAX) 17 g packet Take 17 g by mouth daily as needed for moderate constipation. Also available OTC 09/20/18  Yes Rai, Ripudeep K, MD  Propylene Glycol (SYSTANE BALANCE) 0.6 % SOLN Apply 1 drop to eye 2 (two) times daily as needed (dry eyes).   Yes [provider]  vitamin B-12 (CYANOCOBALAMIN) 100 MCG tablet Take 100 mcg by mouth daily.   Yes [provider]  Bempedoic Acid (NEXLETOL) 180 MG TABS Take 1 tablet by mouth daily. 05/29/19   Jettie Booze, MD  diclofenac Sodium (VOLTAREN) 1 % GEL Apply 2 g topically 4 (four) times daily as needed. Patient taking differently: Apply 2 g topically 2 (two) times daily as needed.  05/13/19   Long, Wonda Olds, MD  docusate sodium (COLACE) 100 MG capsule Take 1 capsule (100 mg total) by mouth 2 (two) times daily. Also available over the counter Patient taking differently: Take 100 mg by mouth daily as  needed for mild constipation.  09/20/18   Rai, Vernelle Emerald, MD  gabapentin (NEURONTIN) 300 MG capsule Take 1 capsule (300 mg total) by mouth 3 (three) times daily. Patient taking differently: Take 300 mg by mouth daily.  09/20/18   Mendel Corning, MD    Allergies  Crestor [rosuvastatin], Eggs or egg-derived products, Lemon juice, Milk-related compounds, Repatha [evolocumab], and Shellfish allergy  Review of Systems   Review of Systems  Cardiovascular: Positive for chest pain.  Neurological: Positive for dizziness.  All other systems reviewed and are negative.   Physical Exam Updated Vital Signs BP 135/70   Pulse (!) 57   Temp 98.5 F (36.9 C) (Oral)   Resp 13   Ht 5\' 5"  (1.651 m)   Wt 92.5 kg   SpO2 97%   BMI 33.95 kg/m   Physical Exam Vitals and nursing note reviewed.  Constitutional:      Appearance: She is well-developed.  HENT:     Head: Normocephalic and atraumatic.  Eyes:     Extraocular Movements: Extraocular movements intact.     Pupils: Pupils are equal, round, and reactive to light.  Cardiovascular:     Rate and Rhythm: Normal rate and regular rhythm.     Heart sounds: Normal heart sounds.  Pulmonary:     Effort: Pulmonary effort is normal.     Breath sounds: Normal breath sounds.  Abdominal:     General: Bowel sounds are normal.     Palpations: Abdomen is soft.  Musculoskeletal:        General: Normal range of motion.     Cervical back: Normal range of motion and neck supple.  Skin:    General: Skin is warm.     Capillary Refill: Capillary refill takes less than 2 seconds.  Neurological:     General: No focal deficit present.     Mental Status: She is alert and oriented to person, place, and time.  Psychiatric:        Mood and Affect: Mood normal.        Behavior: Behavior normal.     ED Results / Procedures / Treatments   Labs (all labs ordered are listed, but only abnormal results are displayed) Labs Reviewed  BASIC METABOLIC PANEL -  Abnormal; Notable for the following components:      Result Value   Calcium 8.7 (*)    GFR calc non Af Amer 54 (*)    All other components within normal limits  CBC - Abnormal; Notable for the following components:   RDW 15.6 (*)    All other components within normal limits  CK - Abnormal; Notable for the following components:   Total CK 411 (*)    All other components within normal limits  D-DIMER, QUANTITATIVE (NOT AT [facility]) - Abnormal; Notable for the following components:   D-Dimer, Quant 3.40 (*)    All other components within normal limits  CK - Abnormal; Notable for the following components:   Total CK 396 (*)    All other components within normal limits  HEPATIC FUNCTION PANEL  TSH  TROPONIN I (HIGH SENSITIVITY)  TROPONIN I (HIGH SENSITIVITY)    EKG EKG Interpretation  Date/Time:  Thursday July 06 2019 15:04:12 EDT Ventricular Rate:  69 PR Interval:    QRS Duration: 91 QT Interval:  434 QTC Calculation: 465 R Axis:   9 Text Interpretation: Sinus rhythm Low voltage, precordial leads Baseline wander in lead(s) V1 12 Lead; Mason-Likar No significant change since last tracing Confirmed by Isla Pence [phone]) on 07/06/2019 5:00:55 PM   Radiology DG Chest 2 View  Result Date: 07/06/2019 CLINICAL DATA:  Chest pain, dizziness, imbalance, weakness, and itching for 3-4 days, hypertension EXAM: CHEST - 2 VIEW COMPARISON:  01/20/2015 FINDINGS: Eventration RIGHT diaphragm again seen. Normal  heart size, mediastinal contours, and pulmonary vascularity. Atherosclerotic calcification aorta. Minimal bibasilar atelectasis. Lungs otherwise clear. No acute infiltrate, pleural effusion or pneumothorax. Bones demineralized. IMPRESSION: Minimal bibasilar atelectasis without acute infiltrate. Electronically Signed   By: Lavonia Dana M.D.   On: 07/06/2019 16:38   CT Angio Chest PE W and/or Wo Contrast  Result Date: 07/06/2019 CLINICAL DATA:  Chest pain, elevated D-dimer EXAM: CT ANGIOGRAPHY  CHEST WITH CONTRAST TECHNIQUE: Multidetector CT imaging of the chest was performed using the standard protocol during bolus administration of intravenous contrast. Multiplanar CT image reconstructions and MIPs were obtained to evaluate the vascular anatomy. CONTRAST:  170mL OMNIPAQUE IOHEXOL 350 MG/ML SOLN COMPARISON:  None. FINDINGS: Cardiovascular: No filling defects in the pulmonary arteries to suggest pulmonary emboli. Heart is mildly enlarged. Aortic and coronary artery calcifications. No aneurysm. Mediastinum/Nodes: No mediastinal, hilar, or axillary adenopathy. Trachea and esophagus are unremarkable. Lungs/Pleura: Small calcified granuloma peripherally in the left upper lobe. Eventration of the right hemidiaphragm. Bibasilar atelectasis. No effusions. No suspicious pulmonary nodules. Upper Abdomen: Scattered low-density lesions in the liver and spleen are stable since prior study, likely cysts. No acute findings. Musculoskeletal: No acute bony abnormality. Review of the MIP images confirms the above findings. IMPRESSION: No evidence of pulmonary embolus. Stable eventration of the right hemidiaphragm. Bibasilar atelectasis. No acute cardiopulmonary disease. Coronary artery disease. Aortic Atherosclerosis (ICD10-I70.0). Electronically Signed   By: Rolm Baptise M.D.   On: 07/06/2019 19:17    Procedures Procedures (including critical care time)  Medications Ordered in ED Medications  sodium chloride flush (NS) 0.9 % injection 3 mL (3 mLs Intravenous Given 07/06/19 1726)  sodium chloride 0.9 % bolus 1,000 mL (0 mLs Intravenous Stopped 07/06/19 1820)  iohexol (OMNIPAQUE) 350 MG/ML injection 100 mL (100 mLs Intravenous Contrast Given 07/06/19 1850)    ED Course  I have reviewed the triage vital signs and the nursing notes.  Pertinent labs & imaging results that were available during my care of the patient were reviewed by me and considered in my medical decision making (see chart for details).    MDM  Rules/Calculators/A&P                          Pt is feeling much better after the fluids.  She has multiple risk factors for CAD, but her sx don't sound cardiac. Pt's CK is slightly elevated, but is coming down with fluids.  I don't think she has a post vaccination myocarditis as her troponin is negative.  Pt is stable for d/c.  Return if worse.   Final Clinical Impression(s) / ED Diagnoses Final diagnoses:  Atypical chest pain  Elevated creatine kinase    Rx / DC Orders ED Discharge Orders    None       Isla Pence, MD 07/06/19 2042

## 2019-07-06 NOTE — ED Triage Notes (Signed)
Patient c/o mid and left chest pain x 2-3 days. Patient states the chest pain has been constant today. Patient states she took NTG  4 tabs before she could get chest pain to subside. Patient also c/o fatigue and dizziness.

## 2019-07-07 DIAGNOSIS — Z01419 Encounter for gynecological examination (general) (routine) without abnormal findings: Secondary | ICD-10-CM | POA: Diagnosis not present

## 2019-07-23 DIAGNOSIS — S14125A Central cord syndrome at C5 level of cervical spinal cord, initial encounter: Secondary | ICD-10-CM | POA: Diagnosis not present

## 2019-08-16 DIAGNOSIS — H04123 Dry eye syndrome of bilateral lacrimal glands: Secondary | ICD-10-CM | POA: Diagnosis not present

## 2019-08-16 DIAGNOSIS — H401122 Primary open-angle glaucoma, left eye, moderate stage: Secondary | ICD-10-CM | POA: Diagnosis not present

## 2019-08-16 DIAGNOSIS — H401113 Primary open-angle glaucoma, right eye, severe stage: Secondary | ICD-10-CM | POA: Diagnosis not present

## 2019-08-16 DIAGNOSIS — Z961 Presence of intraocular lens: Secondary | ICD-10-CM | POA: Diagnosis not present

## 2019-08-21 ENCOUNTER — Telehealth: Payer: Self-pay

## 2019-08-21 DIAGNOSIS — E782 Mixed hyperlipidemia: Secondary | ICD-10-CM

## 2019-08-21 NOTE — Telephone Encounter (Signed)
Called and spoke w/pt and scheduled labs

## 2019-08-21 NOTE — Telephone Encounter (Signed)
-----   Message from Ramond Dial, [place] sent at 08/21/2019  7:16 AM EDT -----  ----- Message ----- From: Marcelle Overlie D, RPH-CPP Sent: 08/21/2019 To: Ramond Dial, RPH-CPP  Set up lipid labs- started nexletol

## 2019-08-23 DIAGNOSIS — S14125A Central cord syndrome at C5 level of cervical spinal cord, initial encounter: Secondary | ICD-10-CM | POA: Diagnosis not present

## 2019-08-28 ENCOUNTER — Other Ambulatory Visit: Payer: Self-pay

## 2019-08-28 ENCOUNTER — Other Ambulatory Visit: Payer: PPO

## 2019-08-28 DIAGNOSIS — E782 Mixed hyperlipidemia: Secondary | ICD-10-CM | POA: Diagnosis not present

## 2019-08-28 LAB — LIPID PANEL
Chol/HDL Ratio: 4.7 ratio — ABNORMAL HIGH (ref 0.0–4.4)
Cholesterol, Total: 263 mg/dL — ABNORMAL HIGH (ref 100–199)
HDL: 56 mg/dL (ref >39)
LDL Chol Calc (NIH): 191 mg/dL — ABNORMAL HIGH (ref 0–99)
Triglycerides: 94 mg/dL (ref 0–149)
VLDL Cholesterol Cal: 16 mg/dL (ref 5–40)

## 2019-08-29 ENCOUNTER — Telehealth: Payer: Self-pay | Admitting: Pharmacist

## 2019-08-29 NOTE — Telephone Encounter (Signed)
LDL has worsened. Called patient. She is not taking the Nexletol. Yvonne Tran it made her sight nutty. However when asked if it improved when she stopped, she said not really. Patient willing to retry for another month. Will call her at the end of sept.

## 2019-09-01 DIAGNOSIS — I201 Angina pectoris with documented spasm: Secondary | ICD-10-CM | POA: Diagnosis not present

## 2019-09-01 DIAGNOSIS — I25119 Atherosclerotic heart disease of native coronary artery with unspecified angina pectoris: Secondary | ICD-10-CM | POA: Diagnosis not present

## 2019-09-01 DIAGNOSIS — E785 Hyperlipidemia, unspecified: Secondary | ICD-10-CM | POA: Diagnosis not present

## 2019-09-01 DIAGNOSIS — H409 Unspecified glaucoma: Secondary | ICD-10-CM | POA: Diagnosis not present

## 2019-09-01 DIAGNOSIS — I10 Essential (primary) hypertension: Secondary | ICD-10-CM | POA: Diagnosis not present

## 2019-09-01 DIAGNOSIS — E039 Hypothyroidism, unspecified: Secondary | ICD-10-CM | POA: Diagnosis not present

## 2019-09-19 ENCOUNTER — Other Ambulatory Visit: Payer: Self-pay

## 2019-09-19 ENCOUNTER — Ambulatory Visit (INDEPENDENT_AMBULATORY_CARE_PROVIDER_SITE_OTHER): Payer: PPO | Admitting: Cardiovascular Disease

## 2019-09-19 ENCOUNTER — Encounter: Payer: Self-pay | Admitting: Cardiovascular Disease

## 2019-09-19 VITALS — BP 122/70 | HR 61 | Ht 65.0 in | Wt 204.0 lb

## 2019-09-19 DIAGNOSIS — I10 Essential (primary) hypertension: Secondary | ICD-10-CM | POA: Diagnosis not present

## 2019-09-19 DIAGNOSIS — I739 Peripheral vascular disease, unspecified: Secondary | ICD-10-CM

## 2019-09-19 DIAGNOSIS — E785 Hyperlipidemia, unspecified: Secondary | ICD-10-CM | POA: Diagnosis not present

## 2019-09-19 MED ORDER — EZETIMIBE 10 MG PO TABS: 10.0000 mg | ORAL_TABLET | Freq: Every day | ORAL | 3 refills | Status: DC

## 2019-09-19 MED ORDER — NITROGLYCERIN 0.4 MG SL SUBL: 0.4000 mg | SUBLINGUAL_TABLET | SUBLINGUAL | 3 refills | Status: AC | PRN

## 2019-09-19 NOTE — Progress Notes (Signed)
Cardiology Office Note   Date:  09/19/2019   ID:  Yvonne Tran, DOB 03/30/1934, MRN [id number]  PCP:  Cari Caraway, MD  Cardiologist:  Dr. Irish Lack  No chief complaint on file.     History of Present Illness: Yvonne Tran is a 85 y.o. female who is here for a follow-up visit regarding peripheral arterial disease. She had previous left iliac stenting twice most recently in 2012 for in-stent restenosis. She has known history of recurrent chest pain with no evidence of significant coronary artery disease on previous cardiac catheterization in 2009 and 2012. She is not diabetic and does not smoke. She does have known history of hypertension. She reports having back surgery done in 1986 after a spine fracture. She had very slow recovery at that time and never regained her functional capacity that she had before the event.  She has chronic lower back and bilateral leg pain at rest and with activities that is thought to be multifactorial due to spine disease as well as possible mild claudication.  She was hospitalized in September of 2020 after a fall and was found to have C5-C6 spinal stenosis.  She was also diagnosed subsequently with left lower extremity DVT and was placed on anticoagulation.  She finished treatment with Xarelto.  Most recent vascular studies in May of this year showed an ABI of 0.97 on the right and 0.94 on the left with abnormal toe pressure.  Duplex showed significant right common iliac artery stenosis with peak velocity of 421.  There was also significant stenosis in the left external iliac artery with velocity close to 400.  It was difficult to determine how much claudication she was having versus spine related issues and functional capacity is overall low.  She walks slowly with a walker.  She has known history of hyperlipidemia with intolerance to statins.  She was started on Nexletol but stopped taking the medication due to some blurred vision  in the left eye.  Blurred vision did not improved after she stopped the medication but she has been hesitant to go back on it.     Past Medical History:  Diagnosis Date  . Chest pain with normal coronary angiography 2009, 2012  . DVT (deep venous thrombosis) ([place])    a. LLE 09/2018 following admission for fall.  . Gait instability   . Glaucoma    GLAUCOMA IN   BOTH EYES:  LEGALLY BLIND IN RIGHT EYE  . Goiter    THROID GOITER WITH COMPRESSION--PT HAVING TROUBLE SWALLOWING AND SOB  . Headache(784.0)    LEFT TEMPORAL AREA  . Hyperlipidemia   . Hypertension   . Lipoma   . Orthostasis   . PAD (peripheral artery disease) ([place])   . Pain    RIGHT THIGH FOR QUITE SOME TIME--LEG GIVES AWAY SOMETIMES.  LOWER BACK PAIN  - S/P HX OF PREVIOUS BACK SURGERY  . Palpitations    a. event monitor 02/2019 NSR with rare PACs, PVCs.  . Peripheral vascular disease ([place])    09/2008 STENT LEFT EXTERNAL ILIAC;  03/20/10 RE-STENT OF LEFT EXTERNAL ILIAC STENT- AT [facility] WITH DR. VARANASI  . Pulmonary hypertension ([place])   . Pulsatile tinnitus   . Stroke [facility])    MINI STROKE  YRS AGO     Past Surgical History:  Procedure Laterality Date  . APPENDECTOMY    . BACK SURGERY  1988   PT STATES AFTER THE BACK SURGERY PT HAS PARALYSIS LEFT ARM AND LEG--TOOK 2&1/2  YEARS TO REGAIN USE OF LEFT SIDE--LEFT SIDE REMAINS WEAKER THAN HER RIFHT SIDE.  Marland Kitchen CARDIAC CATHETERIZATION  2012  . EYE SURGERY     left eye, lens replacement  . MASS EXCISION  01/13/2011   Procedure: EXCISION MASS;  Surgeon: Stark Klein, MD;  Location: [facility];  Service: General;  Laterality: Left;  Left abdominal wall mass (subcutaneous, 4-5 cm).  . THYROIDECTOMY  08/14/2011   Procedure: THYROIDECTOMY;  Surgeon: Earnstine Regal, MD;  Location: [facility];  Service: General;  Laterality: N/A;  Total Thyroidectomy  . VASCULAR SURGERY     STENT PLACED 03/2010 RT LEG     Current Outpatient Medications  Medication Sig Dispense Refill  . amLODipine (NORVASC) 10 MG  tablet Take 1 tablet (10 mg total) by mouth daily. 30 tablet 3  . aspirin EC 81 MG tablet Take 1 tablet (81 mg total) by mouth daily. 90 tablet 3  . Bempedoic Acid (NEXLETOL) 180 MG TABS Take 1 tablet by mouth daily. 30 tablet 11  . BIOTIN PO Take 1 tablet by mouth daily.    . COMBIGAN 0.2-0.5 % ophthalmic solution Place 1 drop into the right eye 2 (two) times daily.    . diclofenac Sodium (VOLTAREN) 1 % GEL Apply 2 g topically 4 (four) times daily as needed. (Patient taking differently: Apply 2 g topically 2 (two) times daily as needed. ) 50 g 0  . docusate sodium (COLACE) 100 MG capsule Take 1 capsule (100 mg total) by mouth 2 (two) times daily. Also available over the counter (Patient taking differently: Take 100 mg by mouth daily as needed for mild constipation. ) 60 capsule 3  . gabapentin (NEURONTIN) 300 MG capsule Take 1 capsule (300 mg total) by mouth 3 (three) times daily. (Patient taking differently: Take 300 mg by mouth daily. ) 90 capsule 2  . levothyroxine (SYNTHROID, LEVOTHROID) 88 MCG tablet Take 88 mcg by mouth daily before breakfast.    . LUMIGAN 0.01 % SOLN Place 1 drop into the right eye at bedtime.    . Multiple Vitamins-Minerals (CENTRUM SILVER 50+WOMEN) TABS Take 1 tablet by mouth daily.    Mckinley Jewel Dimesylate (RHOPRESSA OP) Place 1 drop into the right eye at bedtime.    . nitroGLYCERIN (NITROSTAT) 0.4 MG SL tablet Place 1 tablet (0.4 mg total) under the tongue every 5 (five) minutes as needed. For chest pain. 25 tablet 3  . ondansetron (ZOFRAN ODT) 4 MG disintegrating tablet Take 1 tablet (4 mg total) by mouth every 8 (eight) hours as needed for nausea or vomiting. 20 tablet 0  . polyethylene glycol (MIRALAX / GLYCOLAX) 17 g packet Take 17 g by mouth daily as needed for moderate constipation. Also available OTC 30 each 0  . Propylene Glycol (SYSTANE BALANCE) 0.6 % SOLN Apply 1 drop to eye 2 (two) times daily as needed (dry eyes).    . vitamin B-12 (CYANOCOBALAMIN) 100 MCG  tablet Take 100 mcg by mouth daily.    Marland Kitchen ezetimibe (ZETIA) 10 MG tablet Take 1 tablet (10 mg total) by mouth daily. 90 tablet 3   No current facility-administered medications for this visit.    Allergies:   Crestor [rosuvastatin], Eggs or egg-derived products, Lemon juice, Milk-related compounds, Repatha [evolocumab], and Shellfish allergy    Social History:  The patient  reports that she has never smoked. She has never used smokeless tobacco. She reports that she does not drink alcohol and does not use drugs.   Family History:  The patient's family history includes Cancer in her brother and mother; Heart attack in her father; Heart disease in her father; Hypertension in her father; Stroke in her mother.    ROS:  Please see the history of present illness.   Otherwise, review of systems are positive for none.   All other systems are reviewed and negative.    PHYSICAL EXAM: VS:  BP 122/70   Pulse 61   Ht 5\' 5"  (1.651 m)   Wt 204 lb (92.5 kg)   SpO2 98%   BMI 33.95 kg/m  , BMI Body mass index is 33.95 kg/m. GEN: Well nourished, well developed, in no acute distress  HEENT: normal  Neck: no JVD, carotid bruits, or masses Cardiac: RRR; no murmurs, rubs, or gallops,no edema  Respiratory:  clear to auscultation bilaterally, normal work of breathing GI: soft, nontender, nondistended, + BS MS: no deformity or atrophy  Skin: warm and dry, no rash Neuro:  Strength and sensation are intact Psych: euthymic mood, full affect Vascular: Femoral pulses are +1 bilaterally. Distal pulses are faint on the left.  Could not palpate on the right.  EKG:  EKG is not ordered today.    Recent Labs: 02/10/2019: Magnesium 2.2; TSH 2.010 07/06/2019: ALT 18; BUN 13; Creatinine, Ser 0.98; Hemoglobin 12.6; Platelets 241; Potassium 4.0; Sodium 141    Lipid Panel    Component Value Date/Time   CHOL 263 (H) 08/28/2019 0825   TRIG 94 08/28/2019 0825   HDL 56 08/28/2019 0825   CHOLHDL 4.7 (H) 08/28/2019  0825   CHOLHDL 4 05/15/2014 0947   VLDL 13.6 05/15/2014 0947   LDLCALC 191 (H) 08/28/2019 0825   LDLDIRECT 196.5 12/12/2012 1104      Wt Readings from Last 3 Encounters:  09/19/19 204 lb (92.5 kg)  07/06/19 204 lb (92.5 kg)  06/13/19 208 lb 9.6 oz (94.6 kg)         ASSESSMENT AND PLAN:  1.  Peripheral arterial disease: The patient has significant iliac disease bilaterally.  However, her symptoms seems to be multifactorial due to spine disease as well as peripheral arterial disease.   Her functional capacity is reduced and she walks slowly with a walker.  Even with iliac artery revascularization, I doubt that she will have a great response.  Continue medical therapy for now and reserve angiography and revascularization for worsening symptoms.  2. Hyperlipidemia: Unfortunately, she is intolerant to statins.  She stopped taking Bempedioc acid due to blurred vision in the left eye.  I explained to her that this was likely a coincidence and not related to the medication but still she is hesitant to resume.  I elected to add Zetia 10 mg daily.  3. Essential hypertension blood pressure is reasonably controlled on current medications.   Disposition:   FU with me in 6 months.   Signed,  Kathlyn Sacramento, MD  09/19/2019 1:03 PM    [facility]

## 2019-09-19 NOTE — Patient Instructions (Signed)
Medication Instructions:  START Ezetimibe 10 mg once daily  *If you need a refill on your cardiac medications before your next appointment, please call your pharmacy*   Lab Work: Your provider would like for you to return in 2 months to have the following labs drawn: Liver and Lipid. You do not need an appointment for the lab. Once in our office lobby there is a podium where you can sign in and ring the doorbell to alert [place] that you are here. The lab is open from 8:00 am to 4:30 pm; closed for lunch from 12:45pm-1:45pm.  If you have labs (blood work) drawn today and your tests are completely normal, you will receive your results only by: Marland Kitchen MyChart Message (if you have MyChart) OR . A paper copy in the mail If you have any lab test that is abnormal or we need to change your treatment, we will call you to review the results.   Testing/Procedures: None ordered   Follow-Up: At [facility], you and your health needs are our priority.  As part of our continuing mission to provide you with exceptional heart care, we have created designated Provider Care Teams.  These Care Teams include your primary Cardiologist (physician) and Advanced Practice Providers (APPs -  Physician Assistants and Nurse Practitioners) who all work together to provide you with the care you need, when you need it.  We recommend signing up for the patient portal called "MyChart".  Sign up information is provided on this After Visit Summary.  MyChart is used to connect with patients for Virtual Visits (Telemedicine).  Patients are able to view lab/test results, encounter notes, upcoming appointments, etc.  Non-urgent messages can be sent to your provider as well.   To learn more about what you can do with MyChart, go to [email or web address].    Your next appointment:   6 month(s)  The format for your next appointment:   In Person  Provider:   Kathlyn Sacramento, MD

## 2019-09-23 DIAGNOSIS — S14125A Central cord syndrome at C5 level of cervical spinal cord, initial encounter: Secondary | ICD-10-CM | POA: Diagnosis not present

## 2019-10-02 ENCOUNTER — Telehealth: Payer: Self-pay | Admitting: Pharmacist

## 2019-10-02 NOTE — Telephone Encounter (Signed)
Per Dr. Fletcher Anon note, patient did not resume Nexletol despite being advised that her vision issues were not from medication. Instead Dr. Fletcher Anon has started zetia. I will call pt in 2 months to set up labs.

## 2019-10-05 DIAGNOSIS — E785 Hyperlipidemia, unspecified: Secondary | ICD-10-CM | POA: Diagnosis not present

## 2019-10-05 DIAGNOSIS — E039 Hypothyroidism, unspecified: Secondary | ICD-10-CM | POA: Diagnosis not present

## 2019-10-05 DIAGNOSIS — I25119 Atherosclerotic heart disease of native coronary artery with unspecified angina pectoris: Secondary | ICD-10-CM | POA: Diagnosis not present

## 2019-10-05 DIAGNOSIS — I201 Angina pectoris with documented spasm: Secondary | ICD-10-CM | POA: Diagnosis not present

## 2019-10-05 DIAGNOSIS — I10 Essential (primary) hypertension: Secondary | ICD-10-CM | POA: Diagnosis not present

## 2019-10-05 DIAGNOSIS — H409 Unspecified glaucoma: Secondary | ICD-10-CM | POA: Diagnosis not present

## 2019-10-10 DIAGNOSIS — I25119 Atherosclerotic heart disease of native coronary artery with unspecified angina pectoris: Secondary | ICD-10-CM | POA: Diagnosis not present

## 2019-10-10 DIAGNOSIS — E785 Hyperlipidemia, unspecified: Secondary | ICD-10-CM | POA: Diagnosis not present

## 2019-10-10 DIAGNOSIS — H409 Unspecified glaucoma: Secondary | ICD-10-CM | POA: Diagnosis not present

## 2019-10-10 DIAGNOSIS — I10 Essential (primary) hypertension: Secondary | ICD-10-CM | POA: Diagnosis not present

## 2019-10-10 DIAGNOSIS — E039 Hypothyroidism, unspecified: Secondary | ICD-10-CM | POA: Diagnosis not present

## 2019-10-10 DIAGNOSIS — I201 Angina pectoris with documented spasm: Secondary | ICD-10-CM | POA: Diagnosis not present

## 2019-10-23 DIAGNOSIS — S14125A Central cord syndrome at C5 level of cervical spinal cord, initial encounter: Secondary | ICD-10-CM | POA: Diagnosis not present

## 2019-10-26 DIAGNOSIS — H579 Unspecified disorder of eye and adnexa: Secondary | ICD-10-CM | POA: Diagnosis not present

## 2019-10-28 DIAGNOSIS — Z961 Presence of intraocular lens: Secondary | ICD-10-CM | POA: Diagnosis not present

## 2019-10-28 DIAGNOSIS — H04321 Acute dacryocystitis of right lacrimal passage: Secondary | ICD-10-CM | POA: Diagnosis not present

## 2019-10-28 DIAGNOSIS — H401133 Primary open-angle glaucoma, bilateral, severe stage: Secondary | ICD-10-CM | POA: Diagnosis not present

## 2019-11-13 DIAGNOSIS — M48062 Spinal stenosis, lumbar region with neurogenic claudication: Secondary | ICD-10-CM | POA: Diagnosis not present

## 2019-11-13 DIAGNOSIS — E559 Vitamin D deficiency, unspecified: Secondary | ICD-10-CM | POA: Diagnosis not present

## 2019-11-13 DIAGNOSIS — E039 Hypothyroidism, unspecified: Secondary | ICD-10-CM | POA: Diagnosis not present

## 2019-11-13 DIAGNOSIS — I10 Essential (primary) hypertension: Secondary | ICD-10-CM | POA: Diagnosis not present

## 2019-11-13 DIAGNOSIS — I739 Peripheral vascular disease, unspecified: Secondary | ICD-10-CM | POA: Diagnosis not present

## 2019-11-13 DIAGNOSIS — M4802 Spinal stenosis, cervical region: Secondary | ICD-10-CM | POA: Diagnosis not present

## 2019-11-13 DIAGNOSIS — I25119 Atherosclerotic heart disease of native coronary artery with unspecified angina pectoris: Secondary | ICD-10-CM | POA: Diagnosis not present

## 2019-11-13 DIAGNOSIS — S14125S Central cord syndrome at C5 level of cervical spinal cord, sequela: Secondary | ICD-10-CM | POA: Diagnosis not present

## 2019-11-13 DIAGNOSIS — Z86718 Personal history of other venous thrombosis and embolism: Secondary | ICD-10-CM | POA: Diagnosis not present

## 2019-11-24 DIAGNOSIS — Z86718 Personal history of other venous thrombosis and embolism: Secondary | ICD-10-CM | POA: Diagnosis not present

## 2019-11-24 DIAGNOSIS — I10 Essential (primary) hypertension: Secondary | ICD-10-CM | POA: Diagnosis not present

## 2019-11-24 DIAGNOSIS — E559 Vitamin D deficiency, unspecified: Secondary | ICD-10-CM | POA: Diagnosis not present

## 2019-11-24 DIAGNOSIS — M48062 Spinal stenosis, lumbar region with neurogenic claudication: Secondary | ICD-10-CM | POA: Diagnosis not present

## 2019-11-24 DIAGNOSIS — S14125S Central cord syndrome at C5 level of cervical spinal cord, sequela: Secondary | ICD-10-CM | POA: Diagnosis not present

## 2019-11-24 DIAGNOSIS — E039 Hypothyroidism, unspecified: Secondary | ICD-10-CM | POA: Diagnosis not present

## 2019-11-24 DIAGNOSIS — M4802 Spinal stenosis, cervical region: Secondary | ICD-10-CM | POA: Diagnosis not present

## 2019-11-24 DIAGNOSIS — I739 Peripheral vascular disease, unspecified: Secondary | ICD-10-CM | POA: Diagnosis not present

## 2019-11-24 DIAGNOSIS — I25119 Atherosclerotic heart disease of native coronary artery with unspecified angina pectoris: Secondary | ICD-10-CM | POA: Diagnosis not present

## 2019-11-24 DIAGNOSIS — E785 Hyperlipidemia, unspecified: Secondary | ICD-10-CM | POA: Diagnosis not present

## 2019-11-24 DIAGNOSIS — H409 Unspecified glaucoma: Secondary | ICD-10-CM | POA: Diagnosis not present

## 2019-11-29 ENCOUNTER — Telehealth: Payer: Self-pay

## 2019-11-29 NOTE — Telephone Encounter (Signed)
Pt stated that they are taking zetia and will come by the [place] office fasting asap

## 2019-11-29 NOTE — Telephone Encounter (Signed)
-----   Message from Ramond Dial, [place] sent at 11/29/2019 10:57 AM EST ----- Please see if she is taking zetia. If she is then set up labs ----- Message ----- From: Ramond Dial, RPH-CPP Sent: 11/27/2019 To: Ramond Dial, RPH-CPP  Call pt and see if she is taking zetia- set up labs ----- Message ----- From: Ramond Dial, RPH-CPP Sent: 10/02/2019 To: Ramond Dial, RPH-CPP  Call pt and see if she is still taking nexletol ----- Message ----- From: Ramond Dial, RPH-CPP Sent: 08/29/2019 To: Ramond Dial, RPH-CPP  lipids ----- Message ----- From: Ramond Dial, RPH-CPP Sent: 08/21/2019 To: Ramond Dial, RPH-CPP  Set up lipid labs- started nexletol

## 2019-12-04 DIAGNOSIS — I201 Angina pectoris with documented spasm: Secondary | ICD-10-CM | POA: Diagnosis not present

## 2019-12-04 DIAGNOSIS — E039 Hypothyroidism, unspecified: Secondary | ICD-10-CM | POA: Diagnosis not present

## 2019-12-04 DIAGNOSIS — G8929 Other chronic pain: Secondary | ICD-10-CM | POA: Diagnosis not present

## 2019-12-04 DIAGNOSIS — H409 Unspecified glaucoma: Secondary | ICD-10-CM | POA: Diagnosis not present

## 2019-12-04 DIAGNOSIS — I10 Essential (primary) hypertension: Secondary | ICD-10-CM | POA: Diagnosis not present

## 2019-12-04 DIAGNOSIS — I25119 Atherosclerotic heart disease of native coronary artery with unspecified angina pectoris: Secondary | ICD-10-CM | POA: Diagnosis not present

## 2019-12-04 DIAGNOSIS — E785 Hyperlipidemia, unspecified: Secondary | ICD-10-CM | POA: Diagnosis not present

## 2019-12-06 DIAGNOSIS — H409 Unspecified glaucoma: Secondary | ICD-10-CM | POA: Diagnosis not present

## 2019-12-06 DIAGNOSIS — E785 Hyperlipidemia, unspecified: Secondary | ICD-10-CM | POA: Diagnosis not present

## 2019-12-06 DIAGNOSIS — G8929 Other chronic pain: Secondary | ICD-10-CM | POA: Diagnosis not present

## 2019-12-06 DIAGNOSIS — E039 Hypothyroidism, unspecified: Secondary | ICD-10-CM | POA: Diagnosis not present

## 2019-12-06 DIAGNOSIS — I10 Essential (primary) hypertension: Secondary | ICD-10-CM | POA: Diagnosis not present

## 2019-12-06 DIAGNOSIS — I25119 Atherosclerotic heart disease of native coronary artery with unspecified angina pectoris: Secondary | ICD-10-CM | POA: Diagnosis not present

## 2019-12-06 DIAGNOSIS — I201 Angina pectoris with documented spasm: Secondary | ICD-10-CM | POA: Diagnosis not present

## 2019-12-14 DIAGNOSIS — I739 Peripheral vascular disease, unspecified: Secondary | ICD-10-CM | POA: Diagnosis not present

## 2019-12-14 DIAGNOSIS — E785 Hyperlipidemia, unspecified: Secondary | ICD-10-CM | POA: Diagnosis not present

## 2019-12-15 LAB — LIPID PANEL
Chol/HDL Ratio: 4.2 ratio (ref 0.0–4.4)
Cholesterol, Total: 251 mg/dL — ABNORMAL HIGH (ref 100–199)
HDL: 60 mg/dL (ref >39)
LDL Chol Calc (NIH): 180 mg/dL — ABNORMAL HIGH (ref 0–99)
Triglycerides: 65 mg/dL (ref 0–149)
VLDL Cholesterol Cal: 11 mg/dL (ref 5–40)

## 2019-12-15 LAB — HEPATIC FUNCTION PANEL
ALT: 14 IU/L (ref 0–32)
AST: 26 IU/L (ref 0–40)
Albumin: 4.2 g/dL (ref 3.6–4.6)
Alkaline Phosphatase: 87 IU/L (ref 44–121)
Bilirubin Total: 0.2 mg/dL (ref 0.0–1.2)
Bilirubin, Direct: <0.1 mg/dL (ref 0.00–0.40)
Total Protein: 7 g/dL (ref 6.0–8.5)

## 2019-12-19 DIAGNOSIS — H401113 Primary open-angle glaucoma, right eye, severe stage: Secondary | ICD-10-CM | POA: Diagnosis not present

## 2019-12-19 DIAGNOSIS — Z961 Presence of intraocular lens: Secondary | ICD-10-CM | POA: Diagnosis not present

## 2019-12-19 DIAGNOSIS — H401122 Primary open-angle glaucoma, left eye, moderate stage: Secondary | ICD-10-CM | POA: Diagnosis not present

## 2019-12-19 DIAGNOSIS — H04123 Dry eye syndrome of bilateral lacrimal glands: Secondary | ICD-10-CM | POA: Diagnosis not present

## 2020-01-01 ENCOUNTER — Encounter: Payer: Self-pay | Admitting: *Deleted

## 2020-01-24 DIAGNOSIS — H409 Unspecified glaucoma: Secondary | ICD-10-CM | POA: Diagnosis not present

## 2020-01-24 DIAGNOSIS — I201 Angina pectoris with documented spasm: Secondary | ICD-10-CM | POA: Diagnosis not present

## 2020-01-24 DIAGNOSIS — E039 Hypothyroidism, unspecified: Secondary | ICD-10-CM | POA: Diagnosis not present

## 2020-01-24 DIAGNOSIS — I25119 Atherosclerotic heart disease of native coronary artery with unspecified angina pectoris: Secondary | ICD-10-CM | POA: Diagnosis not present

## 2020-01-24 DIAGNOSIS — G8929 Other chronic pain: Secondary | ICD-10-CM | POA: Diagnosis not present

## 2020-01-24 DIAGNOSIS — I10 Essential (primary) hypertension: Secondary | ICD-10-CM | POA: Diagnosis not present

## 2020-01-24 DIAGNOSIS — E785 Hyperlipidemia, unspecified: Secondary | ICD-10-CM | POA: Diagnosis not present

## 2020-03-06 DIAGNOSIS — E785 Hyperlipidemia, unspecified: Secondary | ICD-10-CM | POA: Diagnosis not present

## 2020-03-06 DIAGNOSIS — E039 Hypothyroidism, unspecified: Secondary | ICD-10-CM | POA: Diagnosis not present

## 2020-03-06 DIAGNOSIS — I10 Essential (primary) hypertension: Secondary | ICD-10-CM | POA: Diagnosis not present

## 2020-03-06 DIAGNOSIS — I201 Angina pectoris with documented spasm: Secondary | ICD-10-CM | POA: Diagnosis not present

## 2020-03-06 DIAGNOSIS — H409 Unspecified glaucoma: Secondary | ICD-10-CM | POA: Diagnosis not present

## 2020-03-06 DIAGNOSIS — G8929 Other chronic pain: Secondary | ICD-10-CM | POA: Diagnosis not present

## 2020-03-06 DIAGNOSIS — I25119 Atherosclerotic heart disease of native coronary artery with unspecified angina pectoris: Secondary | ICD-10-CM | POA: Diagnosis not present

## 2020-03-26 ENCOUNTER — Other Ambulatory Visit: Payer: Self-pay

## 2020-03-26 ENCOUNTER — Encounter: Payer: Self-pay | Admitting: Cardiovascular Disease

## 2020-03-26 ENCOUNTER — Ambulatory Visit (INDEPENDENT_AMBULATORY_CARE_PROVIDER_SITE_OTHER): Payer: PPO | Admitting: Cardiovascular Disease

## 2020-03-26 VITALS — BP 150/78 | HR 67 | Ht 65.0 in | Wt 205.6 lb

## 2020-03-26 DIAGNOSIS — E785 Hyperlipidemia, unspecified: Secondary | ICD-10-CM

## 2020-03-26 DIAGNOSIS — I10 Essential (primary) hypertension: Secondary | ICD-10-CM

## 2020-03-26 DIAGNOSIS — I739 Peripheral vascular disease, unspecified: Secondary | ICD-10-CM | POA: Diagnosis not present

## 2020-03-26 NOTE — Progress Notes (Signed)
Cardiology Office Note   Date:  03/26/2020   ID:  Yvonne, Tran 11/09/1934, MRN [id number]  PCP:  Cari Caraway, MD  Cardiologist:  Dr. Irish Lack  No chief complaint on file.     History of Present Illness: Yvonne Tran is a 86 y.o. female who is here for a follow-up visit regarding peripheral arterial disease. She had previous left iliac stenting twice most recently in 2012 for in-stent restenosis. She has known history of recurrent chest pain with no evidence of significant coronary artery disease on previous cardiac catheterization in 2009 and 2012. She is not diabetic and does not smoke. She does have known history of hypertension. She reports having back surgery done in 1986 after a spine fracture. She had very slow recovery at that time and never regained her functional capacity that she had before the event.  She has chronic lower back and bilateral leg pain at rest and with activities that is thought to be multifactorial due to spine disease as well as possible mild claudication.  She was hospitalized in September of 2020 after a fall and was found to have C5-C6 spinal stenosis.  She was also diagnosed subsequently with left lower extremity DVT and was placed on anticoagulation.  She finished treatment with Xarelto.  Most recent vascular studies in May of 2021 year showed an ABI of 0.97 on the right and 0.94 on the left with abnormal toe pressure.  Duplex showed significant right common iliac artery stenosis with peak velocity of 421.  There was also significant stenosis in the left external iliac artery with velocity close to 400.  It was difficult to determine how much claudication she was having versus spine related issues and functional capacity is overall low.  She walks slowly with a walker.   She has known history of hyperlipidemia with intolerance to statins.  She was started on Nexletol but stopped taking the medication due to some blurred  vision in the left eye.  During last visit, I started her on Zetia and she has been tolerating the medication.  She reports stable symptoms overall.  She does complain of a headache and she thinks it is due to one of her medications.     Past Medical History:  Diagnosis Date  . Chest pain with normal coronary angiography 2009, 2012  . DVT (deep venous thrombosis) ([place])    a. LLE 09/2018 following admission for fall.  . Gait instability   . Glaucoma    GLAUCOMA IN   BOTH EYES:  LEGALLY BLIND IN RIGHT EYE  . Goiter    THROID GOITER WITH COMPRESSION--PT HAVING TROUBLE SWALLOWING AND SOB  . Headache(784.0)    LEFT TEMPORAL AREA  . Hyperlipidemia   . Hypertension   . Lipoma   . Orthostasis   . PAD (peripheral artery disease) ([place])   . Pain    RIGHT THIGH FOR QUITE SOME TIME--LEG GIVES AWAY SOMETIMES.  LOWER BACK PAIN  - S/P HX OF PREVIOUS BACK SURGERY  . Palpitations    a. event monitor 02/2019 NSR with rare PACs, PVCs.  . Peripheral vascular disease ([place])    09/2008 STENT LEFT EXTERNAL ILIAC;  03/20/10 RE-STENT OF LEFT EXTERNAL ILIAC STENT- AT [facility] WITH DR. VARANASI  . Pulmonary hypertension ([place])   . Pulsatile tinnitus   . Stroke [facility])    MINI STROKE  YRS AGO     Past Surgical History:  Procedure Laterality Date  . APPENDECTOMY    .  BACK SURGERY  1988   PT STATES AFTER THE BACK SURGERY PT HAS PARALYSIS LEFT ARM AND LEG--TOOK 2&1/2 YEARS TO REGAIN USE OF LEFT SIDE--LEFT SIDE REMAINS WEAKER THAN HER RIFHT SIDE.  Marland Kitchen CARDIAC CATHETERIZATION  2012  . EYE SURGERY     left eye, lens replacement  . MASS EXCISION  01/13/2011   Procedure: EXCISION MASS;  Surgeon: Stark Klein, MD;  Location: [facility];  Service: General;  Laterality: Left;  Left abdominal wall mass (subcutaneous, 4-5 cm).  . THYROIDECTOMY  08/14/2011   Procedure: THYROIDECTOMY;  Surgeon: Earnstine Regal, MD;  [facility];  Service: General;  Laterality: N/A;  Total Thyroidectomy  . VASCULAR SURGERY     STENT PLACED 03/2010 RT  LEG     Current Outpatient Medications  Medication Sig Dispense Refill  . amLODipine (NORVASC) 10 MG tablet Take 1 tablet (10 mg total) by mouth daily. 30 tablet 3  . aspirin EC 81 MG tablet Take 1 tablet (81 mg total) by mouth daily. 90 tablet 3  . BIOTIN PO Take 1 tablet by mouth daily.    . COMBIGAN 0.2-0.5 % ophthalmic solution Place 1 drop into the right eye 2 (two) times daily.    . diclofenac Sodium (VOLTAREN) 1 % GEL Apply 2 g topically 4 (four) times daily as needed. (Patient taking differently: Apply 2 g topically 2 (two) times daily as needed.) 50 g 0  . docusate sodium (COLACE) 100 MG capsule Take 1 capsule (100 mg total) by mouth 2 (two) times daily. Also available over the counter (Patient taking differently: Take 100 mg by mouth daily as needed for mild constipation.) 60 capsule 3  . gabapentin (NEURONTIN) 300 MG capsule Take 1 capsule (300 mg total) by mouth 3 (three) times daily. (Patient taking differently: Take 300 mg by mouth daily.) 90 capsule 2  . levothyroxine (SYNTHROID, LEVOTHROID) 88 MCG tablet Take 88 mcg by mouth daily before breakfast.    . LUMIGAN 0.01 % SOLN Place 1 drop into the right eye at bedtime.    . Multiple Vitamins-Minerals (CENTRUM SILVER 50+WOMEN) TABS Take 1 tablet by mouth daily.    Mckinley Jewel Dimesylate (RHOPRESSA OP) Place 1 drop into the right eye at bedtime.    . nitroGLYCERIN (NITROSTAT) 0.4 MG SL tablet Place 1 tablet (0.4 mg total) under the tongue every 5 (five) minutes as needed. For chest pain. 25 tablet 3  . ondansetron (ZOFRAN ODT) 4 MG disintegrating tablet Take 1 tablet (4 mg total) by mouth every 8 (eight) hours as needed for nausea or vomiting. 20 tablet 0  . polyethylene glycol (MIRALAX / GLYCOLAX) 17 g packet Take 17 g by mouth daily as needed for moderate constipation. Also available OTC 30 each 0  . Propylene Glycol (SYSTANE BALANCE) 0.6 % SOLN Apply 1 drop to eye 2 (two) times daily as needed (dry eyes).    . vitamin B-12  (CYANOCOBALAMIN) 100 MCG tablet Take 100 mcg by mouth daily.    Marland Kitchen ezetimibe (ZETIA) 10 MG tablet Take 1 tablet (10 mg total) by mouth daily. 90 tablet 3   No current facility-administered medications for this visit.    Allergies:   Crestor [rosuvastatin], Eggs or egg-derived products, Lemon juice, Milk-related compounds, Repatha [evolocumab], and Shellfish allergy    Social History:  The patient  reports that she has never smoked. She has never used smokeless tobacco. She reports that she does not drink alcohol and does not use drugs.   Family History:  The patient's family history includes Cancer in her brother and mother; Heart attack in her father; Heart disease in her father; Hypertension in her father; Stroke in her mother.    ROS:  Please see the history of present illness.   Otherwise, review of systems are positive for none.   All other systems are reviewed and negative.    PHYSICAL EXAM: VS:  BP (!) 150/78   Pulse 67   Ht 5\' 5"  (1.651 m)   Wt 205 lb 9.6 oz (93.3 kg)   SpO2 97%   BMI 34.21 kg/m  , BMI Body mass index is 34.21 kg/m. GEN: Well nourished, well developed, in no acute distress  HEENT: normal  Neck: no JVD, carotid bruits, or masses Cardiac: RRR; no murmurs, rubs, or gallops,no edema  Respiratory:  clear to auscultation bilaterally, normal work of breathing GI: soft, nontender, nondistended, + BS MS: no deformity or atrophy  Skin: warm and dry, no rash Neuro:  Strength and sensation are intact Psych: euthymic mood, full affect Vascular: Femoral pulses are +1 bilaterally. Distal pulses are faint on the left.     EKG:  EKG is ordered today. EKG showed normal sinus rhythm with isolated Q-wave in lead III no significant ST or T wave changes.   Recent Labs: 07/06/2019: BUN 13; Creatinine, Ser 0.98; Hemoglobin 12.6; Platelets 241; Potassium 4.0; Sodium 141 12/14/2019: ALT 14    Lipid Panel    Component Value Date/Time   CHOL 251 (H) 12/14/2019 1140   TRIG  65 12/14/2019 1140   HDL 60 12/14/2019 1140   CHOLHDL 4.2 12/14/2019 1140   CHOLHDL 4 05/15/2014 0947   VLDL 13.6 05/15/2014 0947   LDLCALC 180 (H) 12/14/2019 1140   LDLDIRECT 196.5 12/12/2012 1104      Wt Readings from Last 3 Encounters:  03/26/20 205 lb 9.6 oz (93.3 kg)  09/19/19 204 lb (92.5 kg)  07/06/19 204 lb (92.5 kg)         ASSESSMENT AND PLAN:  1.  Peripheral arterial disease: She does have known history of iliac disease but she has symptoms that are multifactorial related to spine disease, arthritis and peripheral arterial disease.  She walks slowly with a walker and I do not think revascularization of her iliac disease will make a big impact on her symptoms.  Continue medical therapy for now.   2. Hyperlipidemia: Unfortunately, she is intolerant to statins.  She stopped taking Bempedioc acid due to blurred vision in the left eye.  She is currently on Zetia 10 mg daily.  Lipid profile showed an LDL of 180.  She did not tolerate PCSK9 inhibitors and for now the options seem to be limited.  3. Essential hypertension blood pressure is reasonably controlled on amlodipine.  Blood pressure is elevated today but usually her blood pressure is controlled.   Disposition:   FU with me in 12 months.   Signed,  Kathlyn Sacramento, MD  03/26/2020 10:33 AM    [place]

## 2020-03-26 NOTE — Patient Instructions (Signed)
Medication Instructions:  No Changes In Medications at this time.  *If you need a refill on your cardiac medications before your next appointment, please call your pharmacy*  Follow-Up: At [facility], you and your health needs are our priority.  As part of our continuing mission to provide you with exceptional heart care, we have created designated Provider Care Teams.  These Care Teams include your primary Cardiologist (physician) and Advanced Practice Providers (APPs -  Physician Assistants and Nurse Practitioners) who all work together to provide you with the care you need, when you need it.  Your next appointment:   1 year(s)  The format for your next appointment:   In Person  Provider:   Kathlyn Sacramento, MD

## 2020-04-01 ENCOUNTER — Other Ambulatory Visit: Payer: Self-pay | Admitting: Family Medicine

## 2020-04-01 DIAGNOSIS — Z1231 Encounter for screening mammogram for malignant neoplasm of breast: Secondary | ICD-10-CM

## 2020-04-02 ENCOUNTER — Other Ambulatory Visit: Payer: Self-pay

## 2020-04-02 ENCOUNTER — Ambulatory Visit
Admission: RE | Admit: 2020-04-02 | Discharge: 2020-04-02 | Disposition: A | Discharge status: Home / Self Care | Payer: PPO | Source: Ambulatory Visit | Attending: Family Medicine | Admitting: Family Medicine

## 2020-04-02 DIAGNOSIS — Z1231 Encounter for screening mammogram for malignant neoplasm of breast: Secondary | ICD-10-CM | POA: Diagnosis not present

## 2020-04-03 DIAGNOSIS — H47233 Glaucomatous optic atrophy, bilateral: Secondary | ICD-10-CM | POA: Diagnosis not present

## 2020-04-03 DIAGNOSIS — H401133 Primary open-angle glaucoma, bilateral, severe stage: Secondary | ICD-10-CM | POA: Diagnosis not present

## 2020-04-03 DIAGNOSIS — Z961 Presence of intraocular lens: Secondary | ICD-10-CM | POA: Diagnosis not present

## 2020-04-03 DIAGNOSIS — H26491 Other secondary cataract, right eye: Secondary | ICD-10-CM | POA: Diagnosis not present

## 2020-04-16 ENCOUNTER — Encounter (HOSPITAL_COMMUNITY): Payer: Self-pay | Admitting: *Deleted

## 2020-04-16 ENCOUNTER — Emergency Department (HOSPITAL_COMMUNITY): Payer: PPO

## 2020-04-16 ENCOUNTER — Emergency Department (HOSPITAL_COMMUNITY)
Admission: EM | Admit: 2020-04-16 | Discharge: 2020-04-16 | Disposition: A | Discharge status: Home / Self Care | Payer: PPO | Attending: Emergency Medicine | Admitting: Emergency Medicine

## 2020-04-16 ENCOUNTER — Other Ambulatory Visit: Payer: Self-pay

## 2020-04-16 DIAGNOSIS — R079 Chest pain, unspecified: Secondary | ICD-10-CM | POA: Diagnosis not present

## 2020-04-16 DIAGNOSIS — E039 Hypothyroidism, unspecified: Secondary | ICD-10-CM | POA: Insufficient documentation

## 2020-04-16 DIAGNOSIS — R519 Headache, unspecified: Secondary | ICD-10-CM | POA: Diagnosis not present

## 2020-04-16 DIAGNOSIS — Z7982 Long term (current) use of aspirin: Secondary | ICD-10-CM | POA: Diagnosis not present

## 2020-04-16 DIAGNOSIS — I10 Essential (primary) hypertension: Secondary | ICD-10-CM | POA: Insufficient documentation

## 2020-04-16 DIAGNOSIS — G8929 Other chronic pain: Secondary | ICD-10-CM | POA: Insufficient documentation

## 2020-04-16 DIAGNOSIS — Z79899 Other long term (current) drug therapy: Secondary | ICD-10-CM | POA: Diagnosis not present

## 2020-04-16 DIAGNOSIS — R0789 Other chest pain: Secondary | ICD-10-CM | POA: Diagnosis not present

## 2020-04-16 LAB — BASIC METABOLIC PANEL
Anion gap: 7 (ref 5–15)
BUN: 16 mg/dL (ref 8–23)
CO2: 28 mmol/L (ref 22–32)
Calcium: 9.3 mg/dL (ref 8.9–10.3)
Chloride: 110 mmol/L (ref 98–111)
Creatinine, Ser: 0.83 mg/dL (ref 0.44–1.00)
GFR, Estimated: >60 mL/min (ref >60)
Glucose, Bld: 109 mg/dL — ABNORMAL HIGH (ref 70–99)
Potassium: 3.9 mmol/L (ref 3.5–5.1)
Sodium: 145 mmol/L (ref 135–145)

## 2020-04-16 LAB — CBC
HCT: 38.8 % (ref 36.0–46.0)
Hemoglobin: 12.7 g/dL (ref 12.0–15.0)
MCH: 28.2 pg (ref 26.0–34.0)
MCHC: 32.7 g/dL (ref 30.0–36.0)
MCV: 86 fL (ref 80.0–100.0)
Platelets: 230 10*3/uL (ref 150–400)
RBC: 4.51 MIL/uL (ref 3.87–5.11)
RDW: 15.8 % — ABNORMAL HIGH (ref 11.5–15.5)
WBC: 4.5 10*3/uL (ref 4.0–10.5)
nRBC: 0 % (ref 0.0–0.2)

## 2020-04-16 LAB — CBG MONITORING, ED: Glucose-Capillary: 86 mg/dL (ref 70–99)

## 2020-04-16 LAB — TROPONIN I (HIGH SENSITIVITY)
Troponin I (High Sensitivity): 4 ng/L (ref <18)
Troponin I (High Sensitivity): 5 ng/L (ref <18)

## 2020-04-16 MED ORDER — ACETAMINOPHEN 500 MG PO TABS: 1000.0000 mg | ORAL_TABLET | Freq: Three times a day (TID) | ORAL | 0 refills | Status: AC | PRN

## 2020-04-16 MED ORDER — OMEPRAZOLE 20 MG PO CPDR: 20.0000 mg | DELAYED_RELEASE_CAPSULE | Freq: Every day | ORAL | 0 refills | Status: AC

## 2020-04-16 NOTE — Discharge Instructions (Signed)
1.  Make an appointment to see your doctor within the next 1 to 2 weeks. 2.  See your neurologist as scheduled. 3.  Take Tylenol every 6-8 hours as prescribed for headache pain. 4.  Start Prilosec once daily for possible gastroesophageal reflux disease.  Review instructions regarding this condition. 5.  Return to emergency department if you have new worsening or concerning symptoms.

## 2020-04-16 NOTE — ED Triage Notes (Signed)
Pt passed out after taking 2 sublingual nitroglycerin for chest past this morning at 8AM. She reports still having 6/10 chest pain. Hx of MI.

## 2020-04-16 NOTE — ED Provider Notes (Signed)
[facility] DEPT Provider Note   CSN: [id number] Arrival date & time: 04/16/20  6295     History Chief Complaint  Patient presents with  . Chest Pain  . Loss of Consciousness    Yvonne Tran is a 86 y.o. female.  HPI Patient reports has had a generalized headache for a number of months.  She had a fall and got diagnosed with a concussion.  Ever since that time she has been having a generalized headache.  She does not really take anything to try to treat it.  She has a follow-up with her neurologist at the beginning of May for reassessment.  She does not have any specific weakness numbness or tingling.  The patient reports that she had her typical headache but then today also felt like she got some chest pressure.  This occurred at about 8 AM.  She took her blood pressure medication and then took 2 nitroglycerin.  She reports she sat down at the table and then got very lightheaded and passed out and found herself on the floor.  She then awakened and was aware of her surroundings.  She did not awaken with any focal weakness numbness or tingling of the extremities.    Past Medical History:  Diagnosis Date  . Chest pain with normal coronary angiography 2009, 2012  . DVT (deep venous thrombosis) ([place])    a. LLE 09/2018 following admission for fall.  . Gait instability   . Glaucoma    GLAUCOMA IN   BOTH EYES:  LEGALLY BLIND IN RIGHT EYE  . Goiter    THROID GOITER WITH COMPRESSION--PT HAVING TROUBLE SWALLOWING AND SOB  . Headache(784.0)    LEFT TEMPORAL AREA  . Hyperlipidemia   . Hypertension   . Lipoma   . Orthostasis   . PAD (peripheral artery disease) ([place])   . Pain    RIGHT THIGH FOR QUITE SOME TIME--LEG GIVES AWAY SOMETIMES.  LOWER BACK PAIN  - S/P HX OF PREVIOUS BACK SURGERY  . Palpitations    a. event monitor 02/2019 NSR with rare PACs, PVCs.  . Peripheral vascular disease ([place])    09/2008 STENT LEFT EXTERNAL ILIAC;  03/20/10 RE-STENT  OF LEFT EXTERNAL ILIAC STENT- AT [facility] WITH DR. VARANASI  . Pulmonary hypertension ([place])   . Pulsatile tinnitus   . Stroke [facility])    MINI STROKE  YRS AGO     Patient Active Problem List   Diagnosis Date Noted  . Myalgia due to statin 05/18/2019  . Central cord syndrome at C5 level of cervical spinal cord ([place]) 09/17/2018  . PAD (peripheral artery disease) ([place]) 02/14/2014  . Mixed hyperlipidemia 02/10/2013  . Pain in limb 02/10/2013  . Essential hypertension, benign 02/10/2013  . Cough 10/07/2012  . Hypothyroidism, postsurgical 09/08/2011  . Multinodular goiter (nontoxic) 07/08/2011  . Abdominal wall mass of left upper quadrant, 4 x 5 cm in anterior axillary line. 11/10/2010    Past Surgical History:  Procedure Laterality Date  . APPENDECTOMY    . BACK SURGERY  1988   PT STATES AFTER THE BACK SURGERY PT HAS PARALYSIS LEFT ARM AND LEG--TOOK 2&1/2 YEARS TO REGAIN USE OF LEFT SIDE--LEFT SIDE REMAINS WEAKER THAN HER RIFHT SIDE.  Marland Kitchen CARDIAC CATHETERIZATION  2012  . EYE SURGERY     left eye, lens replacement  . MASS EXCISION  01/13/2011   Procedure: EXCISION MASS;  Surgeon: Stark Klein, MD;  Location: [facility];  Service: General;  Laterality: Left;  Left abdominal wall mass (subcutaneous, 4-5 cm).  . THYROIDECTOMY  08/14/2011   Procedure: THYROIDECTOMY;  Surgeon: Earnstine Regal, MD;  Location: [facility];  Service: General;  Laterality: N/A;  Total Thyroidectomy  . VASCULAR SURGERY     STENT PLACED 03/2010 RT LEG     OB History   No obstetric history on file.     Family History  Problem Relation Age of Onset  . Cancer Mother        brain  . Stroke Mother   . Heart disease Father   . Heart attack Father   . Hypertension Father   . Cancer Brother        prostate  . Anesthesia problems Neg Hx   . Hypotension Neg Hx   . Malignant hyperthermia Neg Hx   . Pseudochol deficiency Neg Hx   . Breast cancer Neg Hx     Social History   Tobacco Use  . Smoking status: Never Smoker  .  Smokeless tobacco: Never Used  Vaping Use  . Vaping Use: Never used  Substance Use Topics  . Alcohol use: No  . Drug use: No    Home Medications Prior to Admission medications   Medication Sig Start Date End Date Taking? Authorizing Provider  acetaminophen (TYLENOL) 500 MG tablet Take 2 tablets (1,000 mg total) by mouth every 8 (eight) hours as needed for moderate pain. 04/16/20  Yes Charlesetta Shanks, MD  amLODipine (NORVASC) 10 MG tablet Take 1 tablet (10 mg total) by mouth daily. 09/20/18  Yes Rai, Ripudeep K, MD  aspirin EC 81 MG tablet Take 1 tablet (81 mg total) by mouth daily. 06/13/19  Yes Wellington Hampshire, MD  BIOTIN PO Take 1 tablet by mouth daily.   Yes [provider]  COMBIGAN 0.2-0.5 % ophthalmic solution Place 1 drop into the right eye 2 (two) times daily. 02/12/17  Yes [provider]  docusate sodium (COLACE) 100 MG capsule Take 1 capsule (100 mg total) by mouth 2 (two) times daily. Also available over the counter Patient taking differently: Take 100 mg by mouth daily. 09/20/18  Yes Rai, Ripudeep K, MD  ezetimibe (ZETIA) 10 MG tablet Take 1 tablet (10 mg total) by mouth daily. 09/19/19 12/18/19 Yes Wellington Hampshire, MD  levothyroxine (SYNTHROID, LEVOTHROID) 88 MCG tablet Take 88 mcg by mouth daily before breakfast.   Yes [provider]  LUMIGAN 0.01 % SOLN Place 1 drop into the right eye at bedtime. 02/12/17  Yes [provider]  Multiple Vitamins-Minerals (CENTRUM SILVER 50+WOMEN) TABS Take 1 tablet by mouth daily.   Yes [provider]  nitroGLYCERIN (NITROSTAT) 0.4 MG SL tablet Place 1 tablet (0.4 mg total) under the tongue every 5 (five) minutes as needed. For chest pain. 09/19/19  Yes Wellington Hampshire, MD  omeprazole (PRILOSEC) 20 MG capsule Take 1 capsule (20 mg total) by mouth daily. 04/16/20  Yes Charlesetta Shanks, MD  vitamin B-12 (CYANOCOBALAMIN) 100 MCG tablet Take 100 mcg by mouth daily.   Yes [provider]     Allergies    Crestor [rosuvastatin], Eggs or egg-derived products, Lemon juice, Milk-related compounds, Repatha [evolocumab], and Shellfish allergy  Review of Systems   Review of Systems 10 systems reviewed negative except as per HPI Physical Exam Updated Vital Signs BP (!) 176/69   Pulse 61   Temp 97.6 F (36.4 C) (Oral)   Resp 11   Ht 5\' 5"  (1.651 m)   Wt 92.5 kg  SpO2 99%   BMI 33.95 kg/m   Physical Exam Constitutional:      Appearance: She is well-developed.     Comments: Alert and nontoxic.  Well in appearance.  Clear mental status.  HENT:     Head: Normocephalic and atraumatic.     Mouth/Throat:     Mouth: Mucous membranes are moist.     Pharynx: Oropharynx is clear.  Eyes:     Extraocular Movements: Extraocular movements intact.     Pupils: Pupils are equal, round, and reactive to light.  Cardiovascular:     Rate and Rhythm: Normal rate and regular rhythm.     Heart sounds: Normal heart sounds.  Pulmonary:     Effort: Pulmonary effort is normal.     Breath sounds: Normal breath sounds.  Abdominal:     General: Bowel sounds are normal. There is no distension.     Palpations: Abdomen is soft.     Tenderness: There is no abdominal tenderness.  Musculoskeletal:        General: No swelling or tenderness. Normal range of motion.     Cervical back: Neck supple.     Right lower leg: No edema.     Left lower leg: No edema.  Skin:    General: Skin is warm and dry.  Neurological:     General: No focal deficit present.     Mental Status: She is alert and oriented to person, place, and time.     GCS: GCS eye subscore is 4. GCS verbal subscore is 5. GCS motor subscore is 6.     Cranial Nerves: No cranial nerve deficit.     Sensory: No sensory deficit.     Motor: No weakness.     Coordination: Coordination normal.  Psychiatric:        Mood and Affect: Mood normal.     ED Results / Procedures / Treatments   Labs (all labs ordered are listed, but only  abnormal results are displayed) Labs Reviewed  BASIC METABOLIC PANEL - Abnormal; Notable for the following components:      Result Value   Glucose, Bld 109 (*)    All other components within normal limits  CBC - Abnormal; Notable for the following components:   RDW 15.8 (*)    All other components within normal limits  CBG MONITORING, ED  TROPONIN I (HIGH SENSITIVITY)  TROPONIN I (HIGH SENSITIVITY)    EKG EKG Interpretation  Date/Time:  Tuesday April 16 2020 09:58:49 EDT Ventricular Rate:  72 PR Interval:  139 QRS Duration: 90 QT Interval:  443 QTC Calculation: 485 R Axis:   27 Text Interpretation: Sinus rhythm Abnormal R-wave progression, early transition no acute ischemic appearance, no sig change fromprevious Confirmed by Charlesetta Shanks [phone]) on 04/16/2020 11:03:41 AM   Radiology DG Chest 2 View  Result Date: 04/16/2020 CLINICAL DATA:  Chest pain EXAM: CHEST - 2 VIEW COMPARISON:  July 06, 2019; CT angiogram chest July 06, 2019 FINDINGS: There is elevation of the right hemidiaphragm. There is no edema or airspace opacity. The heart size and pulmonary vascularity are normal. There is aortic atherosclerosis. No adenopathy. There is arthropathy in each shoulder with superior migration of each humeral head. IMPRESSION: Elevation right hemidiaphragm. No edema or airspace opacity. Heart size normal. Arthropathy in each shoulder with superior migration of each humeral head, a finding likely due to chronic rotator cuff tears. Aortic Atherosclerosis (ICD10-I70.0). Electronically Signed   By: Lowella Grip III M.D.   On:  04/16/2020 11:20   CT Head Wo Contrast  Result Date: 04/16/2020 CLINICAL DATA:  Syncopal episode. Headache. EXAM: CT HEAD WITHOUT CONTRAST TECHNIQUE: Contiguous axial images were obtained from the base of the skull through the vertex without intravenous contrast. COMPARISON:  Head CT 09/17/2018 FINDINGS: Brain: The ventricles are in the midline without mass effect or  shift. They are normal in size and configuration for age. No extra-axial fluid collections are identified. No CT findings for hemispheric infarction or intracranial hemorrhage. Stable benign-appearing bilateral basal ganglia calcifications. Brainstem and cerebellum are unremarkable and stable. No intracranial mass lesions. Vascular: Stable vascular calcifications. No aneurysm or hyperdense vessels. Skull: No skull fracture or bone lesions. Stable changes of hyperostosis frontalis interna. Sinuses/Orbits: The paranasal sinuses and mastoid air cells are clear. Globes are intact. Other: No subcutaneous hematoma. IMPRESSION: No acute intracranial findings or mass lesions. Electronically Signed   By: Marijo Sanes M.D.   On: 04/16/2020 13:36    Procedures Procedures   Medications Ordered in ED Medications - No data to display  ED Course  I have reviewed the triage vital signs and the nursing notes.  Pertinent labs & imaging results that were available during my care of the patient were reviewed by me and considered in my medical decision making (see chart for details).    MDM Rules/Calculators/A&P                          Patient is been having headaches persistently for months.  She is being treated by neurology for what sounds like postconcussive headache.  There is no change in the quality or severity of headaches.  Patient does describe getting a sense of chest pressure and then taking nitroglycerin this morning.  I suspect in combination of blood pressure medication and nitroglycerin, patient became significantly orthostatic hypotensive, precipitating syncopal episode.  CT head obtained to rule out any injury associated with patient's fall or subacute subdural hematoma.  No acute findings on CT scan.  Patient is neurologically intact.  At this time, I do not suspect ongoing unstable angina.  Patient's chest pain is resolved.  No ischemic changes acutely on her EKG.  Troponins are negative.  I do  think patient is stable to follow-up with cardiology and her PCP.  It sounds as though she has not had cardiac testing since 2012.  At that time however she had no significant coronary artery disease by catheterization.  She had ongoing diagnosis of noncardiac chest pain.. Return precautions and follow-up plan reviewed. Final Clinical Impression(s) / ED Diagnoses Final diagnoses:  Chest pain, unspecified type  Chronic nonintractable headache, unspecified headache type    Rx / DC Orders ED Discharge Orders         Ordered    omeprazole (PRILOSEC) 20 MG capsule  Daily        04/16/20 1524    acetaminophen (TYLENOL) 500 MG tablet  Every 8 hours PRN        04/16/20 1524           Charlesetta Shanks, MD 04/17/20 1303

## 2020-04-26 ENCOUNTER — Telehealth: Payer: Self-pay | Admitting: Cardiovascular Disease

## 2020-04-26 NOTE — Telephone Encounter (Signed)
STAT if patient feels like he/she is going to faint   1) Are you dizzy now? Yes   2) Do you feel faint or have you passed out?   Last week patient went to the hospital because she became dizzy and loss consciousness.       She states she still feels faint right now.   3) Do you have any other symptoms?  She had chest pain when admitted last week. No symptoms currently.   4) Have you checked your HR and BP (record if available)?  No readings available.

## 2020-04-26 NOTE — Telephone Encounter (Signed)
Received a call from patient she stated she continues to be dizzy and leg weakness.She went to [facility] ED this past Tue 4/12.Appointment scheduled with Dr.Varanasi 05/20/20 at 1:40 pm.Appointment offered sooner with extender, but patient unable to come due to other appointments.She will call PCP for appointment.

## 2020-04-30 NOTE — Telephone Encounter (Signed)
Called the patient to check on her. She stated that she was okay, just tired. She has an appointment on 5/16 with Dr. Irish Lack. She has been advised to call back sooner if anything is needed.

## 2020-05-13 ENCOUNTER — Ambulatory Visit (HOSPITAL_COMMUNITY)
Admission: RE | Admit: 2020-05-13 | Payer: PPO | Source: Ambulatory Visit | Attending: Interventional Cardiology | Admitting: Interventional Cardiology

## 2020-05-13 ENCOUNTER — Ambulatory Visit (HOSPITAL_COMMUNITY): Payer: PPO

## 2020-05-19 NOTE — Progress Notes (Signed)
Cardiology Office Note   Date:  05/20/2020   ID:  Yvonne Tran, DOB 12-Jul-1934, MRN [id number]  PCP:  Cari Caraway, MD    No chief complaint on file.  Chest pain  Wt Readings from Last 3 Encounters:  05/20/20 205 lb 3.2 oz (93.1 kg)  04/16/20 204 lb (92.5 kg)  03/26/20 205 lb 9.6 oz (93.3 kg)       History of Present Illness: Yvonne Tran is a 86 y.o. female  with PAD. She has had left iliac stents placed in the past.  Her walking is limited by balance problemsand nowuses a cane, orrolling walker.  CardiacCaths in 2009 and 2012 were clear of any significant CAD.  She is limited by back pain.   She reportedtrouble swallowing since her thyroid operation in the past.It improved, but still occasionally causes problems. Was Better with drinking water.  In the past, she noted the following two problems.: She can hear her heartbeat in her right ear, and describes it as a "thunderous noise."  Left leg burning and itching, anteriorly.   Negative stress test in 2019.   Went to ER for chest pain in 4/22: "had a generalized headache for a number of months.  She had a fall and got diagnosed with a concussion.  Ever since that time she has been having a generalized headache.  She does not really take anything to try to treat it.  She has a follow-up with her neurologist at the beginning of May for reassessment.  She does not have any specific weakness numbness or tingling.  The patient reports that she had her typical headache but then today also felt like she got some chest pressure.  This occurred at about 8 AM.  She took her blood pressure medication and then took 2 nitroglycerin.  She reports she sat down at the table and then got very lightheaded and passed out and found herself on the floor.  She then awakened and was aware of her surroundings.  She did not awaken with any focal weakness numbness or tingling of the extremities." Plan  was : "Patient does describe getting a sense of chest pressure and then taking nitroglycerin this morning.  I suspect in combination of blood pressure medication and nitroglycerin, patient became significantly orthostatic hypotensive, precipitating syncopal episode.  CT head obtained to rule out any injury associated with patient's fall or subacute subdural hematoma.  No acute findings on CT scan.  Patient is neurologically intact.  At this time, I do not suspect ongoing unstable angina.  Patient's chest pain is resolved.  No ischemic changes acutely on her EKG.  Troponins are negative.  I do think patient is stable to follow-up with cardiology and her PCP."  She has soreness in her chest.  It is constant for days.  She feels some skipped beats at times. Her BP at home has been in the normal range.  Systolic in the 956 mm Hg range typically.    Past Medical History:  Diagnosis Date  . Chest pain with normal coronary angiography 2009, 2012  . DVT (deep venous thrombosis) ([place])    a. LLE 09/2018 following admission for fall.  . Gait instability   . Glaucoma    GLAUCOMA IN   BOTH EYES:  LEGALLY BLIND IN RIGHT EYE  . Goiter    THROID GOITER WITH COMPRESSION--PT HAVING TROUBLE SWALLOWING AND SOB  . Headache(784.0)    LEFT TEMPORAL AREA  . Hyperlipidemia   .  Hypertension   . Lipoma   . Orthostasis   . PAD (peripheral artery disease) ([place])   . Pain    RIGHT THIGH FOR QUITE SOME TIME--LEG GIVES AWAY SOMETIMES.  LOWER BACK PAIN  - S/P HX OF PREVIOUS BACK SURGERY  . Palpitations    a. event monitor 02/2019 NSR with rare PACs, PVCs.  . Peripheral vascular disease ([place])    09/2008 STENT LEFT EXTERNAL ILIAC;  03/20/10 RE-STENT OF LEFT EXTERNAL ILIAC STENT- AT [facility] WITH DR. Nicklos Gaxiola  . Pulmonary hypertension ([place])   . Pulsatile tinnitus   . Stroke [facility])    MINI STROKE  YRS AGO     Past Surgical History:  Procedure Laterality Date  . APPENDECTOMY    . BACK SURGERY  1988   PT STATES AFTER THE BACK  SURGERY PT HAS PARALYSIS LEFT ARM AND LEG--TOOK 2&1/2 YEARS TO REGAIN USE OF LEFT SIDE--LEFT SIDE REMAINS WEAKER THAN HER RIFHT SIDE.  Marland Kitchen CARDIAC CATHETERIZATION  2012  . EYE SURGERY     left eye, lens replacement  . MASS EXCISION  01/13/2011   Procedure: EXCISION MASS;  Surgeon: Stark Klein, MD;  Location: [facility];  Service: General;  Laterality: Left;  Left abdominal wall mass (subcutaneous, 4-5 cm).  . THYROIDECTOMY  08/14/2011   Procedure: THYROIDECTOMY;  Surgeon: Earnstine Regal, MD;  [facility];  Service: General;  Laterality: N/A;  Total Thyroidectomy  . VASCULAR SURGERY     STENT PLACED 03/2010 RT LEG     Current Outpatient Medications  Medication Sig Dispense Refill  . acetaminophen (TYLENOL) 500 MG tablet Take 2 tablets (1,000 mg total) by mouth every 8 (eight) hours as needed for moderate pain. 30 tablet 0  . amLODipine (NORVASC) 10 MG tablet Take 1 tablet (10 mg total) by mouth daily. 30 tablet 3  . aspirin EC 81 MG tablet Take 1 tablet (81 mg total) by mouth daily. 90 tablet 3  . BIOTIN PO Take 1 tablet by mouth daily.    . COMBIGAN 0.2-0.5 % ophthalmic solution Place 1 drop into the right eye 2 (two) times daily.    Marland Kitchen docusate sodium (COLACE) 100 MG capsule Take 1 capsule (100 mg total) by mouth 2 (two) times daily. Also available over the counter (Patient taking differently: Take 100 mg by mouth daily.) 60 capsule 3  . ezetimibe (ZETIA) 10 MG tablet Take 1 tablet (10 mg total) by mouth daily. 90 tablet 3  . levothyroxine (SYNTHROID, LEVOTHROID) 88 MCG tablet Take 88 mcg by mouth daily before breakfast.    . LUMIGAN 0.01 % SOLN Place 1 drop into the right eye at bedtime.    . Multiple Vitamins-Minerals (CENTRUM SILVER 50+WOMEN) TABS Take 1 tablet by mouth daily.    . nitroGLYCERIN (NITROSTAT) 0.4 MG SL tablet Place 1 tablet (0.4 mg total) under the tongue every 5 (five) minutes as needed. For chest pain. 25 tablet 3  . omeprazole (PRILOSEC) 20 MG capsule Take 1 capsule (20 mg  total) by mouth daily. 30 capsule 0  . vitamin B-12 (CYANOCOBALAMIN) 100 MCG tablet Take 100 mcg by mouth daily.     No current facility-administered medications for this visit.    Allergies:   Crestor [rosuvastatin], Eggs or egg-derived products, Lemon juice, Milk-related compounds, Repatha [evolocumab], and Shellfish allergy    Social History:  The patient  reports that she has never smoked. She has never used smokeless tobacco. She reports that she does not drink alcohol and does not use  drugs.   Family History:  The patient's family history includes Cancer in her brother and mother; Heart attack in her father; Heart disease in her father; Hypertension in her father; Stroke in her mother.    ROS:  Please see the history of present illness.   Otherwise, review of systems are positive for headaches after concussion.   All other systems are reviewed and negative.    PHYSICAL EXAM: VS:  BP (!) 162/74   Pulse 76   Ht 5\' 5"  (1.651 m)   Wt 205 lb 3.2 oz (93.1 kg)   SpO2 95%   BMI 34.15 kg/m  , BMI Body mass index is 34.15 kg/m. GEN: Well nourished, well developed, in no acute distress  HEENT: normal  Neck: no JVD, carotid bruits, or masses Cardiac: RRR; no murmurs, rubs, or gallops,no edema  Respiratory:  clear to auscultation bilaterally, normal work of breathing GI: soft, nontender, nondistended, + BS MS: no deformity or atrophy  Skin: warm and dry, no rash Neuro:  Strength and sensation are intact; using walker Psych: euthymic mood, full affect   EKG:   The ekg ordered today demonstrates NSR, no ST changes   Recent Labs: 12/14/2019: ALT 14 04/16/2020: BUN 16; Creatinine, Ser 0.83; Hemoglobin 12.7; Platelets 230; Potassium 3.9; Sodium 145   Lipid Panel    Component Value Date/Time   CHOL 251 (H) 12/14/2019 1140   TRIG 65 12/14/2019 1140   HDL 60 12/14/2019 1140   CHOLHDL 4.2 12/14/2019 1140   CHOLHDL 4 05/15/2014 0947   VLDL 13.6 05/15/2014 0947   LDLCALC 180 (H)  12/14/2019 1140   LDLDIRECT 196.5 12/12/2012 1104     Other studies Reviewed: Additional studies/ records that were reviewed today with results demonstrating:  ER records reviewed.  Troponin negative.    ASSESSMENT AND PLAN:  1. Chest pain: chest wall pain. Worse with palpation. No change with exertion.  If character of pain changes or it becomes more severe, we could consider a stress test.  2. PAD/aortic atherosclerosis: healthy diet. Follows with Dr. Fletcher Anon.   3. HTN: The current medical regimen is effective;  continue present plan and medications. 4. Hyperlipidemia: statin intolerant. intolerant of repatha.  Healthy diet. High fiber diet.   5. Balance disorder: Still off.  [place] of her bed causing the concussion.  Persistent headaches since that time.  I encouraged her to be careful to avoid falling.  6. Leg burning:  This has persisted as well.  No nonhealing sores.    Current medicines are reviewed at length with the patient today.  The patient concerns regarding her medicines were addressed.  The following changes have been made:  No change  Labs/ tests ordered today include:  No orders of the defined types were placed in this encounter.   Recommend 150 minutes/week of aerobic exercise Low fat, low carb, high fiber diet recommended  Disposition:   FU in 6 months   Signed, Larae Grooms, MD  05/20/2020 1:56 PM    [facility] [address], [place], [place]  [address] Phone: [phone]; Fax: [phone]

## 2020-05-20 ENCOUNTER — Encounter: Payer: Self-pay | Admitting: Interventional Cardiology

## 2020-05-20 ENCOUNTER — Ambulatory Visit (INDEPENDENT_AMBULATORY_CARE_PROVIDER_SITE_OTHER): Payer: PPO | Admitting: Interventional Cardiology

## 2020-05-20 ENCOUNTER — Other Ambulatory Visit: Payer: Self-pay

## 2020-05-20 VITALS — BP 162/74 | HR 76 | Ht 65.0 in | Wt 205.2 lb

## 2020-05-20 DIAGNOSIS — R072 Precordial pain: Secondary | ICD-10-CM | POA: Diagnosis not present

## 2020-05-20 DIAGNOSIS — Z789 Other specified health status: Secondary | ICD-10-CM

## 2020-05-20 DIAGNOSIS — I10 Essential (primary) hypertension: Secondary | ICD-10-CM

## 2020-05-20 DIAGNOSIS — I739 Peripheral vascular disease, unspecified: Secondary | ICD-10-CM

## 2020-05-20 DIAGNOSIS — E782 Mixed hyperlipidemia: Secondary | ICD-10-CM

## 2020-05-20 NOTE — Patient Instructions (Signed)
Medication Instructions:  Your physician recommends that you continue on your current medications as directed. Please refer to the Current Medication list given to you today.  *If you need a refill on your cardiac medications before your next appointment, please call your pharmacy*   Lab Work: none If you have labs (blood work) drawn today and your tests are completely normal, you will receive your results only by: MyChart Message (if you have MyChart) OR A paper copy in the mail If you have any lab test that is abnormal or we need to change your treatment, we will call you to review the results.   Testing/Procedures: none   Follow-Up: At [facility], you and your health needs are our priority.  As part of our continuing mission to provide you with exceptional heart care, we have created designated Provider Care Teams.  These Care Teams include your primary Cardiologist (physician) and Advanced Practice Providers (APPs -  Physician Assistants and Nurse Practitioners) who all work together to provide you with the care you need, when you need it.  We recommend signing up for the patient portal called "MyChart".  Sign up information is provided on this After Visit Summary.  MyChart is used to connect with patients for Virtual Visits (Telemedicine).  Patients are able to view lab/test results, encounter notes, upcoming appointments, etc.  Non-urgent messages can be sent to your provider as well.   To learn more about what you can do with MyChart, go to [email or web address].    Your next appointment:   6 month(s)  The format for your next appointment:   In Person  Provider:   You may see Jayadeep Varanasi, MD or one of the following Advanced Practice Providers on your designated Care Team:   Dayna Dunn, PA-C Michele Lenze, PA-C   Other Instructions   

## 2020-05-24 ENCOUNTER — Other Ambulatory Visit (HOSPITAL_COMMUNITY): Payer: Self-pay | Admitting: Cardiovascular Disease

## 2020-05-24 ENCOUNTER — Other Ambulatory Visit: Payer: Self-pay

## 2020-05-24 ENCOUNTER — Ambulatory Visit (HOSPITAL_COMMUNITY)
Admission: RE | Admit: 2020-05-24 | Discharge: 2020-05-24 | Disposition: A | Discharge status: Home / Self Care | Payer: PPO | Source: Ambulatory Visit | Attending: Cardiology | Admitting: Cardiology

## 2020-05-24 ENCOUNTER — Ambulatory Visit (HOSPITAL_BASED_OUTPATIENT_CLINIC_OR_DEPARTMENT_OTHER)
Admission: RE | Admit: 2020-05-24 | Discharge: 2020-05-24 | Disposition: A | Discharge status: Home / Self Care | Payer: PPO | Source: Ambulatory Visit | Attending: Interventional Cardiology | Admitting: Interventional Cardiology

## 2020-05-24 DIAGNOSIS — Z95828 Presence of other vascular implants and grafts: Secondary | ICD-10-CM

## 2020-05-27 DIAGNOSIS — S14125S Central cord syndrome at C5 level of cervical spinal cord, sequela: Secondary | ICD-10-CM | POA: Diagnosis not present

## 2020-05-27 DIAGNOSIS — I25119 Atherosclerotic heart disease of native coronary artery with unspecified angina pectoris: Secondary | ICD-10-CM | POA: Diagnosis not present

## 2020-05-27 DIAGNOSIS — H409 Unspecified glaucoma: Secondary | ICD-10-CM | POA: Diagnosis not present

## 2020-05-27 DIAGNOSIS — R7989 Other specified abnormal findings of blood chemistry: Secondary | ICD-10-CM | POA: Diagnosis not present

## 2020-05-27 DIAGNOSIS — I10 Essential (primary) hypertension: Secondary | ICD-10-CM | POA: Diagnosis not present

## 2020-05-27 DIAGNOSIS — G44309 Post-traumatic headache, unspecified, not intractable: Secondary | ICD-10-CM | POA: Diagnosis not present

## 2020-05-27 DIAGNOSIS — M1909 Primary osteoarthritis, other specified site: Secondary | ICD-10-CM | POA: Diagnosis not present

## 2020-05-27 DIAGNOSIS — E039 Hypothyroidism, unspecified: Secondary | ICD-10-CM | POA: Diagnosis not present

## 2020-05-27 DIAGNOSIS — E785 Hyperlipidemia, unspecified: Secondary | ICD-10-CM | POA: Diagnosis not present

## 2020-06-04 DIAGNOSIS — E785 Hyperlipidemia, unspecified: Secondary | ICD-10-CM | POA: Diagnosis not present

## 2020-06-04 DIAGNOSIS — I25119 Atherosclerotic heart disease of native coronary artery with unspecified angina pectoris: Secondary | ICD-10-CM | POA: Diagnosis not present

## 2020-06-04 DIAGNOSIS — E039 Hypothyroidism, unspecified: Secondary | ICD-10-CM | POA: Diagnosis not present

## 2020-06-04 DIAGNOSIS — I201 Angina pectoris with documented spasm: Secondary | ICD-10-CM | POA: Diagnosis not present

## 2020-06-04 DIAGNOSIS — G8929 Other chronic pain: Secondary | ICD-10-CM | POA: Diagnosis not present

## 2020-06-04 DIAGNOSIS — H409 Unspecified glaucoma: Secondary | ICD-10-CM | POA: Diagnosis not present

## 2020-06-04 DIAGNOSIS — I10 Essential (primary) hypertension: Secondary | ICD-10-CM | POA: Diagnosis not present

## 2020-06-26 DIAGNOSIS — I25119 Atherosclerotic heart disease of native coronary artery with unspecified angina pectoris: Secondary | ICD-10-CM | POA: Diagnosis not present

## 2020-06-26 DIAGNOSIS — G8929 Other chronic pain: Secondary | ICD-10-CM | POA: Diagnosis not present

## 2020-06-26 DIAGNOSIS — I10 Essential (primary) hypertension: Secondary | ICD-10-CM | POA: Diagnosis not present

## 2020-06-26 DIAGNOSIS — E039 Hypothyroidism, unspecified: Secondary | ICD-10-CM | POA: Diagnosis not present

## 2020-06-26 DIAGNOSIS — E785 Hyperlipidemia, unspecified: Secondary | ICD-10-CM | POA: Diagnosis not present

## 2020-06-26 DIAGNOSIS — H409 Unspecified glaucoma: Secondary | ICD-10-CM | POA: Diagnosis not present

## 2020-06-26 DIAGNOSIS — I201 Angina pectoris with documented spasm: Secondary | ICD-10-CM | POA: Diagnosis not present

## 2020-07-01 DIAGNOSIS — M549 Dorsalgia, unspecified: Secondary | ICD-10-CM | POA: Diagnosis not present

## 2020-07-01 DIAGNOSIS — I201 Angina pectoris with documented spasm: Secondary | ICD-10-CM | POA: Diagnosis not present

## 2020-07-01 DIAGNOSIS — H548 Legal blindness, as defined in USA: Secondary | ICD-10-CM | POA: Diagnosis not present

## 2020-07-01 DIAGNOSIS — I739 Peripheral vascular disease, unspecified: Secondary | ICD-10-CM | POA: Diagnosis not present

## 2020-07-01 DIAGNOSIS — Z7409 Other reduced mobility: Secondary | ICD-10-CM | POA: Diagnosis not present

## 2020-07-11 DIAGNOSIS — I25119 Atherosclerotic heart disease of native coronary artery with unspecified angina pectoris: Secondary | ICD-10-CM | POA: Diagnosis not present

## 2020-07-11 DIAGNOSIS — G8929 Other chronic pain: Secondary | ICD-10-CM | POA: Diagnosis not present

## 2020-07-11 DIAGNOSIS — I201 Angina pectoris with documented spasm: Secondary | ICD-10-CM | POA: Diagnosis not present

## 2020-07-11 DIAGNOSIS — H409 Unspecified glaucoma: Secondary | ICD-10-CM | POA: Diagnosis not present

## 2020-07-11 DIAGNOSIS — E039 Hypothyroidism, unspecified: Secondary | ICD-10-CM | POA: Diagnosis not present

## 2020-07-11 DIAGNOSIS — I10 Essential (primary) hypertension: Secondary | ICD-10-CM | POA: Diagnosis not present

## 2020-07-11 DIAGNOSIS — E785 Hyperlipidemia, unspecified: Secondary | ICD-10-CM | POA: Diagnosis not present

## 2020-07-24 IMAGING — CR DG KNEE COMPLETE 4+V*L*
4 series · 4 of 4 positions shown · non-contrast
Comparison: None.

CLINICAL DATA: Fall with leg pain

EXAM:
LEFT KNEE - COMPLETE 4+ VIEW

[x knee ap left (1 of 3)]
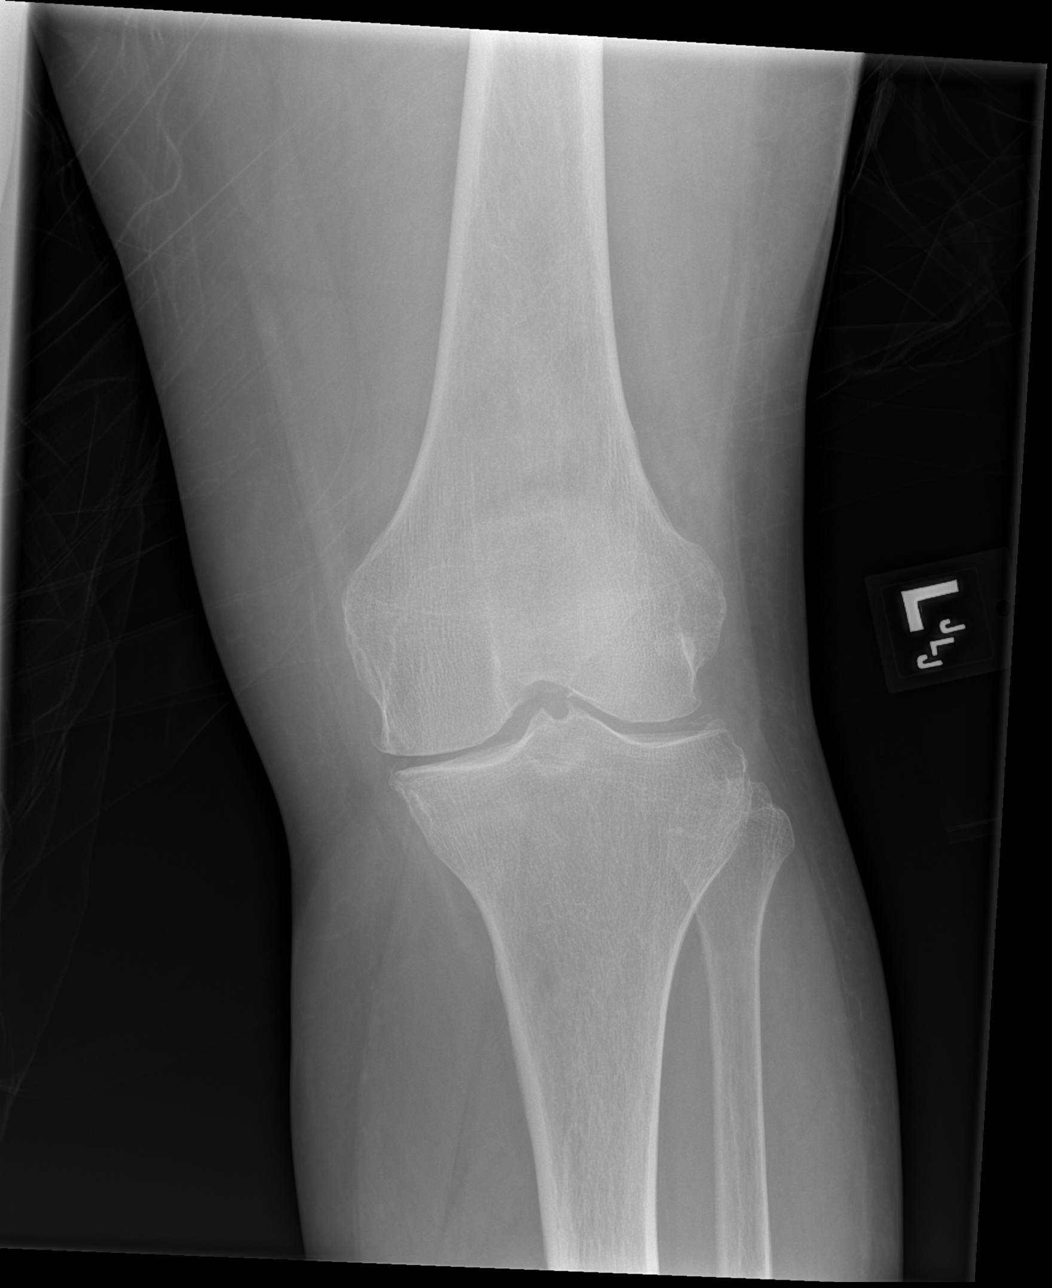

[x knee ap left (2 of 3)]
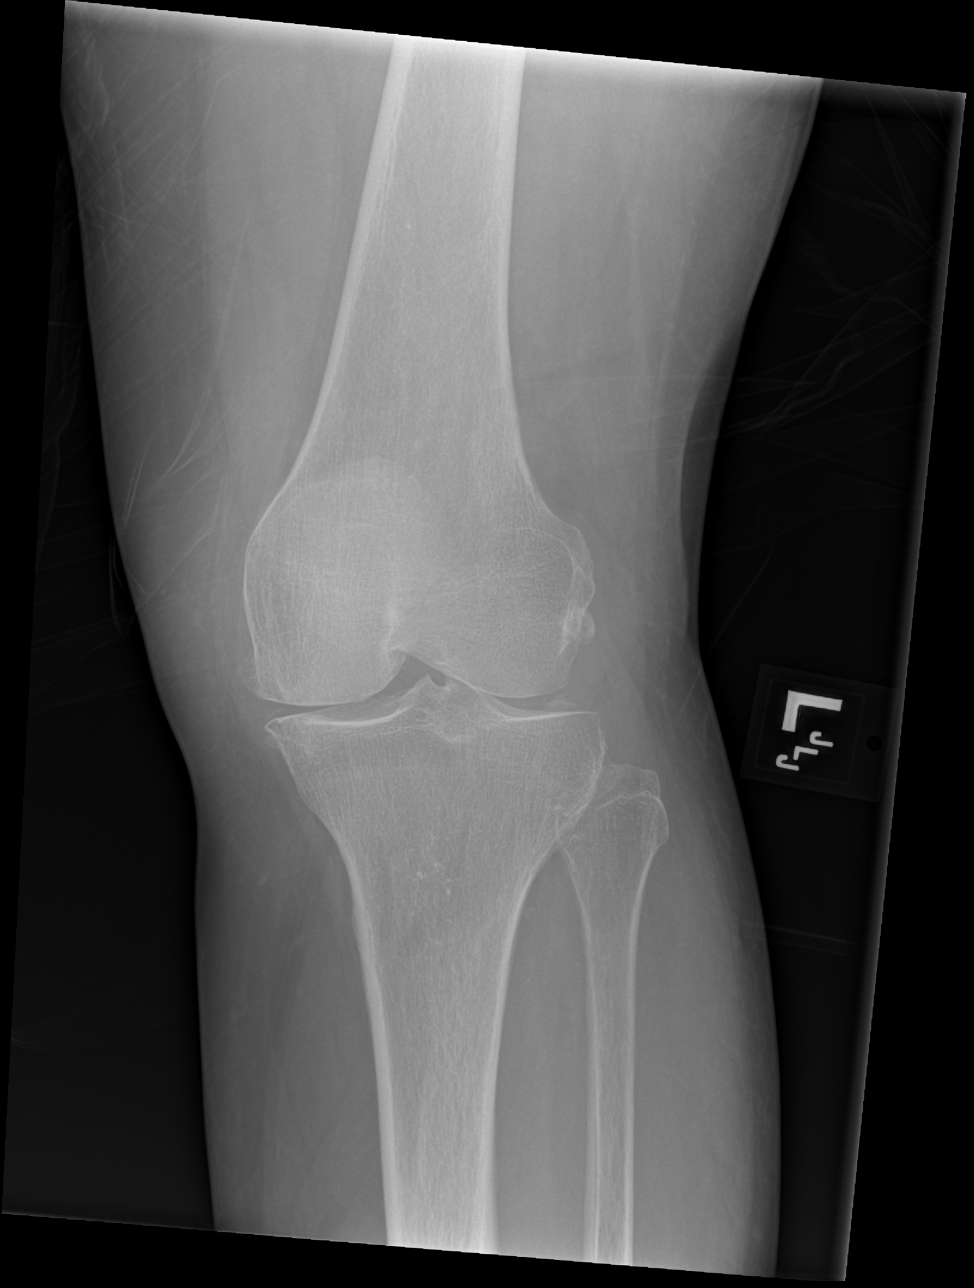

[x knee ap left (3 of 3)]
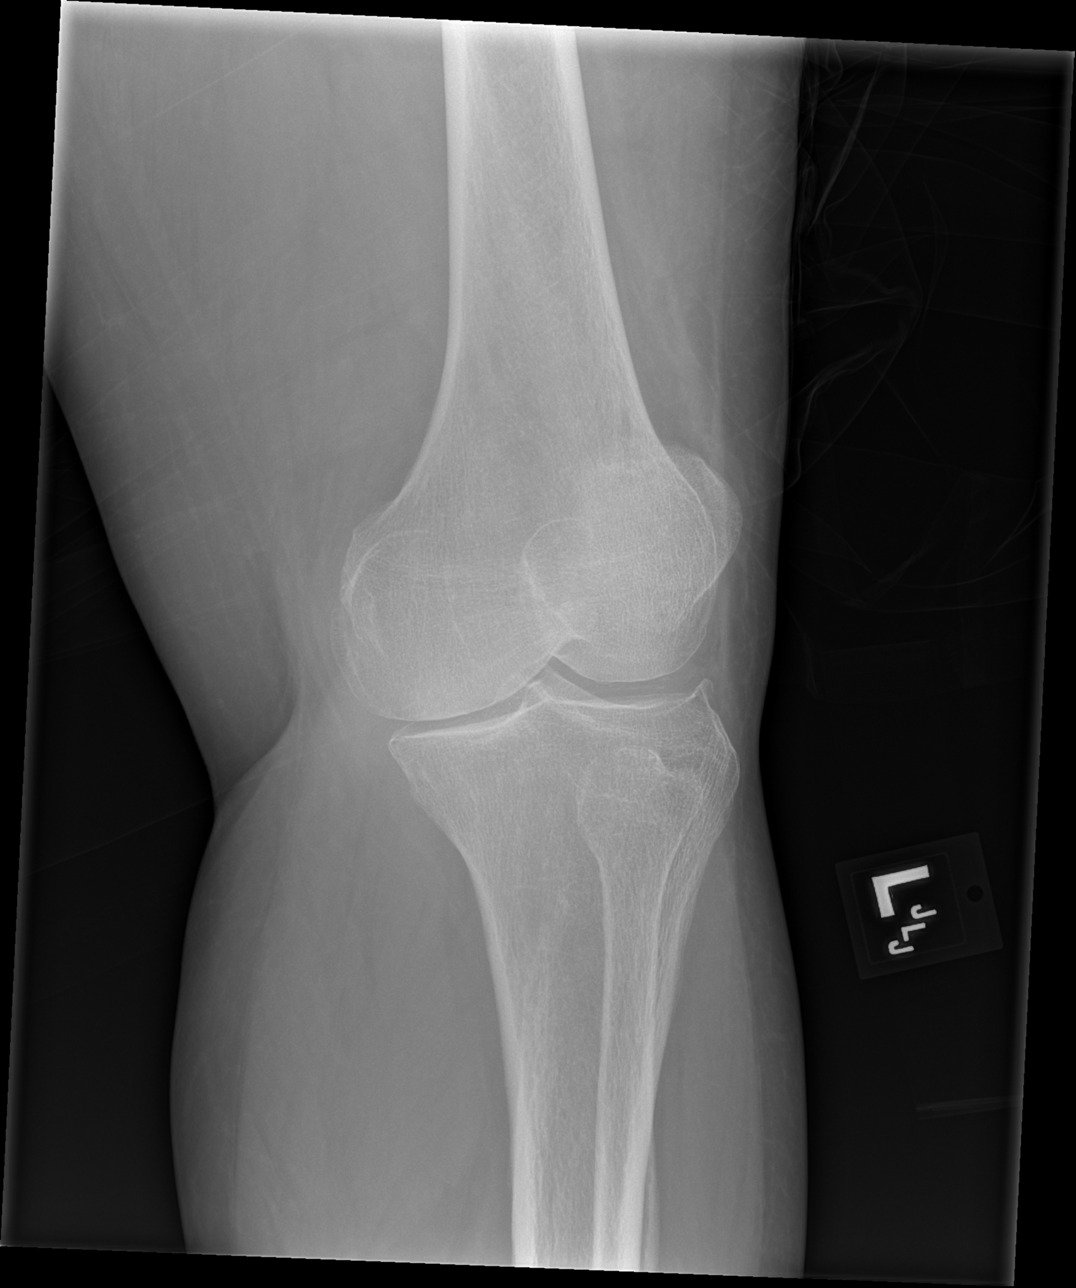

[x knee lat left]
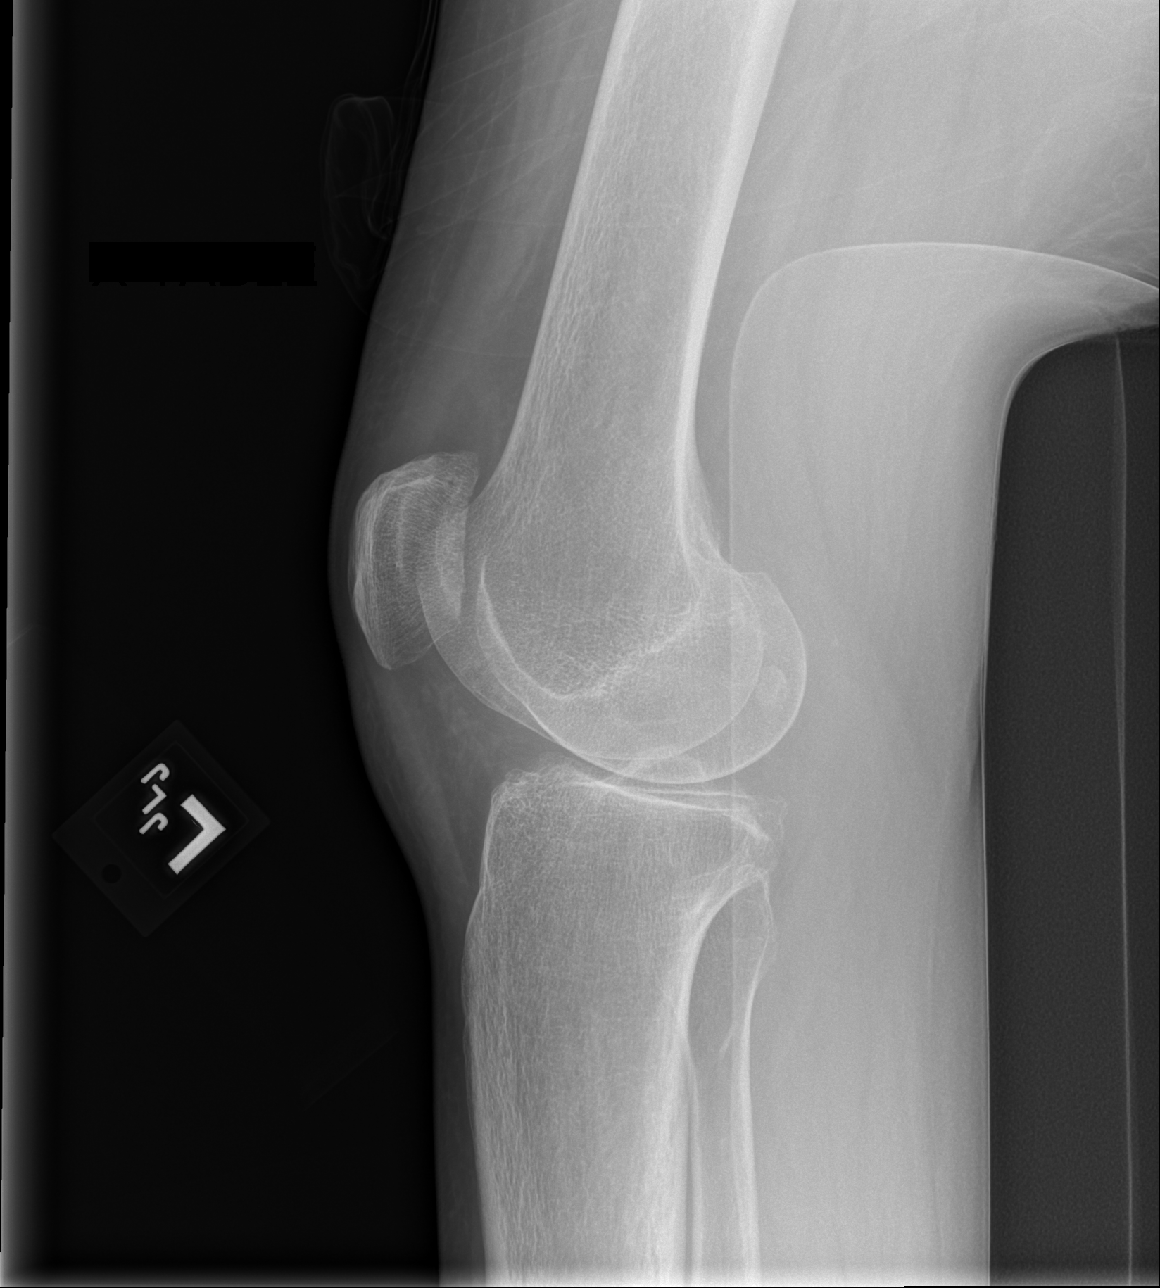

[4 of 4 positions shown; findings below may reference images not displayed]

FINDINGS: No fracture or malalignment. Mild to moderate medial and lateral
joint space degenerative changes. Mild patellofemoral degenerative
change. Mild joint space calcification. No large knee effusion
IMPRESSION: 1. No acute osseous abnormality
2. Arthritis of the knee
3. Chondrocalcinosis

## 2020-08-02 DIAGNOSIS — I10 Essential (primary) hypertension: Secondary | ICD-10-CM | POA: Diagnosis not present

## 2020-08-15 ENCOUNTER — Other Ambulatory Visit: Payer: Self-pay

## 2020-08-15 ENCOUNTER — Encounter: Payer: Self-pay | Admitting: Neurology

## 2020-08-15 ENCOUNTER — Ambulatory Visit (INDEPENDENT_AMBULATORY_CARE_PROVIDER_SITE_OTHER): Payer: PPO | Admitting: Neurology

## 2020-08-15 VITALS — BP 179/83 | HR 68 | Ht 65.0 in | Wt 201.0 lb

## 2020-08-15 DIAGNOSIS — I739 Peripheral vascular disease, unspecified: Secondary | ICD-10-CM | POA: Diagnosis not present

## 2020-08-15 DIAGNOSIS — G441 Vascular headache, not elsewhere classified: Secondary | ICD-10-CM | POA: Insufficient documentation

## 2020-08-15 DIAGNOSIS — G44329 Chronic post-traumatic headache, not intractable: Secondary | ICD-10-CM | POA: Diagnosis not present

## 2020-08-15 DIAGNOSIS — I10 Essential (primary) hypertension: Secondary | ICD-10-CM | POA: Diagnosis not present

## 2020-08-15 MED ORDER — UBRELVY 100 MG PO TABS: ORAL_TABLET | ORAL | 1 refills | Status: AC

## 2020-08-15 MED ORDER — VERAPAMIL HCL ER 120 MG PO TBCR: 120.0000 mg | EXTENDED_RELEASE_TABLET | Freq: Every day | ORAL | 5 refills | Status: AC

## 2020-08-15 NOTE — Patient Instructions (Addendum)
reatment plan and additional workup :  Ubrelvy.: Prn for acute headaches.   Verapamil: start as a preventive at night 120 mg. You take this medication at night and stay on AMLODIPINE in AM.  Your headaches are also Hypertension related, causing vertigo/ tinnitus with high BP.  Migraine symptoms   MRI not needed as Headaches have been present since before last imaging study.   Follow up prn in 6 month with Headache clinic at [facility]- NP or MD   Ubrogepant tablets What is this medication? UBROGEPANT (ue BROE je pant) is used to treat migraine headaches with or without aura. An aura is a strange feeling or visual disturbance that warns youof an attack. It is not used to prevent migraines. This medicine may be used for other purposes; ask your health care provider orpharmacist if you have questions. COMMON BRAND NAME(S): Yvonne Tran What should I tell my care team before I take this medication? They need to know if you have any of these conditions: kidney disease liver disease an unusual or allergic reaction to ubrogepant, other medicines, foods, dyes, or preservatives pregnant or trying to get pregnant breast-feeding How should I use this medication? Take this medicine by mouth with a glass of water. Follow the directions on the prescription label. You can take it with or without food. If it upsets your stomach, take it with food. Take your medicine at regular intervals. Do not take it more often than directed. Do not stop taking except on your doctor'sadvice. Talk to your pediatrician about the use of this medicine in children. Specialcare may be needed. Overdosage: If you think you have taken too much of this medicine contact apoison control center or emergency room at once. NOTE: This medicine is only for you. Do not share this medicine with others. What if I miss a dose? This does not apply. This medicine is not for regular use. What may interact with this medication? Do not take this  medicine with any of the following medicines: ceritinib certain antibiotics like chloramphenicol, clarithromycin, telithromycin certain antivirals for HIV like atazanavir, cobicistat, darunavir, delavirdine, fosamprenavir, indinavir, ritonavir certain medicines for fungal infections like itraconazole, ketoconazole, posaconazole, voriconazole conivaptan grapefruit idelalisib mifepristone nefazodone ribociclib This medicine may also interact with the following medications: carvedilol certain medicines for seizures like phenobarbital, phenytoin ciprofloxacin cyclosporine eltrombopag fluconazole fluvoxamine quinidine rifampin [facility]. John's wort verapamil This list may not describe all possible interactions. Give your health care provider a list of all the medicines, herbs, non-prescription drugs, or dietary supplements you use. Also tell them if you smoke, drink alcohol, or use illegaldrugs. Some items may interact with your medicine. What should I watch for while using this medication? Visit your health care professional for regular checks on your progress. Tell your health care professional if your symptoms do not start to get better or ifthey get worse. Your mouth may get dry. Chewing sugarless gum or sucking hard candy and drinking plenty of water may help. Contact your health care professional if theproblem does not go away or is severe. What side effects may I notice from receiving this medication? Side effects that you should report to your doctor or health care professionalas soon as possible: allergic reactions like skin rash, itching or hives; swelling of the face, lips, or tongue Side effects that usually do not require medical attention (report these toyour doctor or health care professional if they continue or are bothersome): drowsiness dry mouth nausea tiredness This list may not describe all possible side effects.  Call your doctor for medical advice about side effects. You  may report side effects to FDA at1-800-FDA-1088. Where should I keep my medication? Keep out of the reach of children. Store at room temperature between 15 and 30 degrees C (59 and 86 degrees F). Throw away any unused medicine after theexpiration date. NOTE: This sheet is a summary. It may not cover all possible information. If you have questions about this medicine, talk to your doctor, pharmacist, orhealth care provider.  2022 Elsevier/Gold Standard (2018-03-10 08:50:55) Migraine Headache A migraine headache is an intense, throbbing pain on one side or both sides of the head. Migraine headaches may also cause other symptoms, such as nausea, vomiting, and sensitivity to light and noise. A migraine headache can last from 4 hours to 3 days. Talk with your doctor about what things may bring on (trigger) your migraine headaches. What are the causes? The exact cause of this condition is not known. However, a migraine may be caused when nerves in the brain become irritated and release chemicals that cause inflammation of blood vessels. This inflammation causes pain. This condition may be triggered or caused by:  High blood pressure. Sleep deprivation. Frequent NSAID intake.  Verapamil Sustained-Release Capsules What is this medication? VERAPAMIL (ver AP a mil) treats high blood pressure and prevents chest pain (angina). It works by relaxing the blood vessels, which helps decrease the amount of work your heart has to do. It belongs to a group of medicationscalled calcium channel blockers. This medicine may be used for other purposes; ask your health care provider orpharmacist if you have questions. COMMON BRAND NAME(S): Verelan, Verelan PM What should I tell my care team before I take this medication? They need to know if you have any of these conditions: Duchenne muscular dystrophy Heart disease Irregular heartbeat or rhythm Liver disease Low blood pressure An unusual or allergic reaction to  verapamil, other medications, foods, dyes or preservatives Pregnant or trying to get pregnant Breast-feeding How should I use this medication? Take this medication by mouth with water. Take it as directed on the prescription label at the same time every day. Do not cut, crush or chew this medication. Swallow the capsules whole. You may open the capsule and put the contents in 1 teaspoon of applesauce. Swallow the medication and applesauce right away. Do not chew the medication or applesauce. Keep taking it unlessyour care team tells you to stop. Do not take this medication with grapefruit juice. Talk to your care team about the use of this medication in children. Specialcare may be needed. Overdosage: If you think you have taken too much of this medicine contact apoison control center or emergency room at once. NOTE: This medicine is only for you. Do not share this medicine with others. What if I miss a dose? If you miss a dose, take it as soon as you can. If it is almost time for yournext dose, take only that dose. Do not take double or extra doses. What may interact with this medication? Do not take this medication with any of the following: Cisapride Disopyramide Dofetilide Grapefruit juice [place] Pimozide Red yeast rice This medication may also interact with the following: Barbiturates such as phenobarbital Cimetidine Cyclosporine Lithium Local anesthetics or general anesthetics Medications for heart rhythm problems like amiodarone, digoxin, flecainide, procainamide, quinidine Medications for high blood pressure or heart problems Medications for seizures like carbamazepine and phenytoin Rifampin, rifabutin or rifapentine Theophylline or aminophylline This list may not describe all possible interactions. Give  your health care provider a list of all the medicines, herbs, non-prescription drugs, or dietary supplements you use. Also tell them if you smoke, drink alcohol, or use  illegaldrugs. Some items may interact with your medicine. What should I watch for while using this medication? Visit your care team for regular checks on your progress. Check your blood pressure as directed. Ask your care team what your blood pressure should be.Also, find out when you should contact them. Do not treat yourself for coughs, colds, or pain while you are using this medication without asking your care team for advice. Some medications mayincrease your blood pressure. You may get drowsy or dizzy. Do not drive, use machinery, or do anything that needs mental alertness until you know how this medication affects you. Do not stand up or sit up quickly, especially if you are an older patient. Thisreduces the risk of dizzy or fainting spells. What side effects may I notice from receiving this medication? Side effects that you should report to your care team as soon as possible: Allergic reactions-skin rash, itching, hives, swelling of the face, lips, tongue, or throat Heart failure-shortness of breath, swelling of the ankles, feet, or hands, sudden weight gain, unusual weakness or fatigue Slow heartbeat-dizziness, feeling faint or lightheaded, trouble breathing, unusual weakness or fatigue Liver injury-right upper belly pain, loss of appetite, nausea, light-colored stool, dark yellow or brown urine, yellowing skin or eyes, unusual weakness or fatigue Low blood pressure-dizziness, feeling faint or lightheaded, blurry vision Side effects that usually do not require medical attention (report to your careteam if they continue or are bothersome): Constipation Dizziness Headache Nausea This list may not describe all possible side effects. Call your doctor for medical advice about side effects. You may report side effects to FDA at1-800-FDA-1088. Where should I keep my medication? Keep out of the reach of children and pets. Store at room temperature between 20 and 25 degrees C (68 and 77 degrees  F). Protect from moisture. Keep the container tightly closed. Avoid exposure toextreme heat. Throw away any unused medication after the expiration date. NOTE: This sheet is a summary. It may not cover all possible information. If you have questions about this medicine, talk to your doctor, pharmacist, orhealth care provider.  2022 Elsevier/Gold Standard (2020-01-02 09:40:28)  Drinking alcohol. Smoking. Taking medicines, such as: Medicine used to treat chest pain (nitroglycerin). Birth control pills. Estrogen. Certain blood pressure medicines. Eating or drinking products that contain nitrates, glutamate, aspartame, or tyramine. Aged cheeses, chocolate, or caffeine may also be triggers. Doing physical activity. Other things that may trigger a migraine headache include: Menstruation. Pregnancy. Hunger. Stress. Lack of sleep or too much sleep. Weather changes. Fatigue. What increases the risk? The following factors may make you more likely to experience migraine headaches: Being a certain age. This condition is more common in people who are 92-50 years old. Being female. Having a family history of migraine headaches. Being Caucasian. Having a mental health condition, such as depression or anxiety. Being obese. What are the signs or symptoms? The main symptom of this condition is pulsating or throbbing pain. This pain may: Happen in any area of the head, such as on one side or both sides. Interfere with daily activities. Get worse with physical activity. Get worse with exposure to bright lights or loud noises. Other symptoms may include: Nausea. Vomiting. Dizziness. General sensitivity to bright lights, loud noises, or smells. Before you get a migraine headache, you may get warning signs (an aura). An aura  may include: Seeing flashing lights or having blind spots. Seeing bright spots, halos, or zigzag lines. Having tunnel vision or blurred vision. Having numbness or a tingling  feeling. Having trouble talking. Having muscle weakness. Some people have symptoms after a migraine headache (postdromal phase), such as: Feeling tired. Difficulty concentrating. How is this diagnosed? A migraine headache can be diagnosed based on: Your symptoms. A physical exam. Tests, such as: CT scan or an MRI of the head. These imaging tests can help rule out other causes of headaches. Taking fluid from the spine (lumbar puncture) and analyzing it (cerebrospinal fluid analysis, or CSF analysis). How is this treated? This condition may be treated with medicines that: Relieve pain. Relieve nausea. Prevent migraine headaches. Treatment for this condition may also include: Acupuncture. Lifestyle changes like avoiding foods that trigger migraine headaches. Biofeedback. Cognitive behavioral therapy. Follow these instructions at home: Medicines Take over-the-counter and prescription medicines only as told by your health care provider. Ask your health care provider if the medicine prescribed to you: Requires you to avoid driving or using heavy machinery. Can cause constipation. You may need to take these actions to prevent or treat constipation: Drink enough fluid to keep your urine pale yellow. Take over-the-counter or prescription medicines. Eat foods that are high in fiber, such as beans, whole grains, and fresh fruits and vegetables. Limit foods that are high in fat and processed sugars, such as fried or sweet foods. Lifestyle Do not drink alcohol. Do not use any products that contain nicotine or tobacco, such as cigarettes, e-cigarettes, and chewing tobacco. If you need help quitting, ask your health care provider. Get at least 8 hours of sleep every night. Find ways to manage stress, such as meditation, deep breathing, or yoga. General instructions     Keep a journal to find out what may trigger your migraine headaches. For example, write down: What you eat and  drink. How much sleep you get. Any change to your diet or medicines. If you have a migraine headache: Avoid things that make your symptoms worse, such as bright lights. It may help to lie down in a dark, quiet room. Do not drive or use heavy machinery. Ask your health care provider what activities are safe for you while you are experiencing symptoms. Keep all follow-up visits as told by your health care provider. This is important. Contact a health care provider if: You develop symptoms that are different or more severe than your usual migraine headache symptoms. You have more than 15 headache days in one month. Get help right away if: Your migraine headache becomes severe. Your migraine headache lasts longer than 72 hours. You have a fever. You have a stiff neck. You have vision loss. Your muscles feel weak or like you cannot control them. You start to lose your balance often. You have trouble walking. You faint. You have a seizure. Summary A migraine headache is an intense, throbbing pain on one side or both sides of the head. Migraines may also cause other symptoms, such as nausea, vomiting, and sensitivity to light and noise. This condition may be treated with medicines and lifestyle changes. You may also need to avoid certain things that trigger a migraine headache. Keep a journal to find out what may trigger your migraine headaches. Contact your health care provider if you have more than 15 headache days in a month or you develop symptoms that are different or more severe than your usual migraine headache symptoms. This information is not intended  to replace advice given to you by your health care provider. Make sure you discuss any questions you have with your healthcare provider. Document Revised: 04/15/2018 Document Reviewed: 02/03/2018 Else[employer] Patient Education  [employer].

## 2020-08-15 NOTE — Progress Notes (Signed)
[facility]  Provider:  Larey Seat, M D  Referring Provider: Cari Caraway, MD Primary Care Physician:  Cari Caraway, MD  Chief Complaint  Patient presents with   New Patient (Initial Visit)    RM 10, alone. Paper referral from Theadore Nan, MD for headaches.   Gait Problem    Ambulates with rolling walker. Has to grab wall in apartment to avoid falling.  Loses balance often.     HPI:  Yvonne Tran is a 86 y.o. female of african -american descent and seen here  on 08-15-2020 as a new headache patient upon Consultation requested from [place], Dr. Addison Lank for " persistent headaches due to head injury" (G 44.309).  Has been having headaches since a reported concussion in 2021.The patient reports that she found herself on the floor next to her bed,  she was not sure how long she may have had her surgery as it was at the was loss of awareness.  She was bleeding from the left side of her forehead and her face was swollen.  The patient reports that she found herself on the floor next to her bed she was not sure how long she may have had her surgery as it was at the was loss of awareness.  She was bleeding from the left side of her forehead and her face was swollen.  She needed medical attention and present to the ER, [place].   Getting daily headaches, on and off. She feels a need to sit down or lay own. Always at the spot of the previous bleed. She gets nauseated.  She is legally blind and has been ambulation impaired for  a long time, multiple factors play a role, independent of this fall. Cervical myelopathy, spinal stenosis and claudication PAD.  Gets dizzy from headaches and she has to sit down.     CC: Has been having headaches since concussion in 2021. Getting daily headaches. Gets dizzy from headaches and she has to sit down.   There is poorly documented medical history in EPIC.   She lives alone,wants to be independent.    Review of Systems: Out  of a complete 14 system review, the patient complains of only the following symptoms, and all other reviewed systems are negative. See above.   Social History   Socioeconomic History   Marital status: Widowed    Spouse name: Not on file   Number of children: 0   Years of education: college   Highest education level: Not on file  Occupational History   Not on file  Tobacco Use   Smoking status: Never   Smokeless tobacco: Never  Vaping Use   Vaping Use: Never used  Substance and Sexual Activity   Alcohol use: No   Drug use: No   Sexual activity: Not on file  Other Topics Concern   Not on file  Social History Narrative   Caffeine use: none   Lives alone   Right handed    Social Determinants of Health   Financial Resource Strain: Not on file  Food Insecurity: Not on file  Transportation Needs: Not on file  Physical Activity: Not on file  Stress: Not on file  Social Connections: Not on file  Intimate Partner Violence: Not on file    Family History  Problem Relation Age of Onset   Cancer Mother        brain   Stroke Mother    Heart disease Father  Heart attack Father    Hypertension Father    Cancer Brother        prostate   Anesthesia problems Neg Hx    Hypotension Neg Hx    Malignant hyperthermia Neg Hx    Pseudochol deficiency Neg Hx    Breast cancer Neg Hx     Past Medical History:  Diagnosis Date   Chest pain with normal coronary angiography 2009, 2012   DVT (deep venous thrombosis) ([place])    a. LLE 09/2018 following admission for fall.   Gait multifocal / multi origin.     Glaucoma Blind.     GLAUCOMA IN   BOTH EYES:  LEGALLY BLIND IN RIGHT EYE   Goiter    THROID GOITER WITH COMPRESSION--PT HAVING TROUBLE SWALLOWING AND SOB   Headache(784.0) ???    LEFT TEMPORAL AREA   Hyperlipidemia    Hypertension    Lipoma            Claudication, vascular and nerve?    RIGHT THIGH FOR QUITE SOME TIME--LEG GIVES AWAY SOMETIMES.  LOWER BACK PAIN  - S/P HX  OF PREVIOUS BACK SURGERY   Palpitations    a. event monitor 02/2019 NSR with rare PACs, PVCs.   Peripheral vascular disease (HCC)PAD     09/2008 STENT LEFT EXTERNAL ILIAC;  03/20/10 RE-STENT OF LEFT EXTERNAL ILIAC STENT- AT [facility] WITH DR. VARANASI   Pulmonary hypertension (HCC)    Pulsatile tinnitus        MINI STROKE  YRS AGO     Past Surgical History:  Procedure Laterality Date   APPENDECTOMY     BACK SURGERY  1988   PT STATES AFTER THE BACK SURGERY PT HAS PARALYSIS LEFT ARM AND LEG--TOOK 2&1/2 YEARS TO REGAIN USE OF LEFT SIDE--LEFT SIDE REMAINS WEAKER THAN HER RIFHT SIDE.   CARDIAC CATHETERIZATION  2012   EYE SURGERY     left eye, lens replacement   MASS EXCISION  01/13/2011   Procedure: EXCISION MASS;  Surgeon: Stark Klein, MD;  Location: [place];  Service: General;  Laterality: Left;  Left abdominal wall mass (subcutaneous, 4-5 cm).   THYROIDECTOMY  08/14/2011   Procedure: THYROIDECTOMY;  Surgeon: Earnstine Regal, MD;  Location: [facility];  Service: General;  Laterality: N/A;  Total Thyroidectomy   VASCULAR SURGERY     STENT PLACED 03/2010 RT LEG    Current Outpatient Medications  Medication Sig Dispense Refill   acetaminophen (TYLENOL) 500 MG tablet Take 2 tablets (1,000 mg total) by mouth every 8 (eight) hours as needed for moderate pain. 30 tablet 0   amLODipine (NORVASC) 10 MG tablet Take 1 tablet (10 mg total) by mouth daily. 30 tablet 3   aspirin EC 81 MG tablet Take 1 tablet (81 mg total) by mouth daily. 90 tablet 3   BIOTIN PO Take 1 tablet by mouth daily.     COMBIGAN 0.2-0.5 % ophthalmic solution Place 1 drop into the right eye 2 (two) times daily.     docusate sodium (COLACE) 100 MG capsule Take 1 capsule (100 mg total) by mouth 2 (two) times daily. Also available over the counter (Patient taking differently: Take 100 mg by mouth daily.) 60 capsule 3   levothyroxine (SYNTHROID, LEVOTHROID) 88 MCG tablet Take 88 mcg by mouth daily before breakfast.     LUMIGAN 0.01 % SOLN Place 1  drop into the right eye at bedtime.     Multiple Vitamins-Minerals (CENTRUM SILVER 50+WOMEN) TABS Take 1 tablet by mouth  daily.     nitroGLYCERIN (NITROSTAT) 0.4 MG SL tablet Place 1 tablet (0.4 mg total) under the tongue every 5 (five) minutes as needed. For chest pain. 25 tablet 3   omeprazole (PRILOSEC) 20 MG capsule Take 1 capsule (20 mg total) by mouth daily. 30 capsule 0   vitamin B-12 (CYANOCOBALAMIN) 100 MCG tablet Take 100 mcg by mouth daily.     ezetimibe (ZETIA) 10 MG tablet Take 1 tablet (10 mg total) by mouth daily. 90 tablet 3   No current facility-administered medications for this visit.    Allergies as of 08/15/2020 - Review Complete 08/15/2020  Allergen Reaction Noted   Crestor [rosuvastatin]  08/06/2011   Eggs or egg-derived products Itching, Nausea Only, and Swelling 11/10/2010   Lemon juice Itching, Nausea Only, and Swelling 11/10/2010   Milk-related compounds Itching, Nausea Only, and Swelling 11/10/2010   Repatha [evolocumab]  08/12/2018   Shellfish allergy Itching, Nausea Only, and Swelling 11/10/2010    Vitals: BP (!) 179/83 (BP Location: Left Arm, Patient Position: Sitting, Cuff Size: Normal)   Pulse 68   Ht '5\' 5"'$  (1.651 m)   Wt 201 lb (91.2 kg)   BMI 33.45 kg/m  Last Weight:  Wt Readings from Last 1 Encounters:  08/15/20 201 lb (91.2 kg)   Last Height:   Ht Readings from Last 1 Encounters:  08/15/20 '5\' 5"'$  (1.651 m)    Physical exam:  General: The patient is awake, alert and appears not in acute distress.  The patient is well groomed. Head: Normocephalic, atraumatic. Neck is supple. Mallampati 2,  Cardiovascular:  Regular rate and rhythm, without murmurs or carotid bruit, and without distended neck veins. Respiratory: Lungs are clear to auscultation. Skin:  With mild ankle edema, or rash Trunk: BMI is 33.  Neurologic exam : The patient is awake and alert, oriented to place and time.  Memory subjective  described as intact. There is a normal  attention span & concentration ability. Speech is fluent without  dysarthria, dysphonia or aphasia. Mood and affect are appropriate.  Cranial nerves: Pupils: The right pupil is just rounded stays enlarged and reveals a cloudy lens.  The patient is legally blind in her right eye.  The right eye is appears protruding.  The left eye has a visible at 2:00 in the iris, the pupil still constricts with direct light.  The patient's right eye is not moving in conjunction with the left and at baseline seems to point to the lower right outward.  Streptozyme is acquired according to the  Visual fields by finger perimetry are impaired.  Cloudiness.  Hearing to finger rub intact.   Facial sensation intact to fine touch. Facial motor strength is symmetric and tongue and uvula move midline. Tongue protrusion into either cheek is normal. Shoulder shrug is normal.   Motor exam:   Normal tone ,muscle bulk and symmetric  strength in both upper extremities.  She has reported loss of grip strength and is right-hand dominant.  There is a atrophic thenar eminence noted which would be often seen with carpal tunnel. ( However ,given her cervical spine history cervical cord injury this can be seen at different levels of impairment.)  Sensory:  Fine touch, pinprick and vibration were tested in all extremities.  The patient could not feel vibration fine touch or pressure on her left ankle, left hallux.  She had a trace of sensation on her right ankle she was able to feel the vibratory sensation on both knees and in  both hands as well.  Coordination: deferred.  Gait and station: deferred. Patient here for headaches.   Deep tendon reflexes: in the  upper and lower extremities were trace only.   Babinski maneuver response deferred.  Last Imagine : vSyncopal episode. Headache.  EXAM: CT HEAD WITHOUT CONTRAST  TECHNIQUE: Contiguous axial images were obtained from the base of the skull through the vertex without intravenous  contrast.  COMPARISON: Head CT 09/17/2018  FINDINGS: Brain: The ventricles are in the midline without mass effect or shift. They are normal in size and configuration for age. No extra-axial fluid collections are identified. No CT findings for hemispheric infarction or intracranial hemorrhage. Stable benign-appearing bilateral basal ganglia calcifications. Brainstem and cerebellum are unremarkable and stable. No intracranial mass lesions.  Vascular: Stable vascular calcifications. No aneurysm or hyperdense vessels.  Skull: No skull fracture or bone lesions. Stable changes of hyperostosis frontalis interna.  Sinuses/Orbits: The paranasal sinuses and mastoid air cells are clear. Globes are intact.  Other: No subcutaneous hematoma.  IMPRESSION: No acute intracranial findings or mass lesions.   Electronically Signed By: Marijo Sanes M.D. On: 04/16/2020 13:36  Assessment:  After physical and neurologic examination, review of laboratory studies, imaging, neurophysiology testing and pre-existing records, assessment is that of :    Post traumatic headaches after a fall, patient hit the bedside table at corner.  This fall occurred in the year 2020.  At the time she presented as an 86 year old female with a known history of arterial peripheral disease, had already iliac artery stents, has coronary artery disease, multinodular goiter, has a central cervical cord lesion, known lower back degenerative lesion, peripheral and distal vascular claudication in both lower extremities, possible neurogenic claudication in lower extremities.  At the time she reported severe pain in her upper extremities which is no longer present.  A CT of the head showed scalp hematoma over the left frontal bone but no intracranial abnormality.  She does now state that she has headaches ever since these are somewhat migrainous, the headaches are throbbing and pulsating and occurred over the area of the injury.  Again  this is now 86 years old.  She gets nauseated sometimes from these headaches which is a classic migrainous symptom.  Bright light can bother her and makes her headaches worse.  She reports less phonophobia than photophobia she is followed by Dr. Brion Aliment for her spinal cord disorder.  She has seen rehab physicians.  She has been in pain therapy.  She has cardiovascular health providers.  It is not unusual to develop posttraumatic migraines I would treat her disorder as such.  Given that she has multiple vascular risk factors I would not use a triptan for these headaches as it would be a vasoconstricting agent.  I also do not want to use a preventative medication that affects her heart rate.  Her blood pressure today was 179/83 and I think this vascular headache may also be provoked by high blood pressure.  It would also fit with a report of pulsating tinnitus. So for acute migraines I would like to prescribe Roselyn Meier which is not Vasotonic.  For migraine prevention and given her high blood pressure I can either use a calcium channel blocker or beta-blocker. She has no asthma history, no kidney stones.  Plan:  Treatment plan and additional workup :  Ubrelvy.: Prn for acute headaches.   Verapamil: start as a preventive at night 120 mg. You take this medication at night and stay on AMLODIPINE in AM.  Your headaches are also Hypertension related, causing vertigo/ tinnitus with high BP.  Migraine symptoms   MRI not needed as Headaches have been present since before last imaging study.   Follow up prn with Headache clinic at [facility]- NP or MD    Larey Seat MD 08/15/2020

## 2020-08-20 ENCOUNTER — Telehealth: Payer: Self-pay

## 2020-08-20 NOTE — Telephone Encounter (Signed)
I have submitted a PA for Ubrelvy '100mg'$  #30/90 days on [facility], Key: [id number].   Awaiting determination from [employer].

## 2020-08-21 ENCOUNTER — Encounter: Payer: Self-pay | Admitting: Neurology

## 2020-08-21 DIAGNOSIS — G43709 Chronic migraine without aura, not intractable, without status migrainosus: Secondary | ICD-10-CM | POA: Insufficient documentation

## 2020-08-21 NOTE — Telephone Encounter (Signed)
Already spoke w/ MD this am. We are going to appeal this. Pending MD signature and then will fax appeal.

## 2020-08-21 NOTE — Telephone Encounter (Signed)
PA Case: [id number], Status: Denied. No further details given.

## 2020-08-21 NOTE — Telephone Encounter (Signed)
Per CB,RN, she faxed appeal letter already. Pending determination w/ insurance.

## 2020-08-22 NOTE — Telephone Encounter (Signed)
PA appeal was reviewed and approved for the patient until 08/21/2021 through [employer]

## 2020-08-29 ENCOUNTER — Other Ambulatory Visit: Payer: Self-pay | Admitting: Cardiovascular Disease

## 2020-09-04 DIAGNOSIS — G8929 Other chronic pain: Secondary | ICD-10-CM | POA: Diagnosis not present

## 2020-09-04 DIAGNOSIS — I10 Essential (primary) hypertension: Secondary | ICD-10-CM | POA: Diagnosis not present

## 2020-09-04 DIAGNOSIS — H409 Unspecified glaucoma: Secondary | ICD-10-CM | POA: Diagnosis not present

## 2020-09-04 DIAGNOSIS — I25119 Atherosclerotic heart disease of native coronary artery with unspecified angina pectoris: Secondary | ICD-10-CM | POA: Diagnosis not present

## 2020-09-04 DIAGNOSIS — E039 Hypothyroidism, unspecified: Secondary | ICD-10-CM | POA: Diagnosis not present

## 2020-09-04 DIAGNOSIS — E785 Hyperlipidemia, unspecified: Secondary | ICD-10-CM | POA: Diagnosis not present

## 2020-09-04 DIAGNOSIS — I201 Angina pectoris with documented spasm: Secondary | ICD-10-CM | POA: Diagnosis not present

## 2020-09-05 DIAGNOSIS — S14125S Central cord syndrome at C5 level of cervical spinal cord, sequela: Secondary | ICD-10-CM | POA: Diagnosis not present

## 2020-09-05 DIAGNOSIS — I10 Essential (primary) hypertension: Secondary | ICD-10-CM | POA: Diagnosis not present

## 2020-09-05 DIAGNOSIS — E785 Hyperlipidemia, unspecified: Secondary | ICD-10-CM | POA: Diagnosis not present

## 2020-09-05 DIAGNOSIS — R262 Difficulty in walking, not elsewhere classified: Secondary | ICD-10-CM | POA: Diagnosis not present

## 2020-09-05 DIAGNOSIS — H409 Unspecified glaucoma: Secondary | ICD-10-CM | POA: Diagnosis not present

## 2020-09-05 DIAGNOSIS — I25119 Atherosclerotic heart disease of native coronary artery with unspecified angina pectoris: Secondary | ICD-10-CM | POA: Diagnosis not present

## 2020-09-05 DIAGNOSIS — M1909 Primary osteoarthritis, other specified site: Secondary | ICD-10-CM | POA: Diagnosis not present

## 2020-09-05 DIAGNOSIS — E039 Hypothyroidism, unspecified: Secondary | ICD-10-CM | POA: Diagnosis not present

## 2020-09-05 DIAGNOSIS — R7989 Other specified abnormal findings of blood chemistry: Secondary | ICD-10-CM | POA: Diagnosis not present

## 2020-10-04 DIAGNOSIS — I10 Essential (primary) hypertension: Secondary | ICD-10-CM | POA: Diagnosis not present

## 2020-10-31 DIAGNOSIS — Z20822 Contact with and (suspected) exposure to covid-19: Secondary | ICD-10-CM | POA: Diagnosis not present

## 2020-10-31 DIAGNOSIS — I82411 Acute embolism and thrombosis of right femoral vein: Secondary | ICD-10-CM | POA: Diagnosis not present

## 2020-10-31 DIAGNOSIS — E785 Hyperlipidemia, unspecified: Secondary | ICD-10-CM | POA: Diagnosis not present

## 2020-10-31 DIAGNOSIS — M79606 Pain in leg, unspecified: Secondary | ICD-10-CM | POA: Diagnosis not present

## 2020-10-31 DIAGNOSIS — R531 Weakness: Secondary | ICD-10-CM | POA: Diagnosis not present

## 2020-10-31 DIAGNOSIS — E039 Hypothyroidism, unspecified: Secondary | ICD-10-CM | POA: Diagnosis not present

## 2020-10-31 DIAGNOSIS — I509 Heart failure, unspecified: Secondary | ICD-10-CM | POA: Diagnosis not present

## 2020-10-31 DIAGNOSIS — Z7982 Long term (current) use of aspirin: Secondary | ICD-10-CM | POA: Diagnosis not present

## 2020-10-31 DIAGNOSIS — I82409 Acute embolism and thrombosis of unspecified deep veins of unspecified lower extremity: Secondary | ICD-10-CM | POA: Diagnosis not present

## 2020-10-31 DIAGNOSIS — I110 Hypertensive heart disease with heart failure: Secondary | ICD-10-CM | POA: Diagnosis not present

## 2020-10-31 DIAGNOSIS — Z8673 Personal history of transient ischemic attack (TIA), and cerebral infarction without residual deficits: Secondary | ICD-10-CM | POA: Diagnosis not present

## 2020-10-31 DIAGNOSIS — Z86718 Personal history of other venous thrombosis and embolism: Secondary | ICD-10-CM | POA: Diagnosis not present

## 2020-10-31 DIAGNOSIS — Z9181 History of falling: Secondary | ICD-10-CM | POA: Diagnosis not present

## 2020-10-31 DIAGNOSIS — I10 Essential (primary) hypertension: Secondary | ICD-10-CM | POA: Diagnosis not present

## 2020-10-31 DIAGNOSIS — W19XXXA Unspecified fall, initial encounter: Secondary | ICD-10-CM | POA: Diagnosis not present

## 2020-11-02 DIAGNOSIS — Z86718 Personal history of other venous thrombosis and embolism: Secondary | ICD-10-CM | POA: Diagnosis not present

## 2020-11-02 DIAGNOSIS — W19XXXA Unspecified fall, initial encounter: Secondary | ICD-10-CM | POA: Diagnosis not present

## 2020-11-02 DIAGNOSIS — M79606 Pain in leg, unspecified: Secondary | ICD-10-CM | POA: Diagnosis not present

## 2020-11-02 DIAGNOSIS — R531 Weakness: Secondary | ICD-10-CM | POA: Diagnosis not present

## 2020-11-18 NOTE — Progress Notes (Deleted)
Cardiology Office Note   Date:  11/18/2020   ID:  [name], DOB 10/10/1934, MRN [id number]  PCP:  Cari Caraway, MD    No chief complaint on file.  PAD  Wt Readings from Last 3 Encounters:  08/15/20 201 lb (91.2 kg)  05/20/20 205 lb 3.2 oz (93.1 kg)  04/16/20 204 lb (92.5 kg)       History of Present Illness: Yvonne Tran is a 86 y.o. female   with PAD. She has had left iliac stents placed in the past.   Her walking is limited by balance problems and now uses a cane, or rolling walker.     Cardiac  Caths in 2009 and 2012 were clear of any significant CAD.   She is limited by back pain.     She reported trouble swallowing since her thyroid operation in the past.  It improved, but still occasionally causes problems.  Was Better with drinking water.   In the past, she noted the following two problems.: She can hear her heartbeat in her right ear, and describes it as a "thunderous noise."   Left leg burning and itching, anteriorly.     Negative stress test in 2019.    Went to ER for chest pain in 4/22: "had a generalized headache for a number of months.  She had a fall and got diagnosed with a concussion.  Ever since that time she has been having a generalized headache.  She does not really take anything to try to treat it.  She has a follow-up with her neurologist at the beginning of May for reassessment.  She does not have any specific weakness numbness or tingling.  The patient reports that she had her typical headache but then today also felt like she got some chest pressure.  This occurred at about 8 AM.  She took her blood pressure medication and then took 2 nitroglycerin.  She reports she sat down at the table and then got very lightheaded and passed out and found herself on the floor.  She then awakened and was aware of her surroundings.  She did not awaken with any focal weakness numbness or tingling of the extremities." Plan was :  "Patient does describe getting a sense of chest pressure and then taking nitroglycerin this morning.  I suspect in combination of blood pressure medication and nitroglycerin, patient became significantly orthostatic hypotensive, precipitating syncopal episode.  CT head obtained to rule out any injury associated with patient's fall or subacute subdural hematoma.  No acute findings on CT scan.  Patient is neurologically intact.  At this time, I do not suspect ongoing unstable angina.  Patient's chest pain is resolved.  No ischemic changes acutely on her EKG.  Troponins are negative.  I do think patient is stable to follow-up with cardiology and her PCP."      Past Medical History:  Diagnosis Date   Chest pain with normal coronary angiography 2009, 2012   DVT (deep venous thrombosis) ([place])    a. LLE 09/2018 following admission for fall.   Gait instability    Glaucoma    GLAUCOMA IN   BOTH EYES:  LEGALLY BLIND IN RIGHT EYE   Goiter    THROID GOITER WITH COMPRESSION--PT HAVING TROUBLE SWALLOWING AND SOB   Headache(784.0)    LEFT TEMPORAL AREA   Hyperlipidemia    Hypertension    Lipoma    Orthostasis    PAD (peripheral artery disease) ([place])  Pain    RIGHT THIGH FOR QUITE SOME TIME--LEG GIVES AWAY SOMETIMES.  LOWER BACK PAIN  - S/P HX OF PREVIOUS BACK SURGERY   Palpitations    a. event monitor 02/2019 NSR with rare PACs, PVCs.   Peripheral vascular disease ([place])    09/2008 STENT LEFT EXTERNAL ILIAC;  03/20/10 RE-STENT OF LEFT EXTERNAL ILIAC STENT- [facility] WITH DR. Arcenia Scarbro   Pulmonary hypertension (HCC)    Pulsatile tinnitus    Stroke ([place])    MINI STROKE  YRS AGO     Past Surgical History:  Procedure Laterality Date   APPENDECTOMY     BACK SURGERY  1988   PT STATES AFTER THE BACK SURGERY PT HAS PARALYSIS LEFT ARM AND LEG--TOOK 2&1/2 YEARS TO REGAIN USE OF LEFT SIDE--LEFT SIDE REMAINS WEAKER THAN HER RIFHT SIDE.   CARDIAC CATHETERIZATION  2012   EYE SURGERY     left eye, lens  replacement   MASS EXCISION  01/13/2011   Procedure: EXCISION MASS;  Surgeon: Stark Klein, MD;  Location: [place];  Service: General;  Laterality: Left;  Left abdominal wall mass (subcutaneous, 4-5 cm).   THYROIDECTOMY  08/14/2011   Procedure: THYROIDECTOMY;  Surgeon: Earnstine Regal, MD;  Location: [facility];  Service: General;  Laterality: N/A;  Total Thyroidectomy   VASCULAR SURGERY     STENT PLACED 03/2010 RT LEG     Current Outpatient Medications  Medication Sig Dispense Refill   acetaminophen (TYLENOL) 500 MG tablet Take 2 tablets (1,000 mg total) by mouth every 8 (eight) hours as needed for moderate pain. 30 tablet 0   amLODipine (NORVASC) 10 MG tablet Take 1 tablet (10 mg total) by mouth daily. 30 tablet 3   aspirin EC 81 MG tablet Take 1 tablet (81 mg total) by mouth daily. 90 tablet 3   BIOTIN PO Take 1 tablet by mouth daily.     COMBIGAN 0.2-0.5 % ophthalmic solution Place 1 drop into the right eye 2 (two) times daily.     docusate sodium (COLACE) 100 MG capsule Take 1 capsule (100 mg total) by mouth 2 (two) times daily. Also available over the counter (Patient taking differently: Take 100 mg by mouth daily.) 60 capsule 3   ezetimibe (ZETIA) 10 MG tablet TAKE ONE TABLET BY MOUTH AT NOON 90 tablet 1   levothyroxine (SYNTHROID, LEVOTHROID) 88 MCG tablet Take 88 mcg by mouth daily before breakfast.     LUMIGAN 0.01 % SOLN Place 1 drop into the right eye at bedtime.     Multiple Vitamins-Minerals (CENTRUM SILVER 50+WOMEN) TABS Take 1 tablet by mouth daily.     nitroGLYCERIN (NITROSTAT) 0.4 MG SL tablet Place 1 tablet (0.4 mg total) under the tongue every 5 (five) minutes as needed. For chest pain. 25 tablet 3   omeprazole (PRILOSEC) 20 MG capsule Take 1 capsule (20 mg total) by mouth daily. 30 capsule 0   Ubrogepant (UBRELVY) 100 MG TABS Prn for migraine headaches. 30 tablet 1   verapamil (CALAN-SR) 120 MG CR tablet Take 1 tablet (120 mg total) by mouth at bedtime. 30 tablet 5   vitamin B-12  (CYANOCOBALAMIN) 100 MCG tablet Take 100 mcg by mouth daily.     No current facility-administered medications for this visit.    Allergies:   Crestor [rosuvastatin], Eggs or egg-derived products, Lemon juice, Milk-related compounds, Repatha [evolocumab], and Shellfish allergy    Social History:  The patient  reports that she has never smoked. She has never used smokeless  tobacco. She reports that she does not drink alcohol and does not use drugs.   Family History:  The patient's ***family history includes Cancer in her brother and mother; Heart attack in her father; Heart disease in her father; Hypertension in her father; Stroke in her mother.    ROS:  Please see the history of present illness.   Otherwise, review of systems are positive for ***.   All other systems are reviewed and negative.    PHYSICAL EXAM: VS:  There were no vitals taken for this visit. , BMI There is no height or weight on file to calculate BMI. GEN: Well nourished, well developed, in no acute distress HEENT: normal Neck: no JVD, carotid bruits, or masses Cardiac: ***RRR; no murmurs, rubs, or gallops,no edema  Respiratory:  clear to auscultation bilaterally, normal work of breathing GI: soft, nontender, nondistended, + BS MS: no deformity or atrophy Skin: warm and dry, no rash Neuro:  Strength and sensation are intact Psych: euthymic mood, full affect   EKG:   The ekg ordered today demonstrates ***   Recent Labs: 12/14/2019: ALT 14 04/16/2020: BUN 16; Creatinine, Ser 0.83; Hemoglobin 12.7; Platelets 230; Potassium 3.9; Sodium 145   Lipid Panel    Component Value Date/Time   CHOL 251 (H) 12/14/2019 1140   TRIG 65 12/14/2019 1140   HDL 60 12/14/2019 1140   CHOLHDL 4.2 12/14/2019 1140   CHOLHDL 4 05/15/2014 0947   VLDL 13.6 05/15/2014 0947   LDLCALC 180 (H) 12/14/2019 1140   LDLDIRECT 196.5 12/12/2012 1104     Other studies Reviewed: Additional studies/ records that were reviewed today with results  demonstrating: ***.   ASSESSMENT AND PLAN:  PAD/aortic atherosclerosis: Status post left external iliac stents in 2012.  Normal ABIs in 05/2020.  Follows with Dr. Fletcher Anon. Persistent chest pain: Negative work-up in the past. Hypertension: Hyperlipidemia: She has been statin intolerant.  She has been intolerant of Repatha. Balance issues: In the past, she fell off her bed and had a concussion leading to persistent headaches.   Current medicines are reviewed at length with the patient today.  The patient concerns regarding her medicines were addressed.  The following changes have been made:  No change***  Labs/ tests ordered today include: *** No orders of the defined types were placed in this encounter.   Recommend 150 minutes/week of aerobic exercise Low fat, low carb, high fiber diet recommended  Disposition:   FU in ***   Signed, Larae Grooms, MD  11/18/2020 10:26 AM    [facility] [address], [place], [place]  [address] Phone: [phone]; Fax: [phone]

## 2020-11-19 ENCOUNTER — Ambulatory Visit: Payer: PPO | Admitting: Interventional Cardiology

## 2020-11-19 DIAGNOSIS — Z789 Other specified health status: Secondary | ICD-10-CM

## 2020-11-19 DIAGNOSIS — I10 Essential (primary) hypertension: Secondary | ICD-10-CM

## 2020-11-19 DIAGNOSIS — E782 Mixed hyperlipidemia: Secondary | ICD-10-CM

## 2020-11-19 DIAGNOSIS — I739 Peripheral vascular disease, unspecified: Secondary | ICD-10-CM

## 2020-11-19 DIAGNOSIS — I700 Atherosclerosis of aorta: Secondary | ICD-10-CM

## 2020-11-19 DIAGNOSIS — Z95828 Presence of other vascular implants and grafts: Secondary | ICD-10-CM

## 2021-02-19 ENCOUNTER — Encounter: Payer: Self-pay | Admitting: Adult Health

## 2021-02-19 ENCOUNTER — Ambulatory Visit: Payer: PPO | Admitting: Adult Health

## 2021-05-03 IMAGING — CT CT ANGIO CHEST
2 of 6 series · 18 of 36 positions shown · IV contrast (omnipaque)
Comparison: None.

CLINICAL DATA: Chest pain, elevated D-dimer

EXAM:
CT ANGIOGRAPHY CHEST WITH CONTRAST
TECHNIQUE: Multidetector CT imaging of the chest was performed using the
standard protocol during bolus administration of intravenous
contrast. Multiplanar CT image reconstructions and MIPs were
obtained to evaluate the vascular anatomy.
CONTRAST:  100mL OMNIPAQUE IOHEXOL 350 MG/ML SOLN

[Series 5: thins · axial · 0.67mm/px · z∈[+1417,+1649]mm · 17 of 262 slices shown]
[im 15/262  lung]
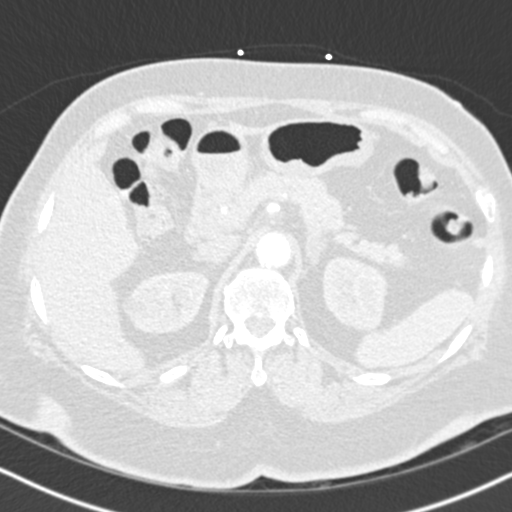
[im 30/262  mediastinal]
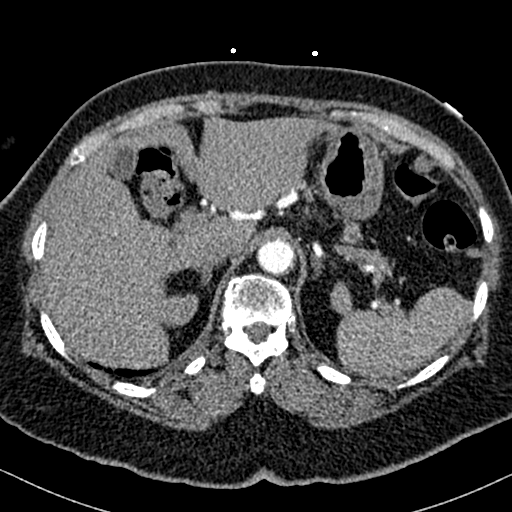
[im 44/262  lung]
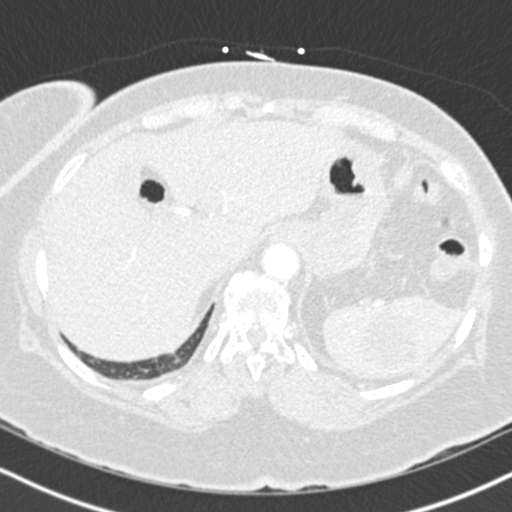
[im 59/262  mediastinal]
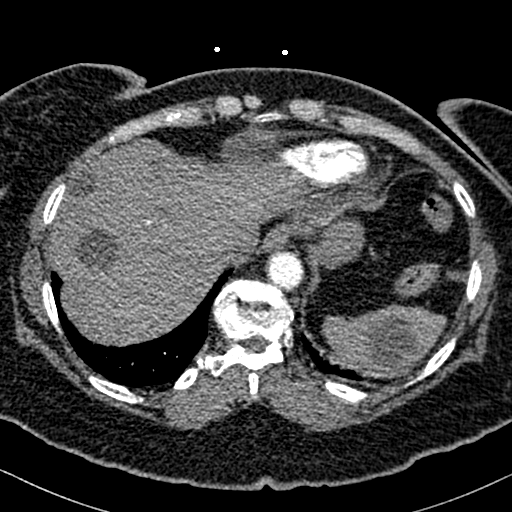
[im 73/262  lung]
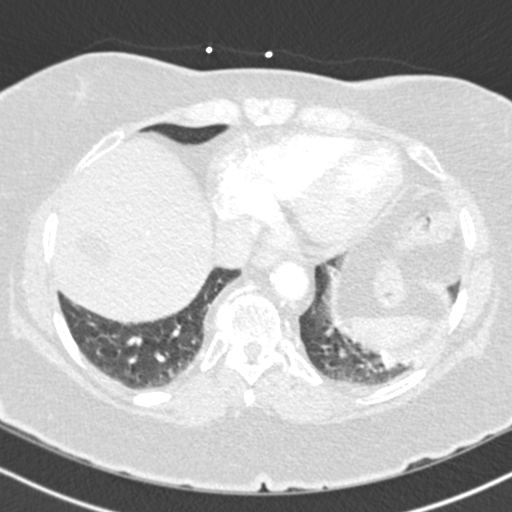
[im 88/262  mediastinal]
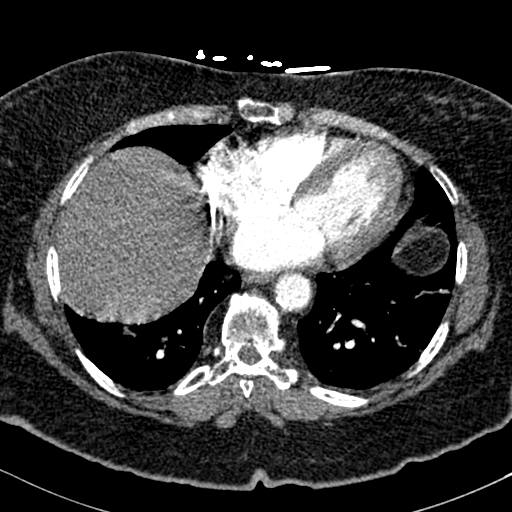
[im 102/262  lung]
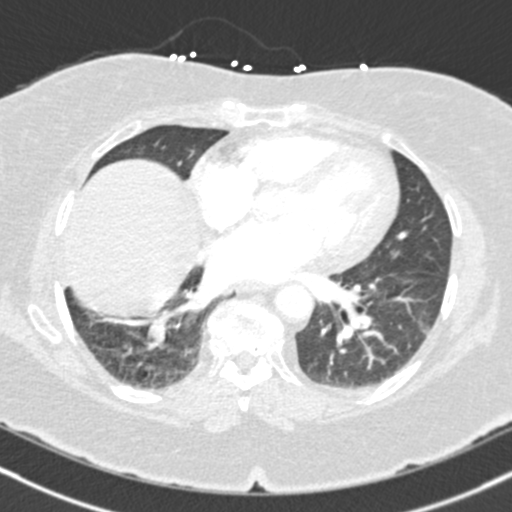
[im 117/262  mediastinal]
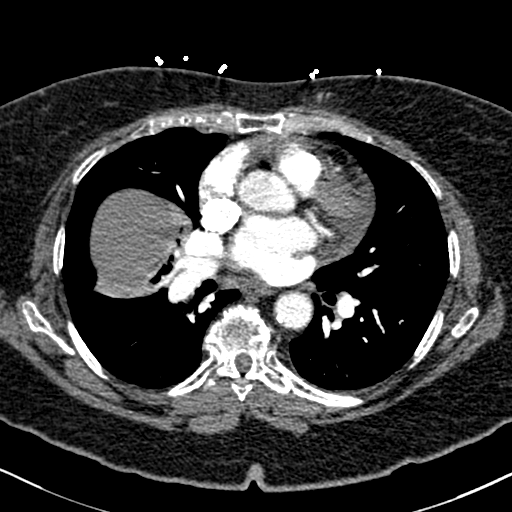
[im 131/262  lung]
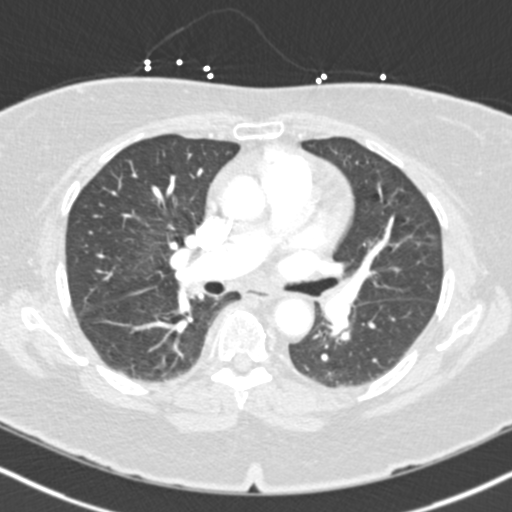
[im 146/262  mediastinal]
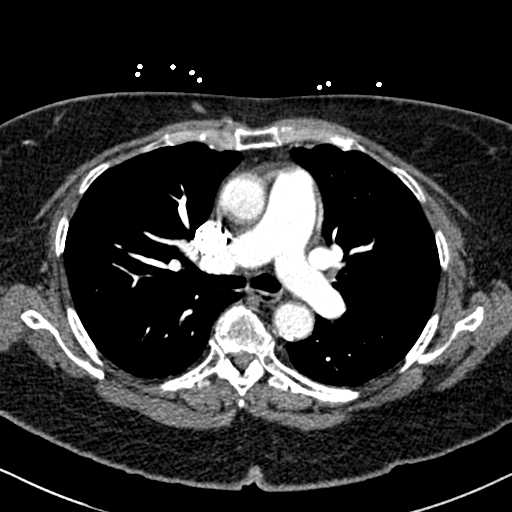
[im 160/262  lung]
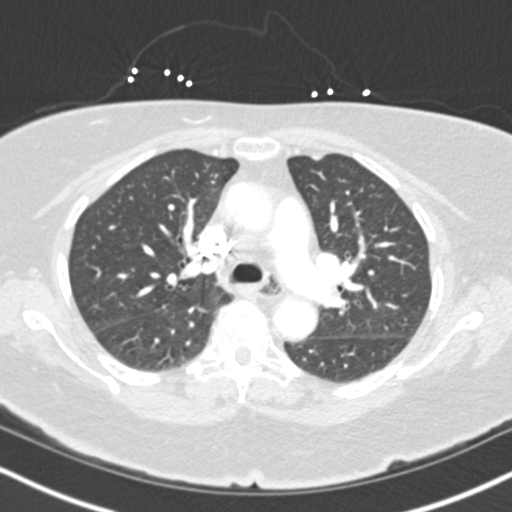
[im 175/262  mediastinal]
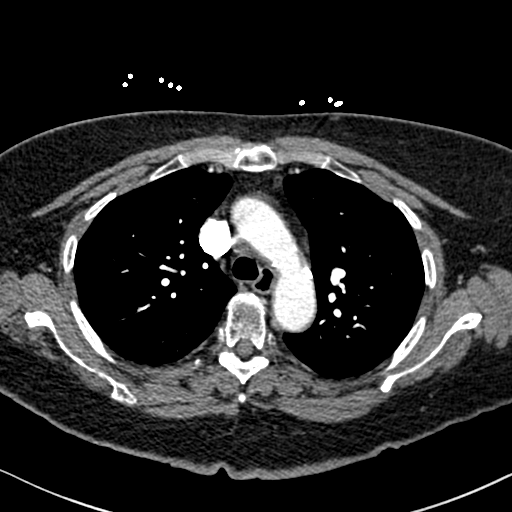
[im 189/262  lung]
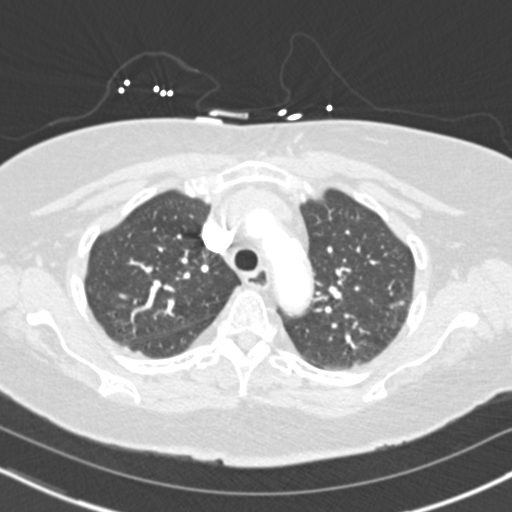
[im 204/262  mediastinal]
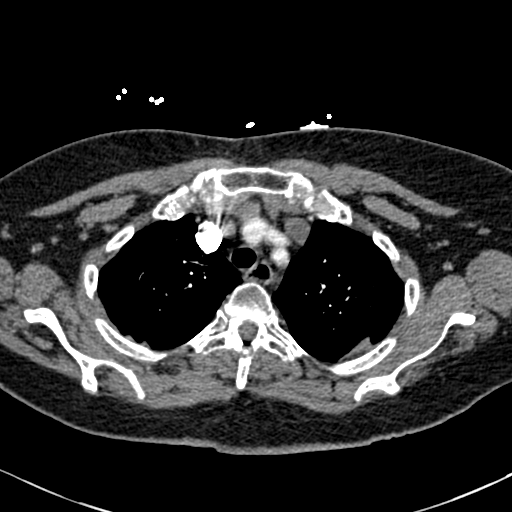
[im 218/262  lung]
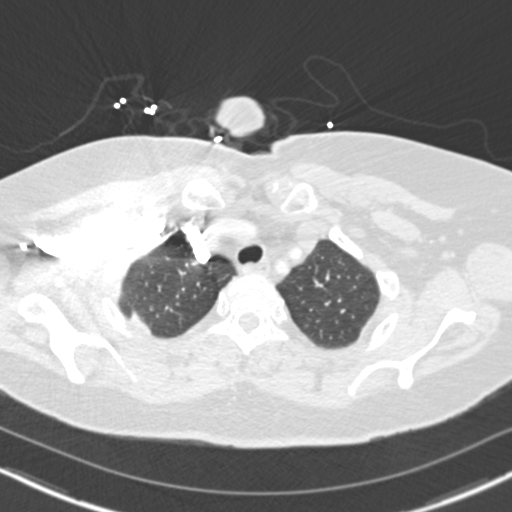
[im 233/262  mediastinal]
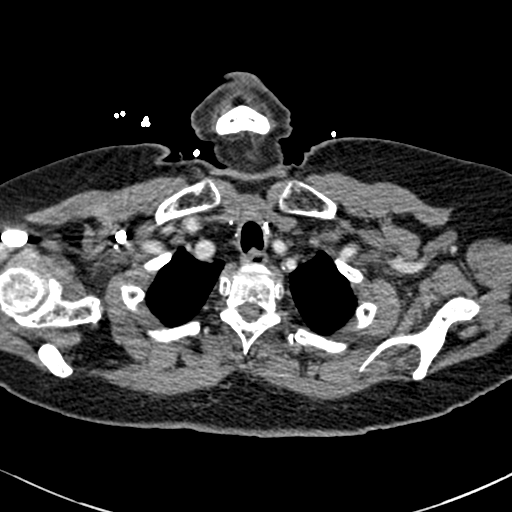
[im 247/262  lung]
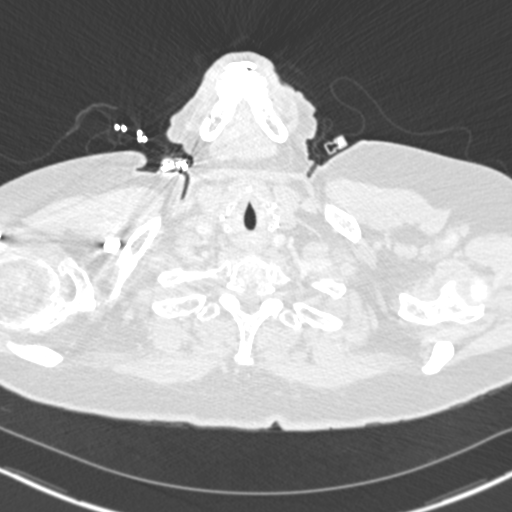

[Series 7: coronal mpr · coronal · 0.55mm/px · 1 of 139 slices shown]
[im 70/139  mediastinal]
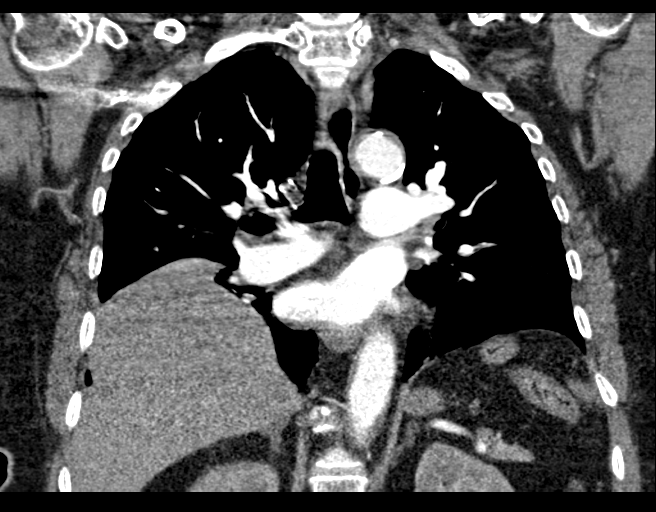

[18 of 36 positions shown; findings below may reference images not displayed]

FINDINGS: Cardiovascular: No filling defects in the pulmonary arteries to
suggest pulmonary emboli. Heart is mildly enlarged. Aortic and
coronary artery calcifications. No aneurysm.

Mediastinum/Nodes: No mediastinal, hilar, or axillary adenopathy.
Trachea and esophagus are unremarkable.

Lungs/Pleura: Small calcified granuloma peripherally in the left
upper lobe. Eventration of the right hemidiaphragm. Bibasilar
atelectasis. No effusions. No suspicious pulmonary nodules.

Upper Abdomen: Scattered low-density lesions in the liver and spleen
are stable since prior study, likely cysts. No acute findings.

Musculoskeletal: No acute bony abnormality.

Review of the MIP images confirms the above findings.
IMPRESSION: No evidence of pulmonary embolus.

Stable eventration of the right hemidiaphragm. Bibasilar
atelectasis.

No acute cardiopulmonary disease.

Coronary artery disease.

Aortic Atherosclerosis ([id number]).
